# Patient Record
Sex: Female | Born: 1940
Health system: Southern US, Community
[De-identification: ages and names within clinical notes are randomized; demographics above are authoritative.]

## PROBLEM LIST (undated history)

## (undated) DIAGNOSIS — J302 Other seasonal allergic rhinitis: Secondary | ICD-10-CM

## (undated) DIAGNOSIS — E119 Type 2 diabetes mellitus without complications: Secondary | ICD-10-CM

## (undated) DIAGNOSIS — J309 Allergic rhinitis, unspecified: Secondary | ICD-10-CM

## (undated) DIAGNOSIS — R011 Cardiac murmur, unspecified: Secondary | ICD-10-CM

## (undated) DIAGNOSIS — R7301 Impaired fasting glucose: Secondary | ICD-10-CM

## (undated) DIAGNOSIS — M159 Polyosteoarthritis, unspecified: Secondary | ICD-10-CM

## (undated) DIAGNOSIS — C801 Malignant (primary) neoplasm, unspecified: Secondary | ICD-10-CM

## (undated) DIAGNOSIS — I10 Essential (primary) hypertension: Secondary | ICD-10-CM

## (undated) DIAGNOSIS — E039 Hypothyroidism, unspecified: Secondary | ICD-10-CM

## (undated) DIAGNOSIS — Z9289 Personal history of other medical treatment: Secondary | ICD-10-CM

## (undated) DIAGNOSIS — K219 Gastro-esophageal reflux disease without esophagitis: Secondary | ICD-10-CM

## (undated) DIAGNOSIS — E785 Hyperlipidemia, unspecified: Secondary | ICD-10-CM

## (undated) HISTORY — PX: ESOPHAGOGASTRODUODENOSCOPY: SHX1529

## (undated) HISTORY — DX: Polyosteoarthritis, unspecified: M15.9

## (undated) HISTORY — PX: COLONOSCOPY: SHX174

## (undated) HISTORY — DX: Gastro-esophageal reflux disease without esophagitis: K21.9

## (undated) HISTORY — DX: Allergic rhinitis, unspecified: J30.9

## (undated) HISTORY — PX: OTHER SURGICAL HISTORY: SHX169

## (undated) HISTORY — DX: Hyperlipidemia, unspecified: E78.5

## (undated) HISTORY — DX: Impaired fasting glucose: R73.01

## (undated) HISTORY — DX: Hypothyroidism, unspecified: E03.9

## (undated) HISTORY — PX: BREAST EXCISIONAL BIOPSY: SUR124

## (undated) HISTORY — DX: Essential (primary) hypertension: I10

---
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
## 1979-04-05 HISTORY — PX: ABDOMINAL HYSTERECTOMY: SHX81

## 1983-04-05 HISTORY — PX: BREAST SURGERY: SHX581

## 2004-04-07 ENCOUNTER — Ambulatory Visit: Payer: Self-pay | Admitting: Unknown Physician Specialty

## 2004-10-12 ENCOUNTER — Ambulatory Visit: Payer: Self-pay | Admitting: Internal Medicine

## 2005-12-01 ENCOUNTER — Ambulatory Visit: Payer: Self-pay | Admitting: Internal Medicine

## 2005-12-12 ENCOUNTER — Ambulatory Visit: Payer: Self-pay | Admitting: Internal Medicine

## 2006-05-25 ENCOUNTER — Ambulatory Visit: Payer: Self-pay | Admitting: Internal Medicine

## 2007-05-07 ENCOUNTER — Ambulatory Visit: Payer: Self-pay | Admitting: Internal Medicine

## 2008-06-10 ENCOUNTER — Ambulatory Visit: Payer: Self-pay | Admitting: Internal Medicine

## 2009-07-09 ENCOUNTER — Ambulatory Visit: Payer: Self-pay | Admitting: Internal Medicine

## 2010-06-28 ENCOUNTER — Ambulatory Visit: Payer: Self-pay | Admitting: Unknown Physician Specialty

## 2010-06-30 LAB — PATHOLOGY REPORT

## 2010-07-13 ENCOUNTER — Ambulatory Visit: Payer: Self-pay | Admitting: Internal Medicine

## 2011-01-03 LAB — HM MAMMOGRAPHY: HM Mammogram: NORMAL

## 2011-01-20 LAB — HM COLONOSCOPY: HM Colonoscopy: NORMAL

## 2011-04-11 DIAGNOSIS — J209 Acute bronchitis, unspecified: Secondary | ICD-10-CM | POA: Diagnosis not present

## 2011-05-23 ENCOUNTER — Encounter: Payer: Self-pay | Admitting: Internal Medicine

## 2011-05-23 ENCOUNTER — Ambulatory Visit (INDEPENDENT_AMBULATORY_CARE_PROVIDER_SITE_OTHER): Payer: Medicare Other | Admitting: Internal Medicine

## 2011-05-23 VITALS — BP 120/70 | HR 65 | Temp 97.8°F | Ht 64.5 in | Wt 184.0 lb

## 2011-05-23 DIAGNOSIS — E785 Hyperlipidemia, unspecified: Secondary | ICD-10-CM

## 2011-05-23 DIAGNOSIS — I10 Essential (primary) hypertension: Secondary | ICD-10-CM | POA: Diagnosis not present

## 2011-05-23 DIAGNOSIS — K219 Gastro-esophageal reflux disease without esophagitis: Secondary | ICD-10-CM

## 2011-05-23 DIAGNOSIS — E039 Hypothyroidism, unspecified: Secondary | ICD-10-CM | POA: Insufficient documentation

## 2011-05-23 DIAGNOSIS — R7301 Impaired fasting glucose: Secondary | ICD-10-CM | POA: Diagnosis not present

## 2011-05-23 DIAGNOSIS — J309 Allergic rhinitis, unspecified: Secondary | ICD-10-CM | POA: Insufficient documentation

## 2011-05-23 DIAGNOSIS — Z23 Encounter for immunization: Secondary | ICD-10-CM

## 2011-05-23 DIAGNOSIS — R7303 Prediabetes: Secondary | ICD-10-CM | POA: Insufficient documentation

## 2011-05-23 DIAGNOSIS — M159 Polyosteoarthritis, unspecified: Secondary | ICD-10-CM | POA: Insufficient documentation

## 2011-05-23 MED ORDER — LEVOCETIRIZINE DIHYDROCHLORIDE 5 MG PO TABS: 5.0000 mg | ORAL_TABLET | Freq: Every evening | ORAL | Status: DC

## 2011-05-23 MED ORDER — LEVOTHYROXINE SODIUM 75 MCG PO TABS: 75.0000 ug | ORAL_TABLET | Freq: Every day | ORAL | Status: DC

## 2011-05-23 MED ORDER — HYDROCHLOROTHIAZIDE 25 MG PO TABS: 25.0000 mg | ORAL_TABLET | Freq: Every day | ORAL | Status: DC

## 2011-05-23 MED ORDER — BENZONATATE 100 MG PO CAPS: 100.0000 mg | ORAL_CAPSULE | Freq: Three times a day (TID) | ORAL | Status: DC | PRN

## 2011-05-23 MED ORDER — METFORMIN HCL 500 MG PO TABS: 500.0000 mg | ORAL_TABLET | Freq: Two times a day (BID) | ORAL | Status: DC

## 2011-05-23 MED ORDER — ATORVASTATIN CALCIUM 80 MG PO TABS: 80.0000 mg | ORAL_TABLET | Freq: Every day | ORAL | Status: DC

## 2011-05-23 MED ORDER — OMEPRAZOLE 40 MG PO CPDR: 40.0000 mg | DELAYED_RELEASE_CAPSULE | Freq: Every day | ORAL | Status: DC

## 2011-05-23 MED ORDER — VALSARTAN 320 MG PO TABS: 320.0000 mg | ORAL_TABLET | Freq: Every day | ORAL | Status: DC

## 2011-05-23 NOTE — Assessment & Plan Note (Signed)
Feels the aleve does more Will stop the meloxicam

## 2011-05-23 NOTE — Assessment & Plan Note (Signed)
Does okay on the med Worse last year so should continue

## 2011-05-23 NOTE — Assessment & Plan Note (Signed)
Tolerates the med Will check labs

## 2011-05-23 NOTE — Progress Notes (Signed)
Subjective:    Patient ID: Sandra Montgomery, female    DOB: 03-Sep-1940, 71 y.o.   MRN: [id number]  HPI Transferring care from Dr Dario Guardian  Has had long standing hypertension Does fine on the meds  High cholesterol as well Tolerates the med okay---does note some arm weakness and pain May have some degree of arthritis as well----takes the meloxicam every day though didn't know what it was for Goes to chiropractor at times Knees and shoulders mainly for pain  Allergy issues Controlled with daily med  Prone to bronchitis Never smoker No persistence to suggest chronic disease Uses benzonatate for cough  Takes thyroid med Believes it is underactive but not sure  Ongoing reflux Worsened recently Had EGD and colon by Dr Mechele Collin which was okay  No current outpatient prescriptions on file prior to visit.    No Known Allergies  Past Medical History  Diagnosis Date  . Hypertension   . Hyperlipidemia   . Allergic rhinitis, cause unspecified   . Unspecified hypothyroidism   . Impaired fasting glucose   . Osteoarthrosis involving, or with mention of more than one site, but not specified as generalized, multiple sites   . GERD (gastroesophageal reflux disease)   . Pneumothorax 1965    prolonged bed rest     Past Surgical History  Procedure Date  . Tonsillectomy and adenoidectomy 1961  . Abdominal hysterectomy 1981  . Breast surgery 1985    non cancerous mass removed    Family History  Problem Relation Age of Onset  . Alzheimer's disease Mother   . Hyperlipidemia Father   . Heart disease Father     History   Social History  . Marital Status: Married    Spouse Name: N/A    Number of Children: 2  . Years of Education: N/A   Occupational History  . BOOKKEEPER    Social History Main Topics  . Smoking status: Never Smoker   . Smokeless tobacco: Never Used  . Alcohol Use: No  . Drug Use: No  . Sexually Active: Not on file   Other Topics Concern  . Not on  file   Social History Narrative   Still works dailyNo living will Requests daughter Elnita Maxwell to be health care POA (along with husband)Would accept attempts at CPR but no prolonged ventilation. Would accept feeding tube temporarily   Review of Systems  Constitutional: Negative for fatigue and unexpected weight change.       Active at work but no exercise  HENT: Negative for hearing loss and tinnitus.   Eyes: Negative for visual disturbance.       Had left corneal repair last year Cataract surgery on hold Needs magnifying glass for reading  Respiratory: Negative for cough, chest tightness and shortness of breath.   Cardiovascular: Negative for chest pain and leg swelling.  Gastrointestinal: Negative for nausea, vomiting, abdominal pain and constipation.  Genitourinary: Positive for urgency. Negative for dysuria and difficulty urinating.       Uses pad due to mild urge incontinence  Musculoskeletal: Positive for arthralgias. Negative for back pain and joint swelling.  Neurological: Negative for dizziness, light-headedness and headaches.       Migraines in past---not a current problem  Hematological: Negative for adenopathy. Does not bruise/bleed easily.  Psychiatric/Behavioral: Negative for sleep disturbance and dysphoric mood. The patient is not nervous/anxious.        Objective:   Physical Exam  Constitutional: She appears well-developed and well-nourished. No distress.  HENT:  Mouth/Throat:  Oropharynx is clear and moist. No oropharyngeal exudate.  Eyes: Conjunctivae and EOM are normal. Pupils are equal, round, and reactive to light.  Neck: Normal range of motion. Neck supple. No thyromegaly present.  Cardiovascular: Normal rate, regular rhythm, normal heart sounds and intact distal pulses.  Exam reveals no gallop.   No murmur heard. Pulmonary/Chest: Effort normal and breath sounds normal. No respiratory distress. She has no wheezes. She has no rales.  Abdominal: Soft. There is no  tenderness.  Musculoskeletal: She exhibits no edema and no tenderness.  Lymphadenopathy:    She has no cervical adenopathy.  Skin: No rash noted. No erythema.  Psychiatric: She has a normal mood and affect. Her behavior is normal. Judgment and thought content normal.          Assessment & Plan:

## 2011-05-23 NOTE — Assessment & Plan Note (Signed)
BP Readings from Last 3 Encounters:  05/23/11 120/70   Good control Will continue current meds Will check labs

## 2011-05-23 NOTE — Patient Instructions (Signed)
Please get last 3 years of records/labs/consults and studies--from Dr Dario Guardian

## 2011-05-23 NOTE — Assessment & Plan Note (Signed)
She debates the diagnosis of diabetes States her sugar was up to 112 once and he put her on metformin Will check labs If normal, can consider stopping the metformin

## 2011-05-23 NOTE — Assessment & Plan Note (Signed)
Will check labs

## 2011-05-24 LAB — CBC WITH DIFFERENTIAL/PLATELET
Eosinophils Relative: 3 % (ref 0.0–5.0)
HCT: 40.9 % (ref 36.0–46.0)
Hemoglobin: 13.5 g/dL (ref 12.0–15.0)
Lymphs Abs: 2.5 10*3/uL (ref 0.7–4.0)
MCV: 89.8 fl (ref 78.0–100.0)
Monocytes Absolute: 0.5 10*3/uL (ref 0.1–1.0)
Monocytes Relative: 5.9 % (ref 3.0–12.0)
Neutro Abs: 4.9 10*3/uL (ref 1.4–7.7)
Platelets: 214 10*3/uL (ref 150.0–400.0)
WBC: 8.1 10*3/uL (ref 4.5–10.5)

## 2011-05-24 LAB — HEPATIC FUNCTION PANEL
ALT: 28 U/L (ref 0–35)
AST: 19 U/L (ref 0–37)
Albumin: 4.4 g/dL (ref 3.5–5.2)
Alkaline Phosphatase: 66 U/L (ref 39–117)
Total Protein: 6.9 g/dL (ref 6.0–8.3)

## 2011-05-24 LAB — LIPID PANEL
Cholesterol: 193 mg/dL (ref 0–200)
LDL Cholesterol: 112 mg/dL — ABNORMAL HIGH (ref 0–99)
Total CHOL/HDL Ratio: 3
Triglycerides: 121 mg/dL (ref 0.0–149.0)

## 2011-05-24 LAB — BASIC METABOLIC PANEL
BUN: 22 mg/dL (ref 6–23)
Chloride: 102 mEq/L (ref 96–112)
GFR: 56.45 mL/min — ABNORMAL LOW (ref >60.00)
Glucose, Bld: 80 mg/dL (ref 70–99)
Potassium: 4.2 mEq/L (ref 3.5–5.1)
Sodium: 139 mEq/L (ref 135–145)

## 2011-05-26 ENCOUNTER — Encounter: Payer: Self-pay | Admitting: *Deleted

## 2011-06-15 DIAGNOSIS — H179 Unspecified corneal scar and opacity: Secondary | ICD-10-CM | POA: Diagnosis not present

## 2011-06-20 ENCOUNTER — Other Ambulatory Visit: Payer: Self-pay | Admitting: Internal Medicine

## 2011-06-21 NOTE — Telephone Encounter (Signed)
rx sent to pharmacy by e-script Spoke with patient and advised results   

## 2011-06-21 NOTE — Telephone Encounter (Signed)
Okay to refill #90 x 0 Will need to discuss this at next visit---could be related to her BP med

## 2011-06-21 NOTE — Telephone Encounter (Signed)
Called pt to ask why she needs the refill on Benzonatate and per pt she takes this every morning and if she doesn't take it she can tell, her cough gets worse, per pt she's been taking benzonatate and the syrup(sometimes) for 2-3 years. Please advise if refill ok.

## 2011-09-07 DIAGNOSIS — J018 Other acute sinusitis: Secondary | ICD-10-CM | POA: Diagnosis not present

## 2011-10-01 ENCOUNTER — Other Ambulatory Visit: Payer: Self-pay | Admitting: Internal Medicine

## 2011-11-21 ENCOUNTER — Encounter: Payer: Self-pay | Admitting: Internal Medicine

## 2011-11-21 ENCOUNTER — Ambulatory Visit (INDEPENDENT_AMBULATORY_CARE_PROVIDER_SITE_OTHER): Payer: Medicare Other | Admitting: Internal Medicine

## 2011-11-21 VITALS — BP 120/88 | HR 82 | Temp 97.5°F | Ht 64.0 in | Wt 184.0 lb

## 2011-11-21 DIAGNOSIS — M159 Polyosteoarthritis, unspecified: Secondary | ICD-10-CM

## 2011-11-21 DIAGNOSIS — I10 Essential (primary) hypertension: Secondary | ICD-10-CM

## 2011-11-21 DIAGNOSIS — M810 Age-related osteoporosis without current pathological fracture: Secondary | ICD-10-CM | POA: Insufficient documentation

## 2011-11-21 DIAGNOSIS — E785 Hyperlipidemia, unspecified: Secondary | ICD-10-CM

## 2011-11-21 MED ORDER — BENZONATATE 100 MG PO CAPS: 100.0000 mg | ORAL_CAPSULE | Freq: Three times a day (TID) | ORAL | Status: DC | PRN

## 2011-11-21 NOTE — Assessment & Plan Note (Signed)
Uses the meloxicam daily PPI for stomach and gastroprotection

## 2011-11-21 NOTE — Progress Notes (Signed)
Subjective:    Patient ID: Sandra Montgomery, female    DOB: June 18, 1940, 71 y.o.   MRN: [id number]  HPI Doing okay  Still has allergies Chronic cough---does get relief from benzonatate Relates this to post nasal drip Affects her voice Uses the med every morning  No chest pain No SOB No dizziness or syncope No edema  Doing fine on the cholesterol med No myalgias  Discussed weight  Has done weight watchers and [facility] but not kept up with it Belongs to gym but hasn't been going Sedentary at work  Current Outpatient Prescriptions on File Prior to Visit  Medication Sig Dispense Refill  . aspirin 81 MG tablet Take 81 mg by mouth daily.      Marland Kitchen atorvastatin (LIPITOR) 80 MG tablet Take 1 tablet (80 mg total) by mouth daily.  30 tablet  11  . benzonatate (TESSALON) 100 MG capsule TAKE ONE CAPSULE BY MOUTH 3 TIMES A DAY AS NEEDED FOR COUGH  90 capsule  0  . hydrochlorothiazide (HYDRODIURIL) 25 MG tablet Take 1 tablet (25 mg total) by mouth daily.  30 tablet  11  . ibandronate (BONIVA) 150 MG tablet TAKE 1 TABLET BY MOUTH ONCE EVERY MONTH  3 tablet  3  . levocetirizine (XYZAL) 5 MG tablet Take 1 tablet (5 mg total) by mouth every evening.  30 tablet  11  . levothyroxine (SYNTHROID, LEVOTHROID) 75 MCG tablet Take 1 tablet (75 mcg total) by mouth daily.  30 tablet  11  . metFORMIN (GLUCOPHAGE) 500 MG tablet Take 1 tablet (500 mg total) by mouth 2 (two) times daily with a meal.  60 tablet  11  . naproxen sodium (ANAPROX) 220 MG tablet Take 220 mg by mouth 2 (two) times daily with a meal.      . omeprazole (PRILOSEC) 40 MG capsule Take 1 capsule (40 mg total) by mouth daily.  30 capsule  11  . valsartan (DIOVAN) 320 MG tablet Take 1 tablet (320 mg total) by mouth daily.  30 tablet  11    No Known Allergies  Past Medical History  Diagnosis Date  . Hypertension   . Hyperlipidemia   . Allergic rhinitis, cause unspecified   . Unspecified hypothyroidism   . Impaired fasting glucose    . Osteoarthrosis involving, or with mention of more than one site, but not specified as generalized, multiple sites   . GERD (gastroesophageal reflux disease)   . Pneumothorax 1965    prolonged bed rest     Past Surgical History  Procedure Date  . Tonsillectomy and adenoidectomy 1961  . Abdominal hysterectomy 1981  . Breast surgery 1985    non cancerous mass removed    Family History  Problem Relation Age of Onset  . Alzheimer's disease Mother   . Hyperlipidemia Father   . Heart disease Father     History   Social History  . Marital Status: Married    Spouse Name: N/A    Number of Children: 2  . Years of Education: N/A   Occupational History  . BOOKKEEPER    Social History Main Topics  . Smoking status: Never Smoker   . Smokeless tobacco: Never Used  . Alcohol Use: No  . Drug Use: No  . Sexually Active: Not on file   Other Topics Concern  . Not on file   Social History Narrative   Still works dailyNo living will Requests daughter Elnita Maxwell to be health care POA (along with husband)Would accept attempts  at CPR but no prolonged ventilation. Would accept feeding tube temporarily   Review of Systems Weight is stable Sleeps variably----initiates fine. Some trouble maintaining    Objective:   Physical Exam  Constitutional: She appears well-developed and well-nourished. No distress.  Neck: Normal range of motion. Neck supple. No thyromegaly present.  Cardiovascular: Normal rate, regular rhythm, normal heart sounds and intact distal pulses.  Exam reveals no gallop.   No murmur heard. Pulmonary/Chest: Effort normal and breath sounds normal. No respiratory distress. She has no wheezes. She has no rales.  Musculoskeletal: She exhibits no edema and no tenderness.  Lymphadenopathy:    She has no cervical adenopathy.  Psychiatric: She has a normal mood and affect. Her behavior is normal. Thought content normal.          Assessment & Plan:

## 2011-11-21 NOTE — Assessment & Plan Note (Signed)
Close to goal No changes for now Discussed fitness  Lab Results  Component Value Date   LDLCALC 112* 05/23/2011

## 2011-11-21 NOTE — Assessment & Plan Note (Signed)
BP Readings from Last 3 Encounters:  11/21/11 120/88  05/23/11 120/70   Good control No changes needed Lab Results  Component Value Date   CREATININE 1.0 05/23/2011

## 2012-01-16 DIAGNOSIS — L821 Other seborrheic keratosis: Secondary | ICD-10-CM | POA: Diagnosis not present

## 2012-01-16 DIAGNOSIS — L909 Atrophic disorder of skin, unspecified: Secondary | ICD-10-CM | POA: Diagnosis not present

## 2012-01-16 DIAGNOSIS — L919 Hypertrophic disorder of the skin, unspecified: Secondary | ICD-10-CM | POA: Diagnosis not present

## 2012-01-16 DIAGNOSIS — L820 Inflamed seborrheic keratosis: Secondary | ICD-10-CM | POA: Diagnosis not present

## 2012-01-18 ENCOUNTER — Ambulatory Visit (INDEPENDENT_AMBULATORY_CARE_PROVIDER_SITE_OTHER): Payer: Medicare Other

## 2012-01-18 DIAGNOSIS — Z23 Encounter for immunization: Secondary | ICD-10-CM

## 2012-01-23 ENCOUNTER — Other Ambulatory Visit: Payer: Self-pay | Admitting: Internal Medicine

## 2012-01-26 ENCOUNTER — Other Ambulatory Visit: Payer: Self-pay | Admitting: *Deleted

## 2012-01-26 MED ORDER — ATORVASTATIN CALCIUM 80 MG PO TABS: 80.0000 mg | ORAL_TABLET | Freq: Every day | ORAL | Status: DC

## 2012-02-08 DIAGNOSIS — H179 Unspecified corneal scar and opacity: Secondary | ICD-10-CM | POA: Diagnosis not present

## 2012-02-18 DIAGNOSIS — R059 Cough, unspecified: Secondary | ICD-10-CM | POA: Diagnosis not present

## 2012-02-18 DIAGNOSIS — J309 Allergic rhinitis, unspecified: Secondary | ICD-10-CM | POA: Diagnosis not present

## 2012-02-18 DIAGNOSIS — R05 Cough: Secondary | ICD-10-CM | POA: Diagnosis not present

## 2012-02-18 DIAGNOSIS — J209 Acute bronchitis, unspecified: Secondary | ICD-10-CM | POA: Diagnosis not present

## 2012-02-20 DIAGNOSIS — L820 Inflamed seborrheic keratosis: Secondary | ICD-10-CM | POA: Diagnosis not present

## 2012-02-20 DIAGNOSIS — L821 Other seborrheic keratosis: Secondary | ICD-10-CM | POA: Diagnosis not present

## 2012-04-02 DIAGNOSIS — L821 Other seborrheic keratosis: Secondary | ICD-10-CM | POA: Diagnosis not present

## 2012-04-02 DIAGNOSIS — Z85828 Personal history of other malignant neoplasm of skin: Secondary | ICD-10-CM | POA: Diagnosis not present

## 2012-04-02 DIAGNOSIS — L820 Inflamed seborrheic keratosis: Secondary | ICD-10-CM | POA: Diagnosis not present

## 2012-05-22 ENCOUNTER — Other Ambulatory Visit: Payer: Self-pay | Admitting: *Deleted

## 2012-05-22 MED ORDER — LEVOTHYROXINE SODIUM 75 MCG PO TABS: 75.0000 ug | ORAL_TABLET | Freq: Every day | ORAL | Status: DC

## 2012-05-22 MED ORDER — LEVOCETIRIZINE DIHYDROCHLORIDE 5 MG PO TABS: 5.0000 mg | ORAL_TABLET | Freq: Every evening | ORAL | Status: DC

## 2012-05-22 NOTE — Addendum Note (Signed)
Addended by: Baldomero Lamy on: 05/22/2012 11:36 AM   Modules accepted: Orders

## 2012-06-01 ENCOUNTER — Encounter: Payer: Medicare Other | Admitting: Internal Medicine

## 2012-06-08 ENCOUNTER — Encounter: Payer: Medicare Other | Admitting: Internal Medicine

## 2012-06-14 ENCOUNTER — Other Ambulatory Visit: Payer: Self-pay | Admitting: Internal Medicine

## 2012-07-17 ENCOUNTER — Other Ambulatory Visit: Payer: Self-pay | Admitting: Internal Medicine

## 2012-07-17 DIAGNOSIS — M999 Biomechanical lesion, unspecified: Secondary | ICD-10-CM | POA: Diagnosis not present

## 2012-07-17 DIAGNOSIS — IMO0002 Reserved for concepts with insufficient information to code with codable children: Secondary | ICD-10-CM | POA: Diagnosis not present

## 2012-07-17 DIAGNOSIS — M5137 Other intervertebral disc degeneration, lumbosacral region: Secondary | ICD-10-CM | POA: Diagnosis not present

## 2012-07-27 ENCOUNTER — Ambulatory Visit (INDEPENDENT_AMBULATORY_CARE_PROVIDER_SITE_OTHER): Payer: Medicare Other | Admitting: Internal Medicine

## 2012-07-27 ENCOUNTER — Encounter: Payer: Self-pay | Admitting: Internal Medicine

## 2012-07-27 VITALS — BP 110/80 | HR 74 | Temp 98.1°F | Wt 188.0 lb

## 2012-07-27 DIAGNOSIS — R7301 Impaired fasting glucose: Secondary | ICD-10-CM | POA: Diagnosis not present

## 2012-07-27 DIAGNOSIS — R413 Other amnesia: Secondary | ICD-10-CM

## 2012-07-27 DIAGNOSIS — E785 Hyperlipidemia, unspecified: Secondary | ICD-10-CM | POA: Diagnosis not present

## 2012-07-27 DIAGNOSIS — Z0000 Encounter for general adult medical examination without abnormal findings: Secondary | ICD-10-CM | POA: Diagnosis not present

## 2012-07-27 DIAGNOSIS — E039 Hypothyroidism, unspecified: Secondary | ICD-10-CM | POA: Diagnosis not present

## 2012-07-27 DIAGNOSIS — I10 Essential (primary) hypertension: Secondary | ICD-10-CM | POA: Diagnosis not present

## 2012-07-27 DIAGNOSIS — M159 Polyosteoarthritis, unspecified: Secondary | ICD-10-CM

## 2012-07-27 DIAGNOSIS — M810 Age-related osteoporosis without current pathological fracture: Secondary | ICD-10-CM

## 2012-07-27 LAB — CBC WITH DIFFERENTIAL/PLATELET
Basophils Relative: 1 % (ref 0–1)
Eosinophils Absolute: 0.3 10*3/uL (ref 0.0–0.7)
Eosinophils Relative: 3 % (ref 0–5)
Lymphs Abs: 2.6 10*3/uL (ref 0.7–4.0)
MCH: 28.7 pg (ref 26.0–34.0)
MCHC: 34 g/dL (ref 30.0–36.0)
MCV: 84.5 fL (ref 78.0–100.0)
Monocytes Relative: 8 % (ref 3–12)
Neutrophils Relative %: 58 % (ref 43–77)
Platelets: 266 10*3/uL (ref 150–400)
RBC: 4.7 MIL/uL (ref 3.87–5.11)

## 2012-07-27 NOTE — Assessment & Plan Note (Signed)
Lab Results  Component Value Date   HGBA1C 6.4 05/23/2011   Will recheck  Consider stopping the metformin

## 2012-07-27 NOTE — Assessment & Plan Note (Signed)
On 2 different NSAIDs at low dose Discussed changing to just one--she isn't excited about it

## 2012-07-27 NOTE — Assessment & Plan Note (Addendum)
On boniva for about 3 years or so Will plan to stop in 2016 Needs to start vitamin D

## 2012-07-27 NOTE — Assessment & Plan Note (Signed)
BP Readings from Last 3 Encounters:  07/27/12 110/80  11/21/11 120/88  05/23/11 120/70   Good control No changes needed

## 2012-07-27 NOTE — Patient Instructions (Addendum)
Please set up your mammogram at [facility] Please start vitamin D 1000 int units daily Please try meloxicam 15mg  daily OR 2 aleve twice a day alone---without the other. See how that works.  DASH Diet The DASH diet stands for "Dietary Approaches to Stop Hypertension." It is a healthy eating plan that has been shown to reduce high blood pressure (hypertension) in as little as 14 days, while also possibly providing other significant health benefits. These other health benefits include reducing the risk of breast cancer after menopause and reducing the risk of type 2 diabetes, heart disease, colon cancer, and stroke. Health benefits also include weight loss and slowing kidney failure in patients with chronic kidney disease.  DIET GUIDELINES  Limit salt (sodium). Your diet should contain less than 1500 mg of sodium daily.  Limit refined or processed carbohydrates. Your diet should include mostly whole grains. Desserts and added sugars should be used sparingly.  Include small amounts of heart-healthy fats. These types of fats include nuts, oils, and tub margarine. Limit saturated and trans fats. These fats have been shown to be harmful in the body. CHOOSING FOODS  The following food groups are based on a 2000 calorie diet. See your Registered Dietitian for individual calorie needs. Grains and Grain Products (6 to 8 servings daily)  Eat More Often: Whole-wheat bread, brown rice, whole-grain or wheat pasta, quinoa, popcorn without added fat or salt (air popped).  Eat Less Often: White bread, white pasta, white rice, cornbread. Vegetables (4 to 5 servings daily)  Eat More Often: Fresh, frozen, and canned vegetables. Vegetables may be raw, steamed, roasted, or grilled with a minimal amount of fat.  Eat Less Often/Avoid: Creamed or fried vegetables. Vegetables in a cheese sauce. Fruit (4 to 5 servings daily)  Eat More Often: All fresh, canned (in natural juice), or frozen fruits. Dried fruits without added  sugar. One hundred percent fruit juice ( cup [237 mL] daily).  Eat Less Often: Dried fruits with added sugar. Canned fruit in light or heavy syrup. [employer], Fish, and Poultry (2 servings or less daily. One serving is 3 to 4 oz [85-114 g]).  Eat More Often: Ninety percent or leaner ground beef, tenderloin, sirloin. Round cuts of beef, chicken breast, [place] breast. All fish. Grill, bake, or broil your meat. Nothing should be fried.  Eat Less Often/Avoid: Fatty cuts of meat, [place], or chicken leg, thigh, or wing. Fried cuts of meat or fish. Dairy (2 to 3 servings)  Eat More Often: Low-fat or fat-free milk, low-fat plain or light yogurt, reduced-fat or part-skim cheese.  Eat Less Often/Avoid: Milk (whole, 2%).Whole milk yogurt. Full-fat cheeses. Nuts, Seeds, and Legumes (4 to 5 servings per week)  Eat More Often: All without added salt.  Eat Less Often/Avoid: Salted nuts and seeds, canned beans with added salt. Fats and Sweets (limited)  Eat More Often: Vegetable oils, tub margarines without trans fats, sugar-free gelatin. Mayonnaise and salad dressings.  Eat Less Often/Avoid: Coconut oils, palm oils, butter, stick margarine, cream, half and half, cookies, candy, pie. FOR MORE INFORMATION The Dash Diet Eating Plan: www.dashdiet.org Document Released: 03/10/2011 Document Revised: 06/13/2011 Document Reviewed: 03/10/2011 [facility] Patient Information 2013 [place], [place].

## 2012-07-27 NOTE — Assessment & Plan Note (Signed)
Due for labs Seems euthyroid 

## 2012-07-27 NOTE — Assessment & Plan Note (Signed)
I have personally reviewed the Medicare Annual Wellness questionnaire and have noted 1. The patient's medical and social history 2. Their use of alcohol, tobacco or illicit drugs 3. Their current medications and supplements 4. The patient's functional ability including ADL's, fall risks, home safety risks and hearing or visual             impairment. 5. Diet and physical activities 6. Evidence for depression or mood disorders  The patients weight, height, BMI and visual acuity have been recorded in the chart I have made referrals, counseling and provided education to the patient based review of the above and I have provided the pt with a written personalized care plan for preventive services.  I have provided you with a copy of your personalized plan for preventive services. Please take the time to review along with your updated medication list.  Mild memory issues---we will follow this over time Check labs including B12 Rx for zostavax She will set up the mammogram

## 2012-07-27 NOTE — Progress Notes (Signed)
Subjective:    Patient ID: Sandra Montgomery, female    DOB: 14-Dec-1940, 72 y.o.   MRN: [id number]  HPI Here for Medicare wellness and follow up visit Reviewed advanced directives No falls No depression or anhedonia Some worsening of vision---corneal damage and growing cataract on left. Does okay with both together Hearing okay No real exercise No tobacco or alcohol No other doctors except episodic chiropractor Mild memory changes but no functional problems  Doesn't check sugars Has kept up on the metformin Weight is stable  Ongoing arthritis pain Knee is worst Sees chiropractor  Takes lower dose of meloxicam and naproxen together  No chest pain  Decreased respiratory reserve due to past pneumothorax Limited exercise tolerance without change No dizziness or syncope  Still on omeprazole No heartburn No swallowing problems  Same thyroid med  Current Outpatient Prescriptions on File Prior to Visit  Medication Sig Dispense Refill  . aspirin 81 MG tablet Take 81 mg by mouth daily.      Marland Kitchen atorvastatin (LIPITOR) 80 MG tablet Take 1 tablet (80 mg total) by mouth daily.  90 tablet  1  . benzonatate (TESSALON) 100 MG capsule Take 1 capsule (100 mg total) by mouth 3 (three) times daily as needed for cough.  90 capsule  1  . DIOVAN 320 MG tablet TAKE 1 TABLET BY MOUTH EVERY DAY  30 tablet  0  . hydrochlorothiazide (HYDRODIURIL) 25 MG tablet TAKE 1 TABLET BY MOUTH EVERY DAY  30 tablet  0  . ibandronate (BONIVA) 150 MG tablet TAKE 1 TABLET BY MOUTH ONCE EVERY MONTH  3 tablet  3  . levocetirizine (XYZAL) 5 MG tablet Take 1 tablet (5 mg total) by mouth every evening.  30 tablet  5  . levothyroxine (SYNTHROID, LEVOTHROID) 75 MCG tablet TAKE 1 TABLET BY MOUTH ONCE A DAY  30 tablet  0  . meloxicam (MOBIC) 7.5 MG tablet Take 7.5 mg by mouth daily.      . metFORMIN (GLUCOPHAGE) 500 MG tablet TAKE 1 TABLET BY MOUTH TWICE A DAY WITH MEALS  60 tablet  0  . naproxen sodium (ANAPROX) 220 MG  tablet Take 220 mg by mouth 2 (two) times daily with a meal.      . omeprazole (PRILOSEC) 40 MG capsule TAKE ONE CAPSULE BY MOUTH EVERY DAY  30 capsule  0   No current facility-administered medications on file prior to visit.    No Known Allergies  Past Medical History  Diagnosis Date  . Hypertension   . Hyperlipidemia   . Allergic rhinitis, cause unspecified   . Unspecified hypothyroidism   . Impaired fasting glucose   . Osteoarthrosis involving, or with mention of more than one site, but not specified as generalized, multiple sites   . GERD (gastroesophageal reflux disease)   . Pneumothorax 1965    prolonged bed rest   . Osteoporosis     Past Surgical History  Procedure Laterality Date  . Tonsillectomy and adenoidectomy  1961  . Abdominal hysterectomy  1981  . Breast surgery  1985    non cancerous mass removed    Family History  Problem Relation Age of Onset  . Alzheimer's disease Mother   . Hyperlipidemia Father   . Heart disease Father     History   Social History  . Marital Status: Married    Spouse Name: N/A    Number of Children: 2  . Years of Education: N/A   Occupational History  . [employer]   Social History Main Topics  . Smoking status: Never Smoker   . Smokeless tobacco: Never Used  . Alcohol Use: No  . Drug Use: No  . Sexually Active: Not on file   Other Topics Concern  . Not on file   Social History Narrative   Still works daily   No living will    Requests daughter Elnita Maxwell to be health care POA (along with husband)   Would accept attempts at CPR but no prolonged ventilation.    Would accept feeding tube temporarily   Review of Systems Tries to be careful with eating Weight up 4# since last visit Sleeps well Nocturia x 1 usually Bowels are fine No dysuria but goes frequently. No incontinence if keeps bladder empty. Wears pad just in case.    Objective:   Physical Exam  Constitutional: She is  oriented to person, place, and time. She appears well-developed and well-nourished. No distress.  HENT:  Mouth/Throat: Oropharynx is clear and moist. No oropharyngeal exudate.  Neck: Normal range of motion. Neck supple. No thyromegaly present.  Cardiovascular: Normal rate, regular rhythm, normal heart sounds and intact distal pulses.  Exam reveals no gallop.   No murmur heard. Pulmonary/Chest: Effort normal and breath sounds normal. No respiratory distress. She has no wheezes. She has no rales.  Abdominal: Soft. There is no tenderness.  Musculoskeletal: She exhibits no edema and no tenderness.  Lymphadenopathy:    She has no cervical adenopathy.  Neurological: She is alert and oriented to person, place, and time.  President'-- "Obama, Lura Em" [phone] [id number] Recall 0/3  Skin: No rash noted. No erythema.  Psychiatric: She has a normal mood and affect. Her behavior is normal.          Assessment & Plan:

## 2012-07-27 NOTE — Assessment & Plan Note (Signed)
No problems with the med Will check labs 

## 2012-07-28 LAB — LIPID PANEL
Cholesterol: 190 mg/dL (ref 0–200)
HDL: 46 mg/dL (ref >39)
LDL Cholesterol: 112 mg/dL — ABNORMAL HIGH (ref 0–99)
Triglycerides: 158 mg/dL — ABNORMAL HIGH (ref <150)

## 2012-07-28 LAB — HEPATIC FUNCTION PANEL
ALT: 27 U/L (ref 0–35)
Albumin: 4.3 g/dL (ref 3.5–5.2)
Alkaline Phosphatase: 78 U/L (ref 39–117)
Indirect Bilirubin: 0.5 mg/dL (ref 0.0–0.9)
Total Protein: 6.8 g/dL (ref 6.0–8.3)

## 2012-07-28 LAB — BASIC METABOLIC PANEL
BUN: 21 mg/dL (ref 6–23)
CO2: 29 mEq/L (ref 19–32)
Chloride: 102 mEq/L (ref 96–112)
Creat: 1.09 mg/dL (ref 0.50–1.10)
Potassium: 4.8 mEq/L (ref 3.5–5.3)

## 2012-08-01 ENCOUNTER — Other Ambulatory Visit: Payer: Self-pay

## 2012-08-01 MED ORDER — HYDROCHLOROTHIAZIDE 25 MG PO TABS: ORAL_TABLET | ORAL | Status: DC

## 2012-08-01 MED ORDER — LEVOTHYROXINE SODIUM 75 MCG PO TABS: ORAL_TABLET | ORAL | Status: DC

## 2012-08-01 MED ORDER — LEVOCETIRIZINE DIHYDROCHLORIDE 5 MG PO TABS: 5.0000 mg | ORAL_TABLET | Freq: Every evening | ORAL | Status: DC

## 2012-08-01 MED ORDER — OMEPRAZOLE 40 MG PO CPDR: DELAYED_RELEASE_CAPSULE | ORAL | Status: DC

## 2012-08-01 MED ORDER — MELOXICAM 15 MG PO TABS: 15.0000 mg | ORAL_TABLET | Freq: Every day | ORAL | Status: DC

## 2012-08-01 MED ORDER — BENZONATATE 100 MG PO CAPS: 100.0000 mg | ORAL_CAPSULE | Freq: Three times a day (TID) | ORAL | Status: DC | PRN

## 2012-08-01 MED ORDER — ATORVASTATIN CALCIUM 80 MG PO TABS: 80.0000 mg | ORAL_TABLET | Freq: Every day | ORAL | Status: DC

## 2012-08-01 MED ORDER — METFORMIN HCL 500 MG PO TABS: ORAL_TABLET | ORAL | Status: DC

## 2012-08-01 MED ORDER — IBANDRONATE SODIUM 150 MG PO TABS: 150.0000 mg | ORAL_TABLET | ORAL | Status: DC

## 2012-08-01 MED ORDER — VALSARTAN 320 MG PO TABS: ORAL_TABLET | ORAL | Status: DC

## 2012-08-01 NOTE — Telephone Encounter (Signed)
I did tell her to try off the metformin. If her sugars go up off (like consistently over 120 fasting), she should resume. Okay benzonatate #90 x 0---should be used prn I have recommended she take the meloxicam or naproxen but not both Dose should be 15mg  daily if meloxicam or naproxen 220mg  2 tabs bid (okay x 1 year for whichever she prefers)

## 2012-08-01 NOTE — Telephone Encounter (Signed)
Pt request 90 day refills on [facility]; pt has not stopped metformin and wants to clarify with Dr Alphonsus Sias if should stop metformin. Please advise.

## 2012-08-01 NOTE — Telephone Encounter (Signed)
Spoke with patient and advised results rx sent to pharmacy by e-script Patient will take the mobic and not the naproxen.

## 2012-08-20 ENCOUNTER — Other Ambulatory Visit: Payer: Self-pay | Admitting: Internal Medicine

## 2012-09-08 ENCOUNTER — Other Ambulatory Visit: Payer: Self-pay | Admitting: Internal Medicine

## 2012-10-15 ENCOUNTER — Other Ambulatory Visit: Payer: Self-pay | Admitting: Internal Medicine

## 2012-11-22 DIAGNOSIS — H40009 Preglaucoma, unspecified, unspecified eye: Secondary | ICD-10-CM | POA: Diagnosis not present

## 2012-12-12 ENCOUNTER — Other Ambulatory Visit: Payer: Self-pay | Admitting: Internal Medicine

## 2013-01-04 DIAGNOSIS — M6200 Separation of muscle (nontraumatic), unspecified site: Secondary | ICD-10-CM | POA: Diagnosis not present

## 2013-01-04 DIAGNOSIS — K449 Diaphragmatic hernia without obstruction or gangrene: Secondary | ICD-10-CM | POA: Diagnosis not present

## 2013-01-04 DIAGNOSIS — M549 Dorsalgia, unspecified: Secondary | ICD-10-CM | POA: Diagnosis not present

## 2013-01-28 ENCOUNTER — Other Ambulatory Visit: Payer: Self-pay | Admitting: Internal Medicine

## 2013-01-28 NOTE — Telephone Encounter (Signed)
Ok to fill 

## 2013-01-29 NOTE — Telephone Encounter (Signed)
rx sent to pharmacy by e-script  

## 2013-01-29 NOTE — Telephone Encounter (Signed)
Okay #90 x 0 

## 2013-02-07 ENCOUNTER — Other Ambulatory Visit: Payer: Self-pay

## 2013-03-11 DIAGNOSIS — M999 Biomechanical lesion, unspecified: Secondary | ICD-10-CM | POA: Diagnosis not present

## 2013-03-11 DIAGNOSIS — IMO0002 Reserved for concepts with insufficient information to code with codable children: Secondary | ICD-10-CM | POA: Diagnosis not present

## 2013-03-11 DIAGNOSIS — M9981 Other biomechanical lesions of cervical region: Secondary | ICD-10-CM | POA: Diagnosis not present

## 2013-03-11 DIAGNOSIS — M5030 Other cervical disc degeneration, unspecified cervical region: Secondary | ICD-10-CM | POA: Diagnosis not present

## 2013-03-15 DIAGNOSIS — IMO0002 Reserved for concepts with insufficient information to code with codable children: Secondary | ICD-10-CM | POA: Diagnosis not present

## 2013-03-15 DIAGNOSIS — M999 Biomechanical lesion, unspecified: Secondary | ICD-10-CM | POA: Diagnosis not present

## 2013-03-15 DIAGNOSIS — M5030 Other cervical disc degeneration, unspecified cervical region: Secondary | ICD-10-CM | POA: Diagnosis not present

## 2013-03-15 DIAGNOSIS — M9981 Other biomechanical lesions of cervical region: Secondary | ICD-10-CM | POA: Diagnosis not present

## 2013-03-19 DIAGNOSIS — M9981 Other biomechanical lesions of cervical region: Secondary | ICD-10-CM | POA: Diagnosis not present

## 2013-03-19 DIAGNOSIS — M999 Biomechanical lesion, unspecified: Secondary | ICD-10-CM | POA: Diagnosis not present

## 2013-03-19 DIAGNOSIS — M5030 Other cervical disc degeneration, unspecified cervical region: Secondary | ICD-10-CM | POA: Diagnosis not present

## 2013-03-19 DIAGNOSIS — IMO0002 Reserved for concepts with insufficient information to code with codable children: Secondary | ICD-10-CM | POA: Diagnosis not present

## 2013-03-22 DIAGNOSIS — IMO0002 Reserved for concepts with insufficient information to code with codable children: Secondary | ICD-10-CM | POA: Diagnosis not present

## 2013-03-22 DIAGNOSIS — M999 Biomechanical lesion, unspecified: Secondary | ICD-10-CM | POA: Diagnosis not present

## 2013-03-22 DIAGNOSIS — M9981 Other biomechanical lesions of cervical region: Secondary | ICD-10-CM | POA: Diagnosis not present

## 2013-03-22 DIAGNOSIS — M5030 Other cervical disc degeneration, unspecified cervical region: Secondary | ICD-10-CM | POA: Diagnosis not present

## 2013-04-09 ENCOUNTER — Other Ambulatory Visit: Payer: Self-pay | Admitting: Internal Medicine

## 2013-06-10 ENCOUNTER — Other Ambulatory Visit: Payer: Self-pay | Admitting: Internal Medicine

## 2013-06-20 ENCOUNTER — Other Ambulatory Visit: Payer: Self-pay | Admitting: Internal Medicine

## 2013-08-05 ENCOUNTER — Encounter: Payer: Medicare Other | Admitting: Internal Medicine

## 2013-08-05 DIAGNOSIS — Z0289 Encounter for other administrative examinations: Secondary | ICD-10-CM

## 2013-08-31 ENCOUNTER — Other Ambulatory Visit: Payer: Self-pay | Admitting: Internal Medicine

## 2013-10-30 ENCOUNTER — Encounter: Payer: Self-pay | Admitting: Family Medicine

## 2013-10-30 ENCOUNTER — Telehealth: Payer: Self-pay | Admitting: Internal Medicine

## 2013-10-30 ENCOUNTER — Ambulatory Visit (INDEPENDENT_AMBULATORY_CARE_PROVIDER_SITE_OTHER): Payer: Medicare Other | Admitting: Family Medicine

## 2013-10-30 VITALS — BP 120/80 | HR 72 | Temp 97.9°F | Wt 176.8 lb

## 2013-10-30 DIAGNOSIS — R21 Rash and other nonspecific skin eruption: Secondary | ICD-10-CM | POA: Diagnosis not present

## 2013-10-30 MED ORDER — TRIAMCINOLONE ACETONIDE 0.1 % EX CREA: 1.0000 "application " | TOPICAL_CREAM | Freq: Two times a day (BID) | CUTANEOUS | Status: DC | PRN

## 2013-10-30 NOTE — Patient Instructions (Signed)
Use the cream as needed and this should improve.  Take care.

## 2013-10-30 NOTE — Telephone Encounter (Signed)
Patient Information:  Caller Name: [name]  Phone: [phone]  Patient: Sandra Montgomery, Sandra Montgomery  Gender: Female  DOB: 1941/01/31  Age: 73 Years  PCP: Viviana Simpler [facility])  Office Follow Up:  Does the office need to follow up with this patient?: No  Instructions For The Office: N/A  RN Note:  Call transferred from office for triage. Patient has an appt. Scheduled for 1615 10/30/13.  Patient states she developed a rash and moderate swelling of her right eyelid, onset 0635 10/30/13. Patient states rash has progressed to both sides of her face, neck and her right arm. Patient describes rash as small, raised, red bumps. Denies itching. Denies swelling of lips or tongue. Denies difficulty swallowing. Denies wheezing or difficulty breathing. Afebrile. Patient states she has an appt. already scheduled for 1615 10/30/13 with D.r Damita Dunnings. Care advice given per guidelines. Call back parameters reviewed. Patient advised to keep appt. as scheduled. Patient advised to return call sooner if sx increase. Patient verbalizes understanding and agreeable.  Symptoms  Reason For Call & Symptoms: Rash, eye swelling  Reviewed Health History In EMR: Yes  Reviewed Medications In EMR: Yes  Reviewed Allergies In EMR: Yes  Reviewed Surgeries / Procedures: Yes  Date of Onset of Symptoms: 10/30/2013  Guideline(s) Used:  Rash or Redness - Widespread  Disposition Per Guideline:   See Today in Office  Reason For Disposition Reached:   Patient wants to be seen  Advice Given:  For Non-Itchy Rashes:  No treatment is necessary, except for heat rashes, which respond to cool baths.  Call Back If:   You become worse  Reassurance:  There are many causes of widespread rashes and most of the time they are not serious. Common causes include viral illness (e.g., cold viruses) and allergic reactions (to a food, medicine, or environmental exposure).  RN Overrode Recommendation:  Patient Already Has Appt,  Document Patient  Appt. already scheduled for 1615 10/30/13 with Dr. Damita Dunnings.

## 2013-10-30 NOTE — Assessment & Plan Note (Signed)
No clear trigger.  Nontoxic. Not at all c/w shingles or cellulitis.  Likely an external irritant.  Would use topical TAC 0.1% prn, routine cautions given.  Can use oral OTC benadryl prn.  F/u prn.  Routed to PCP as FYI.

## 2013-10-30 NOTE — Progress Notes (Signed)
Pre visit review using our clinic review tool, if applicable. No additional management support is needed unless otherwise documented below in the visit note.  R facial rash.  Now on L side of neck and B arms. None on the trunk.  Rash started today.  Not painful.  Slightly itchy on the face, not itchy on the arms or neck.  No fevers, chills, NAVD.  She feels well except for the incidental rash.  No palmar lesions.  No known poison ivy exposure.  "I'm not a yard person."  No cough, rhinorrhea, ST.  No new soaps, meds, detergents, etc.  No oral lesion.  Not SOB.    Meds, vitals, and allergies reviewed.   ROS: See HPI.  Otherwise, noncontributory.  nad ncat nondermatomal blanching rash on the B nasal creases, R>L, no conjunctival injection or eye discharge EOMI PERRL OP wnl Neck supple, no LA Scattered linear blanching red patches on the L side of the neck. Similar lesions on R>L forearm.  rrr ctab

## 2013-10-31 ENCOUNTER — Other Ambulatory Visit: Payer: Self-pay | Admitting: Internal Medicine

## 2013-10-31 MED ORDER — IBANDRONATE SODIUM 150 MG PO TABS: 150.0000 mg | ORAL_TABLET | ORAL | Status: DC

## 2013-10-31 MED ORDER — BENZONATATE 100 MG PO CAPS: ORAL_CAPSULE | ORAL | Status: DC

## 2013-10-31 NOTE — Telephone Encounter (Signed)
Message copied by Modena Nunnery on Thu Oct 31, 2013  3:19 PM ------      Message from: Viviana Simpler I      Created: Thu Oct 31, 2013  9:38 AM       Okay to refill ibandronate for a year and tessalon for 1 fill x 0      ----- Message -----         From: Tonia Ghent, MD         Sent: 10/30/2013   4:57 PM           To: Venia Carbon, MD            Seen at [place].  Needed refill on tessalon and ibandronate.  I deferred to you.  Thanks.             Brigitte Pulse             ------

## 2013-10-31 NOTE — Telephone Encounter (Signed)
Pt given refills to last until her cpx appt in 2/16.

## 2013-11-04 ENCOUNTER — Other Ambulatory Visit: Payer: Self-pay | Admitting: Family Medicine

## 2013-11-04 ENCOUNTER — Telehealth: Payer: Self-pay

## 2013-11-04 MED ORDER — TRIAMCINOLONE ACETONIDE 0.1 % EX CREA: 1.0000 "application " | TOPICAL_CREAM | Freq: Two times a day (BID) | CUTANEOUS | Status: DC | PRN

## 2013-11-04 NOTE — Telephone Encounter (Signed)
Patient advised.

## 2013-11-04 NOTE — Telephone Encounter (Signed)
Pt left v/m requesting larger tube of triamcinolone cream; pt said rash on face is better but other rash is spreading; pt has rash on both hands, chest and thighs.[facility]. If cannot refill cream does pt need to be rechecked due to rash spreading.pt request cb.

## 2013-11-04 NOTE — Telephone Encounter (Signed)
Resent, if not better soon then needs recheck. Thanks.

## 2013-11-13 ENCOUNTER — Other Ambulatory Visit: Payer: Self-pay | Admitting: Internal Medicine

## 2014-01-29 ENCOUNTER — Other Ambulatory Visit: Payer: Self-pay | Admitting: Internal Medicine

## 2014-02-06 ENCOUNTER — Ambulatory Visit (INDEPENDENT_AMBULATORY_CARE_PROVIDER_SITE_OTHER): Payer: Medicare Other | Admitting: Internal Medicine

## 2014-02-06 ENCOUNTER — Encounter: Payer: Self-pay | Admitting: Internal Medicine

## 2014-02-06 VITALS — BP 120/80 | HR 82 | Temp 97.5°F | Ht 64.5 in | Wt 175.0 lb

## 2014-02-06 DIAGNOSIS — M159 Polyosteoarthritis, unspecified: Secondary | ICD-10-CM

## 2014-02-06 DIAGNOSIS — M810 Age-related osteoporosis without current pathological fracture: Secondary | ICD-10-CM

## 2014-02-06 DIAGNOSIS — I10 Essential (primary) hypertension: Secondary | ICD-10-CM | POA: Diagnosis not present

## 2014-02-06 DIAGNOSIS — Z0000 Encounter for general adult medical examination without abnormal findings: Secondary | ICD-10-CM

## 2014-02-06 DIAGNOSIS — E038 Other specified hypothyroidism: Secondary | ICD-10-CM | POA: Diagnosis not present

## 2014-02-06 DIAGNOSIS — R7301 Impaired fasting glucose: Secondary | ICD-10-CM | POA: Diagnosis not present

## 2014-02-06 DIAGNOSIS — E785 Hyperlipidemia, unspecified: Secondary | ICD-10-CM

## 2014-02-06 DIAGNOSIS — Z7189 Other specified counseling: Secondary | ICD-10-CM | POA: Insufficient documentation

## 2014-02-06 DIAGNOSIS — M150 Primary generalized (osteo)arthritis: Secondary | ICD-10-CM | POA: Diagnosis not present

## 2014-02-06 MED ORDER — IBANDRONATE SODIUM 150 MG PO TABS: 150.0000 mg | ORAL_TABLET | ORAL | Status: DC

## 2014-02-06 MED ORDER — VALSARTAN 320 MG PO TABS: 320.0000 mg | ORAL_TABLET | Freq: Every day | ORAL | Status: DC

## 2014-02-06 MED ORDER — OMEPRAZOLE 40 MG PO CPDR: 40.0000 mg | DELAYED_RELEASE_CAPSULE | Freq: Every day | ORAL | Status: DC

## 2014-02-06 MED ORDER — BENZONATATE 100 MG PO CAPS: ORAL_CAPSULE | ORAL | Status: DC

## 2014-02-06 MED ORDER — CETIRIZINE HCL 10 MG PO TABS: 10.0000 mg | ORAL_TABLET | Freq: Every day | ORAL | Status: DC

## 2014-02-06 MED ORDER — ATORVASTATIN CALCIUM 80 MG PO TABS: 80.0000 mg | ORAL_TABLET | Freq: Every day | ORAL | Status: DC

## 2014-02-06 NOTE — Assessment & Plan Note (Signed)
I have personally reviewed the Medicare Annual Wellness questionnaire and have noted 1. The patient's medical and social history 2. Their use of alcohol, tobacco or illicit drugs 3. Their current medications and supplements 4. The patient's functional ability including ADL's, fall risks, home safety risks and hearing or visual             impairment. 5. Diet and physical activities 6. Evidence for depression or mood disorders  The patients weight, height, BMI and visual acuity have been recorded in the chart I have made referrals, counseling and provided education to the patient based review of the above and I have provided the pt with a written personalized care plan for preventive services.  I have provided you with a copy of your personalized plan for preventive services. Please take the time to review along with your updated medication list.  Will need prevnar--can't give today due to recent flu vaccine Colonoscopy due 2022 if appropriate Due for mammogram--preferred no physical exam UTD otherwise

## 2014-02-06 NOTE — Assessment & Plan Note (Signed)
BP Readings from Last 3 Encounters:  02/06/14 120/80  10/30/13 120/80  07/27/12 110/80   Good control

## 2014-02-06 NOTE — Addendum Note (Signed)
Addended by: Despina Hidden on: 02/06/2014 04:53 PM   Modules accepted: Orders, Medications

## 2014-02-06 NOTE — Assessment & Plan Note (Signed)
Euthyroid  Due for labs 

## 2014-02-06 NOTE — Assessment & Plan Note (Signed)
Will continue the ibandronate till next year

## 2014-02-06 NOTE — Assessment & Plan Note (Signed)
Primary vs secondary prevention Okay with statin

## 2014-02-06 NOTE — Progress Notes (Signed)
Subjective:    Patient ID: Sandra Montgomery, female    DOB: 23-Jun-1940, 73 y.o.   MRN: [id number]  HPI Here for Medicare wellness and follow up Reviewed form and advanced directives No tobacco or alcohol Not much exercise--does do some line dancing and has to do all instrumental ADLs for her and husband Reviewed other doctors---just sees eye doctor and dentist. Will occasionally use walk in clinic in [place] and vision are okay. Early cataracts. No falls No depression or anhedonia  Ongoing stress with husband Had UTI, got sick in hospital--now in rehab Got permission to bring him to her work --- will be able to rest in back room--then go to adult day care  Continues on the metformin More comfortable staying on it Tries to ear reasonably Weight is stable  No chest pain No SOB No palpitations  No dizziness or syncope No edema  Still with hand and knee arthritis Uses the aleve with some success  Has allergies that cause cough Wants the benzonatate to help the annoying cough at work  No problems with statin No myalgias or GI upset  Current Outpatient Prescriptions on File Prior to Visit  Medication Sig Dispense Refill  . atorvastatin (LIPITOR) 80 MG tablet TAKE 1 TABLET DAILY 90 tablet 0  . cholecalciferol (VITAMIN D) 1000 UNITS tablet Take 1,000 Units by mouth daily.    Marland Kitchen DIOVAN 320 MG tablet TAKE 1 TABLET DAILY 90 tablet 3  . hydrochlorothiazide (HYDRODIURIL) 25 MG tablet TAKE 1 TABLET DAILY 90 tablet 2  . levocetirizine (XYZAL) 5 MG tablet TAKE 1 TABLET EVERY EVENING 90 tablet 0  . levothyroxine (SYNTHROID, LEVOTHROID) 75 MCG tablet TAKE 1 TABLET DAILY 90 tablet 0  . metFORMIN (GLUCOPHAGE) 500 MG tablet TAKE 1 TABLET TWICE A DAY WITH MEALS 180 tablet 0  . naproxen sodium (ANAPROX) 220 MG tablet Take 220 mg by mouth 2 (two) times daily with a meal.    . omeprazole (PRILOSEC) 40 MG capsule TAKE 1 CAPSULE DAILY 90 capsule 3  . triamcinolone cream (KENALOG)  0.1 % Apply 1 application topically 2 (two) times daily as needed. 30 g 1   No current facility-administered medications on file prior to visit.    Allergies  Allergen Reactions  . Tape Rash    And peels the skin    Past Medical History  Diagnosis Date  . Hypertension   . Hyperlipidemia   . Allergic rhinitis, cause unspecified   . Unspecified hypothyroidism   . Impaired fasting glucose   . Osteoarthrosis involving, or with mention of more than one site, but not specified as generalized, multiple sites   . GERD (gastroesophageal reflux disease)   . Pneumothorax 1965    prolonged bed rest   . Osteoporosis     Past Surgical History  Procedure Laterality Date  . Tonsillectomy and adenoidectomy  1961  . Abdominal hysterectomy  1981  . Breast surgery  1985    non cancerous mass removed    Family History  Problem Relation Age of Onset  . Alzheimer's disease Mother   . Hyperlipidemia Father   . Heart disease Father     History   Social History  . Marital Status: Married    Spouse Name: N/A    Number of Children: 2  . Years of Education: N/A   Occupational History  . [employer]   Social History Main Topics  . Smoking status: Never Smoker   .  Smokeless tobacco: Never Used  . Alcohol Use: No  . Drug Use: No  . Sexual Activity: Not on file   Other Topics Concern  . Not on file   Social History Narrative   Still works daily   No living will    Requests daughter Malachy Mood to be health care POA (along with husband)   Would accept attempts at CPR but no prolonged ventilation.    Would accept feeding tube temporarily   Review of Systems Sleeps okay but awakened hourly by husband to go to bathroom. Does well while he is at rehab Bowels are fine No skin issues--rash did go away Voids okay---does have urgency and needs pad for incontinence (though rare)     Objective:   Physical Exam  Constitutional: She is oriented to person,  place, and time. She appears well-developed and well-nourished. No distress.  HENT:  Mouth/Throat: Oropharynx is clear and moist. No oropharyngeal exudate.  Neck: Normal range of motion. No thyromegaly present.  Cardiovascular: Normal rate, regular rhythm, normal heart sounds and intact distal pulses.  Exam reveals no gallop.   No murmur heard. Pulmonary/Chest: Effort normal and breath sounds normal. No respiratory distress. She has no wheezes. She has no rales.  Abdominal: Soft. There is no tenderness.  Musculoskeletal: She exhibits no edema or tenderness.  Lymphadenopathy:    She has no cervical adenopathy.  Neurological: She is alert and oriented to person, place, and time.  President-- "Obama, [employer] ?" [id number] D-l-r-o-w Recall 3/3  Skin: No rash noted. No erythema.  Psychiatric: She has a normal mood and affect. Her behavior is normal.          Assessment & Plan:

## 2014-02-06 NOTE — Assessment & Plan Note (Signed)
Doing well on metformin Will continue this

## 2014-02-06 NOTE — Assessment & Plan Note (Signed)
See social history 

## 2014-02-06 NOTE — Patient Instructions (Signed)
Please set up your screening mammogram.  DASH Eating Plan DASH stands for "Dietary Approaches to Stop Hypertension." The DASH eating plan is a healthy eating plan that has been shown to reduce high blood pressure (hypertension). Additional health benefits may include reducing the risk of type 2 diabetes mellitus, heart disease, and stroke. The DASH eating plan may also help with weight loss. WHAT DO I NEED TO KNOW ABOUT THE DASH EATING PLAN? For the DASH eating plan, you will follow these general guidelines:  Choose foods with a percent daily value for sodium of less than 5% (as listed on the food label).  Use salt-free seasonings or herbs instead of table salt or sea salt.  Check with your health care provider or pharmacist before using salt substitutes.  Eat lower-sodium products, often labeled as "lower sodium" or "no salt added."  Eat fresh foods.  Eat more vegetables, fruits, and low-fat dairy products.  Choose whole grains. Look for the word "whole" as the first word in the ingredient list.  Choose fish and skinless chicken or turkey more often than red meat. Limit fish, poultry, and meat to 6 oz (170 g) each day.  Limit sweets, desserts, sugars, and sugary drinks.  Choose heart-healthy fats.  Limit cheese to 1 oz (28 g) per day.  Eat more home-cooked food and less restaurant, buffet, and fast food.  Limit fried foods.  Cook foods using methods other than frying.  Limit canned vegetables. If you do use them, rinse them well to decrease the sodium.  When eating at a restaurant, ask that your food be prepared with less salt, or no salt if possible. WHAT FOODS CAN I EAT? Seek help from a dietitian for individual calorie needs. Grains Whole grain or whole wheat bread. Brown rice. Whole grain or whole wheat pasta. Quinoa, bulgur, and whole grain cereals. Low-sodium cereals. Corn or whole wheat flour tortillas. Whole grain cornbread. Whole grain crackers. Low-sodium  crackers. Vegetables Fresh or frozen vegetables (raw, steamed, roasted, or grilled). Low-sodium or reduced-sodium tomato and vegetable juices. Low-sodium or reduced-sodium tomato sauce and paste. Low-sodium or reduced-sodium canned vegetables.  Fruits All fresh, canned (in natural juice), or frozen fruits. Meat and Other Protein Products Ground beef (85% or leaner), grass-fed beef, or beef trimmed of fat. Skinless chicken or turkey. Ground chicken or turkey. Pork trimmed of fat. All fish and seafood. Eggs. Dried beans, peas, or lentils. Unsalted nuts and seeds. Unsalted canned beans. Dairy Low-fat dairy products, such as skim or 1% milk, 2% or reduced-fat cheeses, low-fat ricotta or cottage cheese, or plain low-fat yogurt. Low-sodium or reduced-sodium cheeses. Fats and Oils Tub margarines without trans fats. Light or reduced-fat mayonnaise and salad dressings (reduced sodium). Avocado. Safflower, olive, or canola oils. Natural peanut or almond butter. Other Unsalted popcorn and pretzels. The items listed above may not be a complete list of recommended foods or beverages. Contact your dietitian for more options. WHAT FOODS ARE NOT RECOMMENDED? Grains White bread. White pasta. White rice. Refined cornbread. Bagels and croissants. Crackers that contain trans fat. Vegetables Creamed or fried vegetables. Vegetables in a cheese sauce. Regular canned vegetables. Regular canned tomato sauce and paste. Regular tomato and vegetable juices. Fruits Dried fruits. Canned fruit in light or heavy syrup. Fruit juice. Meat and Other Protein Products Fatty cuts of meat. Ribs, chicken wings, bacon, sausage, bologna, salami, chitterlings, fatback, hot dogs, bratwurst, and packaged luncheon meats. Salted nuts and seeds. Canned beans with salt. Dairy Whole or 2% milk, cream, half-and-half, and   cream cheese. Whole-fat or sweetened yogurt. Full-fat cheeses or blue cheese. Nondairy creamers and whipped toppings.  Processed cheese, cheese spreads, or cheese curds. Condiments Onion and garlic salt, seasoned salt, table salt, and sea salt. Canned and packaged gravies. Worcestershire sauce. Tartar sauce. Barbecue sauce. Teriyaki sauce. Soy sauce, including reduced sodium. Steak sauce. Fish sauce. Oyster sauce. Cocktail sauce. Horseradish. Ketchup and mustard. Meat flavorings and tenderizers. Bouillon cubes. Hot sauce. Tabasco sauce. Marinades. Taco seasonings. Relishes. Fats and Oils Butter, stick margarine, lard, shortening, ghee, and bacon fat. Coconut, palm kernel, or palm oils. Regular salad dressings. Other Pickles and olives. Salted popcorn and pretzels. The items listed above may not be a complete list of foods and beverages to avoid. Contact your dietitian for more information. WHERE CAN I FIND MORE INFORMATION? National Heart, Lung, and Blood Institute: www.nhlbi.nih.gov/health/health-topics/topics/dash/ Document Released: 03/10/2011 Document Revised: 08/05/2013 Document Reviewed: 01/23/2013 ExitCare Patient Information 2015 [employer]. This information is not intended to replace advice given to you by your health care provider. Make sure you discuss any questions you have with your health care provider.  

## 2014-02-06 NOTE — Assessment & Plan Note (Signed)
Doing okay on the aleve

## 2014-02-07 ENCOUNTER — Telehealth: Payer: Self-pay | Admitting: Internal Medicine

## 2014-02-07 LAB — COMPREHENSIVE METABOLIC PANEL
ALBUMIN: 3.8 g/dL (ref 3.5–5.2)
ALT: 20 U/L (ref 0–35)
AST: 16 U/L (ref 0–37)
Alkaline Phosphatase: 78 U/L (ref 39–117)
BUN: 22 mg/dL (ref 6–23)
CO2: 25 meq/L (ref 19–32)
Calcium: 10 mg/dL (ref 8.4–10.5)
Chloride: 101 mEq/L (ref 96–112)
Creatinine, Ser: 1.1 mg/dL (ref 0.4–1.2)
GFR: 54.18 mL/min — AB (ref >60.00)
Glucose, Bld: 89 mg/dL (ref 70–99)
POTASSIUM: 4.8 meq/L (ref 3.5–5.1)
Sodium: 138 mEq/L (ref 135–145)
TOTAL PROTEIN: 7.4 g/dL (ref 6.0–8.3)
Total Bilirubin: 0.6 mg/dL (ref 0.2–1.2)

## 2014-02-07 LAB — HEMOGLOBIN A1C: Hgb A1c MFr Bld: 6.4 % (ref 4.6–6.5)

## 2014-02-07 LAB — LIPID PANEL
CHOLESTEROL: 288 mg/dL — AB (ref 0–200)
HDL: 45.5 mg/dL (ref >39.00)
NonHDL: 242.5
Total CHOL/HDL Ratio: 6
Triglycerides: 206 mg/dL — ABNORMAL HIGH (ref 0.0–149.0)
VLDL: 41.2 mg/dL — AB (ref 0.0–40.0)

## 2014-02-07 LAB — CBC WITH DIFFERENTIAL/PLATELET
BASOS ABS: 0.1 10*3/uL (ref 0.0–0.1)
Basophils Relative: 0.7 % (ref 0.0–3.0)
EOS PCT: 2.6 % (ref 0.0–5.0)
Eosinophils Absolute: 0.2 10*3/uL (ref 0.0–0.7)
HCT: 42.4 % (ref 36.0–46.0)
Hemoglobin: 13.9 g/dL (ref 12.0–15.0)
Lymphocytes Relative: 28.1 % (ref 12.0–46.0)
Lymphs Abs: 2.3 10*3/uL (ref 0.7–4.0)
MCHC: 32.9 g/dL (ref 30.0–36.0)
MCV: 87.6 fl (ref 78.0–100.0)
MONOS PCT: 6.9 % (ref 3.0–12.0)
Monocytes Absolute: 0.6 10*3/uL (ref 0.1–1.0)
Neutro Abs: 5.1 10*3/uL (ref 1.4–7.7)
Neutrophils Relative %: 61.7 % (ref 43.0–77.0)
PLATELETS: 244 10*3/uL (ref 150.0–400.0)
RBC: 4.84 Mil/uL (ref 3.87–5.11)
RDW: 13.5 % (ref 11.5–15.5)
WBC: 8.2 10*3/uL (ref 4.0–10.5)

## 2014-02-07 LAB — TSH: TSH: 0.62 u[IU]/mL (ref 0.35–4.50)

## 2014-02-07 LAB — LDL CHOLESTEROL, DIRECT: Direct LDL: 190 mg/dL

## 2014-02-07 LAB — T4, FREE: FREE T4: 1.16 ng/dL (ref 0.60–1.60)

## 2014-02-07 NOTE — Telephone Encounter (Signed)
emmi emailed °

## 2014-02-12 ENCOUNTER — Telehealth: Payer: Self-pay | Admitting: *Deleted

## 2014-02-12 NOTE — Telephone Encounter (Signed)
Called pt to advised that her [employer] will not cover the Boniva to be taken once monthly, they will cover fosamax to be take once weekly. Just need to know if she is ok with the switch, if not pt will have to pay out of pocket for Boniva.  Left message on machine to have pt return my call.

## 2014-02-17 NOTE — Telephone Encounter (Signed)
What dose of fosamax should pt get?

## 2014-02-17 NOTE — Telephone Encounter (Signed)
Pt called, read phone note from DS, pt states she is fine with the switch to Fosamax.  [facility]  Best number to call is [phone] (work number [phone]) / lt

## 2014-02-17 NOTE — Telephone Encounter (Signed)
70mg  weekly

## 2014-02-18 MED ORDER — ALENDRONATE SODIUM 70 MG PO TABS: 70.0000 mg | ORAL_TABLET | ORAL | Status: DC

## 2014-02-18 NOTE — Telephone Encounter (Signed)
rx sent to pharmacy by e-script  

## 2014-04-10 DIAGNOSIS — H2513 Age-related nuclear cataract, bilateral: Secondary | ICD-10-CM | POA: Diagnosis not present

## 2014-04-11 ENCOUNTER — Other Ambulatory Visit: Payer: Self-pay | Admitting: Internal Medicine

## 2014-05-09 ENCOUNTER — Encounter: Payer: Medicare Other | Admitting: Internal Medicine

## 2014-06-20 ENCOUNTER — Other Ambulatory Visit: Payer: Self-pay | Admitting: Internal Medicine

## 2014-07-16 DIAGNOSIS — M65341 Trigger finger, right ring finger: Secondary | ICD-10-CM | POA: Diagnosis not present

## 2014-10-02 ENCOUNTER — Telehealth: Payer: Self-pay | Admitting: *Deleted

## 2014-10-02 NOTE — Telephone Encounter (Signed)
Lm on pts vm regarding scheduling mammogram

## 2014-10-28 ENCOUNTER — Other Ambulatory Visit: Payer: Self-pay | Admitting: Internal Medicine

## 2014-10-28 DIAGNOSIS — Z1231 Encounter for screening mammogram for malignant neoplasm of breast: Secondary | ICD-10-CM

## 2014-11-11 ENCOUNTER — Other Ambulatory Visit: Payer: Self-pay | Admitting: Internal Medicine

## 2014-11-11 ENCOUNTER — Ambulatory Visit
Admission: RE | Admit: 2014-11-11 | Discharge: 2014-11-11 | Disposition: A | Discharge status: Home / Self Care | Payer: Medicare Other | Source: Ambulatory Visit | Attending: Internal Medicine | Admitting: Internal Medicine

## 2014-11-11 DIAGNOSIS — Z1231 Encounter for screening mammogram for malignant neoplasm of breast: Secondary | ICD-10-CM

## 2014-12-23 DIAGNOSIS — H43811 Vitreous degeneration, right eye: Secondary | ICD-10-CM | POA: Diagnosis not present

## 2014-12-25 ENCOUNTER — Other Ambulatory Visit: Payer: Self-pay | Admitting: Internal Medicine

## 2015-02-12 ENCOUNTER — Encounter: Payer: Medicare Other | Admitting: Internal Medicine

## 2015-02-27 ENCOUNTER — Other Ambulatory Visit: Payer: Self-pay | Admitting: Internal Medicine

## 2015-03-05 ENCOUNTER — Ambulatory Visit (INDEPENDENT_AMBULATORY_CARE_PROVIDER_SITE_OTHER): Payer: Medicare Other | Admitting: Internal Medicine

## 2015-03-05 ENCOUNTER — Encounter: Payer: Self-pay | Admitting: Internal Medicine

## 2015-03-05 VITALS — BP 130/80 | HR 71 | Temp 97.9°F | Ht 65.0 in | Wt 177.0 lb

## 2015-03-05 DIAGNOSIS — I10 Essential (primary) hypertension: Secondary | ICD-10-CM | POA: Diagnosis not present

## 2015-03-05 DIAGNOSIS — Z0000 Encounter for general adult medical examination without abnormal findings: Secondary | ICD-10-CM | POA: Diagnosis not present

## 2015-03-05 DIAGNOSIS — Z23 Encounter for immunization: Secondary | ICD-10-CM

## 2015-03-05 DIAGNOSIS — M810 Age-related osteoporosis without current pathological fracture: Secondary | ICD-10-CM

## 2015-03-05 DIAGNOSIS — E039 Hypothyroidism, unspecified: Secondary | ICD-10-CM | POA: Diagnosis not present

## 2015-03-05 DIAGNOSIS — R7301 Impaired fasting glucose: Secondary | ICD-10-CM | POA: Diagnosis not present

## 2015-03-05 DIAGNOSIS — Z7189 Other specified counseling: Secondary | ICD-10-CM

## 2015-03-05 DIAGNOSIS — E785 Hyperlipidemia, unspecified: Secondary | ICD-10-CM

## 2015-03-05 NOTE — Progress Notes (Signed)
Pre visit review using our clinic review tool, if applicable. No additional management support is needed unless otherwise documented below in the visit note. 

## 2015-03-05 NOTE — Assessment & Plan Note (Signed)
On fosamax over 5 years---will stop Needs vitamin D

## 2015-03-05 NOTE — Assessment & Plan Note (Signed)
BP Readings from Last 3 Encounters:  03/05/15 130/80  02/06/14 120/80  10/30/13 120/80   Good control

## 2015-03-05 NOTE — Assessment & Plan Note (Signed)
Will continue meds

## 2015-03-05 NOTE — Assessment & Plan Note (Signed)
I have personally reviewed the Medicare Annual Wellness questionnaire and have noted 1. The patient's medical and social history 2. Their use of alcohol, tobacco or illicit drugs 3. Their current medications and supplements 4. The patient's functional ability including ADL's, fall risks, home safety risks and hearing or visual             impairment. 5. Diet and physical activities 6. Evidence for depression or mood disorders  The patients weight, height, BMI and visual acuity have been recorded in the chart I have made referrals, counseling and provided education to the patient based review of the above and I have provided the pt with a written personalized care plan for preventive services.  I have provided you with a copy of your personalized plan for preventive services. Please take the time to review along with your updated medication list.  Flu and prevnar vaccine mammo due 2018 Colon probably done

## 2015-03-05 NOTE — Assessment & Plan Note (Signed)
Fine on metformin Discussed---will continue this

## 2015-03-05 NOTE — Addendum Note (Signed)
Addended by: Despina Hidden on: 03/05/2015 04:19 PM   Modules accepted: Orders

## 2015-03-05 NOTE — Assessment & Plan Note (Signed)
Urged her to do formal advanced directives since doesn't want husband doing this-- forms given

## 2015-03-05 NOTE — Assessment & Plan Note (Signed)
Seems euthyroid ?Will check labs ?

## 2015-03-05 NOTE — Progress Notes (Signed)
Subjective:    Patient ID: Sandra Montgomery, female    DOB: November 30, 1940, 74 y.o.   MRN: [id number]  HPI Here for Medicare wellness and follow up of chronic medical conditions Reviewed form and advanced directives Reviewed other doctors No tobacco Rare alcohol Did fall once with minor injury this year Mild vision problems--keeps up with eye doctor. 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 alendronate (FOSAMAX) 70 MG tablet Take 1 tablet (70 mg total) by mouth once a week. Take with a full glass of water on an empty stomach. 12 tablet 3  . atorvastatin (LIPITOR) 80 MG tablet TAKE 1 TABLET DAILY AT 6 P.M. 90 tablet 2  . benzonatate (TESSALON) 100 MG capsule TAKE 1 CAPSULE THREE TIMES A DAY AS NEEDED FOR COUGH 270 capsule 0  . cetirizine (ZYRTEC) 10 MG tablet Take 1 tablet (10 mg total) by mouth daily. 90 tablet 3  . cholecalciferol (VITAMIN D) 1000 UNITS tablet Take 1,000 Units by mouth daily.    . hydrochlorothiazide (HYDRODIURIL) 25 MG tablet TAKE 1 TABLET DAILY 90 tablet 3  . levothyroxine (SYNTHROID, LEVOTHROID) 75 MCG tablet TAKE 1 TABLET DAILY 90 tablet 3    . metFORMIN (GLUCOPHAGE) 500 MG tablet TAKE 1 TABLET TWICE A DAY WITH MEALS 180 tablet 3  . naproxen sodium (ANAPROX) 220 MG tablet Take 220 mg by mouth 2 (two) times daily with a meal.    . omeprazole (PRILOSEC) 40 MG capsule TAKE 1 CAPSULE DAILY 90 capsule 2  . triamcinolone cream (KENALOG) 0.1 % Apply 1 application topically 2 (two) times daily as needed. 30 g 1  . valsartan (DIOVAN) 320 MG tablet TAKE 1 TABLET DAILY 90 tablet 2   No current facility-administered medications on file prior to visit.    Allergies  Allergen Reactions  . Tape Rash    And peels the skin    Past Medical History  Diagnosis Date  . Hypertension   . Hyperlipidemia   . Allergic rhinitis, cause unspecified   . Unspecified hypothyroidism   . Impaired fasting glucose   . Osteoarthrosis involving, or with mention of more than one site, but not specified as generalized, multiple sites   . GERD (gastroesophageal reflux disease)   . Pneumothorax 1965    prolonged bed rest   . Osteoporosis     Past Surgical History  Procedure Laterality Date  . Tonsillectomy and adenoidectomy  1961  . Abdominal hysterectomy  1981  . Breast surgery  1985    non cancerous mass removed  . Breast excisional biopsy Left years ago    x 2. Negative.     Family History  Problem Relation Age of Onset  .  Alzheimer's disease Mother   . Hyperlipidemia Father   . Heart disease Father     Social History   Social History  . Marital Status: Married    Spouse Name: N/A  . Number of Children: 2  . Years of Education: N/A   Occupational History  . [employer]   Social History Main Topics  . Smoking status: Never Smoker   . Smokeless tobacco: Never Used  . Alcohol Use: No  . Drug Use: No  . Sexual Activity: Not on file   Other Topics Concern  . Not on file   Social History Narrative   Still works daily   No living will    Requests daughter Malachy Mood to be health care POA    Would  accept attempts at CPR but no prolonged ventilation.    Would accept feeding tube temporarily   Review of Systems Doesn't sleep well because husband gets her up so often Appetite is okay Weight fairly stable Teeth are fine--regular with dentist Wears seat belt Bowels are fine No skin problems--- multiple stable seborrheic keratoses No problems with heartburn on the med--- no dysphagia    Objective:   Physical Exam  Constitutional: She is oriented to person, place, and time. She appears well-developed and well-nourished. No distress.  HENT:  Mouth/Throat: Oropharynx is clear and moist. No oropharyngeal exudate.  Neck: Normal range of motion. Neck supple. No thyromegaly present.  Cardiovascular: Normal rate, regular rhythm and intact distal pulses.  Exam reveals no gallop.   No murmur heard. Pulmonary/Chest: Effort normal and breath sounds normal. No respiratory distress. She has no wheezes. She has no rales.  Abdominal: Soft. There is no tenderness.  Musculoskeletal: She exhibits no edema or tenderness.  Lymphadenopathy:    She has no cervical adenopathy.  Neurological: She is alert and oriented to person, place, and time.  President-- "Obama, [employer]-- (then) Bush" [id number] D-l-r-o-w Recall 3/3  Skin: No rash noted. No erythema.  Psychiatric: She has a normal mood and affect. Her behavior is normal.          Assessment & Plan:

## 2015-03-06 LAB — CBC WITH DIFFERENTIAL/PLATELET
Basophils Absolute: 0 10*3/uL (ref 0.0–0.1)
Basophils Relative: 0.3 % (ref 0.0–3.0)
EOS PCT: 2.8 % (ref 0.0–5.0)
Eosinophils Absolute: 0.2 10*3/uL (ref 0.0–0.7)
HCT: 40.8 % (ref 36.0–46.0)
Hemoglobin: 13.5 g/dL (ref 12.0–15.0)
LYMPHS ABS: 1.9 10*3/uL (ref 0.7–4.0)
Lymphocytes Relative: 21.6 % (ref 12.0–46.0)
MCHC: 33 g/dL (ref 30.0–36.0)
MCV: 87.9 fl (ref 78.0–100.0)
MONOS PCT: 7.9 % (ref 3.0–12.0)
Monocytes Absolute: 0.7 10*3/uL (ref 0.1–1.0)
NEUTROS ABS: 5.9 10*3/uL (ref 1.4–7.7)
NEUTROS PCT: 67.4 % (ref 43.0–77.0)
PLATELETS: 226 10*3/uL (ref 150.0–400.0)
RBC: 4.65 Mil/uL (ref 3.87–5.11)
RDW: 13.8 % (ref 11.5–15.5)
WBC: 8.8 10*3/uL (ref 4.0–10.5)

## 2015-03-06 LAB — COMPREHENSIVE METABOLIC PANEL
ALT: 16 U/L (ref 0–35)
AST: 16 U/L (ref 0–37)
Albumin: 4.3 g/dL (ref 3.5–5.2)
Alkaline Phosphatase: 74 U/L (ref 39–117)
BILIRUBIN TOTAL: 0.6 mg/dL (ref 0.2–1.2)
BUN: 18 mg/dL (ref 6–23)
CO2: 29 meq/L (ref 19–32)
Calcium: 9.9 mg/dL (ref 8.4–10.5)
Chloride: 101 mEq/L (ref 96–112)
Creatinine, Ser: 0.99 mg/dL (ref 0.40–1.20)
GFR: 58.45 mL/min — AB (ref >60.00)
GLUCOSE: 78 mg/dL (ref 70–99)
POTASSIUM: 4.3 meq/L (ref 3.5–5.1)
Sodium: 139 mEq/L (ref 135–145)
Total Protein: 7.1 g/dL (ref 6.0–8.3)

## 2015-03-06 LAB — LIPID PANEL
CHOL/HDL RATIO: 3
Cholesterol: 159 mg/dL (ref 0–200)
HDL: 49.4 mg/dL (ref >39.00)
LDL Cholesterol: 89 mg/dL (ref 0–99)
NONHDL: 109.65
Triglycerides: 101 mg/dL (ref 0.0–149.0)
VLDL: 20.2 mg/dL (ref 0.0–40.0)

## 2015-03-06 LAB — TSH: TSH: 1.43 u[IU]/mL (ref 0.35–4.50)

## 2015-03-06 LAB — HEMOGLOBIN A1C: Hgb A1c MFr Bld: 6.3 % (ref 4.6–6.5)

## 2015-03-06 LAB — T4, FREE: Free T4: 1.12 ng/dL (ref 0.60–1.60)

## 2015-03-17 ENCOUNTER — Other Ambulatory Visit: Payer: Self-pay | Admitting: Internal Medicine

## 2015-05-25 ENCOUNTER — Ambulatory Visit (INDEPENDENT_AMBULATORY_CARE_PROVIDER_SITE_OTHER): Payer: Medicare Other | Admitting: Internal Medicine

## 2015-05-25 ENCOUNTER — Encounter: Payer: Self-pay | Admitting: Internal Medicine

## 2015-05-25 VITALS — BP 140/72 | HR 75 | Temp 98.1°F | Resp 14 | Wt 172.8 lb

## 2015-05-25 DIAGNOSIS — J0110 Acute frontal sinusitis, unspecified: Secondary | ICD-10-CM

## 2015-05-25 MED ORDER — HYDROCODONE-HOMATROPINE 5-1.5 MG/5ML PO SYRP: 5.0000 mL | ORAL_SOLUTION | Freq: Every evening | ORAL | Status: DC | PRN

## 2015-05-25 MED ORDER — DOXYCYCLINE HYCLATE 100 MG PO TABS: 100.0000 mg | ORAL_TABLET | Freq: Two times a day (BID) | ORAL | Status: DC

## 2015-05-25 NOTE — Progress Notes (Signed)
Patient received education resource, including the self-management goal and tool. Patient verbalized understanding. 

## 2015-05-25 NOTE — Assessment & Plan Note (Signed)
Cough is clearly croupy---- but now has secondary sinus symptoms Still may be viral Will give cough med Start doxy if not improving in the next couple of days

## 2015-05-25 NOTE — Progress Notes (Signed)
Subjective:    Patient ID: Sandra Montgomery, female    DOB: 04-16-1940, 75 y.o.   MRN: [id number]  HPI Here due to cough   She has been sick about a week Coarse, deep cough Dry mostly No fever No chills or sweats Throat sore from the coarse cough Not SOB  No ear pain--but her head hurts with the cough (like on the surface) Feels like she has sinus (frontal) pain  Has tried mucinex-- may have helped but not sure  Current Outpatient Prescriptions on File Prior to Visit  Medication Sig Dispense Refill  . atorvastatin (LIPITOR) 80 MG tablet TAKE 1 TABLET DAILY AT 6 P.M. 90 tablet 2  . cetirizine (ZYRTEC) 10 MG tablet Take 1 tablet (10 mg total) by mouth daily. 90 tablet 3  . hydrochlorothiazide (HYDRODIURIL) 25 MG tablet TAKE 1 TABLET DAILY 90 tablet 3  . levothyroxine (SYNTHROID, LEVOTHROID) 75 MCG tablet TAKE 1 TABLET DAILY 90 tablet 3  . metFORMIN (GLUCOPHAGE) 500 MG tablet TAKE 1 TABLET TWICE A DAY WITH MEALS 180 tablet 2  . naproxen sodium (ANAPROX) 220 MG tablet Take 220 mg by mouth 2 (two) times daily with a meal.    . omeprazole (PRILOSEC) 40 MG capsule TAKE 1 CAPSULE DAILY 90 capsule 2  . triamcinolone cream (KENALOG) 0.1 % Apply 1 application topically 2 (two) times daily as needed. 30 g 1  . valsartan (DIOVAN) 320 MG tablet TAKE 1 TABLET DAILY 90 tablet 2   No current facility-administered medications on file prior to visit.    Allergies  Allergen Reactions  . Tape Rash    And peels the skin    Past Medical History  Diagnosis Date  . Hypertension   . Hyperlipidemia   . Allergic rhinitis, cause unspecified   . Unspecified hypothyroidism   . Impaired fasting glucose   . Osteoarthrosis involving, or with mention of more than one site, but not specified as generalized, multiple sites   . GERD (gastroesophageal reflux disease)   . Pneumothorax 1965    prolonged bed rest   . Osteoporosis     Past Surgical History  Procedure Laterality Date  .  Tonsillectomy and adenoidectomy  1961  . Abdominal hysterectomy  1981  . Breast surgery  1985    non cancerous mass removed  . Breast excisional biopsy Left years ago    x 2. Negative.     Family History  Problem Relation Age of Onset  . Alzheimer's disease Mother   . Hyperlipidemia Father   . Heart disease Father     Social History   Social History  . Marital Status: Married    Spouse Name: N/A  . Number of Children: 2  . Years of Education: N/A   Occupational History  . [employer]   Social History Main Topics  . Smoking status: Never Smoker   . Smokeless tobacco: Never Used  . Alcohol Use: No  . Drug Use: No  . Sexual Activity: Not on file   Other Topics Concern  . Not on file   Social History Narrative   Still works daily   No living will    Requests daughter Malachy Mood to be health care POA    Would accept attempts at CPR but no prolonged ventilation.    Would accept feeding tube temporarily   Review of Systems No rash Missed work today No vomiting or diarrhea Appetite is okay    Objective:  Physical Exam  Constitutional: She appears well-developed and well-nourished. No distress.  Coarse croupy cough  HENT:  Mouth/Throat: Oropharynx is clear and moist. No oropharyngeal exudate.  Mild bilateral frontal tenderness Moderate nasal inflammation TMs normal  Neck: Normal range of motion. Neck supple.  Pulmonary/Chest: Breath sounds normal. No respiratory distress. She has no wheezes. She has no rales.  Lymphadenopathy:    She has no cervical adenopathy.          Assessment & Plan:

## 2015-05-25 NOTE — Patient Instructions (Signed)
Start the doxycycline if you worsen--or are not getting better by the end of the week.

## 2015-06-09 ENCOUNTER — Other Ambulatory Visit: Payer: Self-pay | Admitting: Internal Medicine

## 2015-06-10 NOTE — Telephone Encounter (Signed)
Left message on voice mail at home to call back. The chart shows she was not taking this medication as of 05-25-15. Does she really need a refill?

## 2015-06-11 NOTE — Telephone Encounter (Signed)
Left message at home number, again. Unable to leave a message on cell voice mail as DPR states to do.

## 2015-06-12 NOTE — Telephone Encounter (Signed)
I sent a refusal to the pharmacy. Her med list stated she was no longer on the medication.

## 2015-06-15 ENCOUNTER — Other Ambulatory Visit: Payer: Self-pay | Admitting: Internal Medicine

## 2015-09-21 ENCOUNTER — Other Ambulatory Visit: Payer: Self-pay | Admitting: Internal Medicine

## 2015-11-04 ENCOUNTER — Encounter: Payer: Self-pay | Admitting: Internal Medicine

## 2015-12-12 ENCOUNTER — Other Ambulatory Visit: Payer: Self-pay | Admitting: Internal Medicine

## 2016-03-07 ENCOUNTER — Encounter: Payer: Self-pay | Admitting: Internal Medicine

## 2016-03-07 ENCOUNTER — Ambulatory Visit (INDEPENDENT_AMBULATORY_CARE_PROVIDER_SITE_OTHER): Payer: Medicare Other | Admitting: Internal Medicine

## 2016-03-07 VITALS — BP 130/80 | HR 69 | Temp 98.2°F | Ht 64.5 in | Wt 175.0 lb

## 2016-03-07 DIAGNOSIS — E039 Hypothyroidism, unspecified: Secondary | ICD-10-CM | POA: Diagnosis not present

## 2016-03-07 DIAGNOSIS — Z7189 Other specified counseling: Secondary | ICD-10-CM

## 2016-03-07 DIAGNOSIS — R7301 Impaired fasting glucose: Secondary | ICD-10-CM | POA: Diagnosis not present

## 2016-03-07 DIAGNOSIS — Z0000 Encounter for general adult medical examination without abnormal findings: Secondary | ICD-10-CM | POA: Diagnosis not present

## 2016-03-07 DIAGNOSIS — I10 Essential (primary) hypertension: Secondary | ICD-10-CM | POA: Diagnosis not present

## 2016-03-07 DIAGNOSIS — Z23 Encounter for immunization: Secondary | ICD-10-CM

## 2016-03-07 DIAGNOSIS — K219 Gastro-esophageal reflux disease without esophagitis: Secondary | ICD-10-CM

## 2016-03-07 NOTE — Assessment & Plan Note (Signed)
Will continue the metformin

## 2016-03-07 NOTE — Assessment & Plan Note (Signed)
I have personally reviewed the Medicare Annual Wellness questionnaire and have noted 1. The patient's medical and social history 2. Their use of alcohol, tobacco or illicit drugs 3. Their current medications and supplements 4. The patient's functional ability including ADL's, fall risks, home safety risks and hearing or visual             impairment. 5. Diet and physical activities 6. Evidence for depression or mood disorders  The patients weight, height, BMI and visual acuity have been recorded in the chart I have made referrals, counseling and provided education to the patient based review of the above and I have provided the pt with a written personalized care plan for preventive services.  I have provided you with a copy of your personalized plan for preventive services. Please take the time to review along with your updated medication list.  Flu vaccine today Will check past colonoscopy--and decide if further colon screening is needed Mammogram due next year

## 2016-03-07 NOTE — Progress Notes (Signed)
Pre visit review using our clinic review tool, if applicable. No additional management support is needed unless otherwise documented below in the visit note. 

## 2016-03-07 NOTE — Assessment & Plan Note (Signed)
BP Readings from Last 3 Encounters:  03/07/16 130/80  05/25/15 140/72  03/05/15 130/80   Good control No changes needed

## 2016-03-07 NOTE — Assessment & Plan Note (Signed)
Again urged her to do formal POA
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
## 2016-03-07 NOTE — Assessment & Plan Note (Signed)
Okay on the med

## 2016-03-07 NOTE — Assessment & Plan Note (Signed)
No problems on replacement

## 2016-03-08 LAB — CBC WITH DIFFERENTIAL/PLATELET
Basophils Absolute: 0.1 10*3/uL (ref 0.0–0.1)
Basophils Relative: 0.5 % (ref 0.0–3.0)
EOS ABS: 0.2 10*3/uL (ref 0.0–0.7)
Eosinophils Relative: 2.3 % (ref 0.0–5.0)
HCT: 40.3 % (ref 36.0–46.0)
HEMOGLOBIN: 13.5 g/dL (ref 12.0–15.0)
Lymphocytes Relative: 23.8 % (ref 12.0–46.0)
Lymphs Abs: 2.4 10*3/uL (ref 0.7–4.0)
MCHC: 33.5 g/dL (ref 30.0–36.0)
MCV: 88.3 fl (ref 78.0–100.0)
MONO ABS: 0.6 10*3/uL (ref 0.1–1.0)
Monocytes Relative: 5.5 % (ref 3.0–12.0)
Neutro Abs: 6.8 10*3/uL (ref 1.4–7.7)
Neutrophils Relative %: 67.9 % (ref 43.0–77.0)
Platelets: 239 10*3/uL (ref 150.0–400.0)
RBC: 4.56 Mil/uL (ref 3.87–5.11)
RDW: 13.3 % (ref 11.5–15.5)
WBC: 10 10*3/uL (ref 4.0–10.5)

## 2016-03-08 LAB — COMPREHENSIVE METABOLIC PANEL
ALBUMIN: 4.4 g/dL (ref 3.5–5.2)
ALK PHOS: 64 U/L (ref 39–117)
ALT: 18 U/L (ref 0–35)
AST: 19 U/L (ref 0–37)
BILIRUBIN TOTAL: 0.5 mg/dL (ref 0.2–1.2)
BUN: 18 mg/dL (ref 6–23)
CO2: 30 mEq/L (ref 19–32)
CREATININE: 1.06 mg/dL (ref 0.40–1.20)
Calcium: 9.7 mg/dL (ref 8.4–10.5)
Chloride: 101 mEq/L (ref 96–112)
GFR: 53.87 mL/min — ABNORMAL LOW (ref >60.00)
Glucose, Bld: 82 mg/dL (ref 70–99)
Potassium: 4.6 mEq/L (ref 3.5–5.1)
SODIUM: 138 meq/L (ref 135–145)
TOTAL PROTEIN: 7.1 g/dL (ref 6.0–8.3)

## 2016-03-08 LAB — LIPID PANEL
CHOLESTEROL: 172 mg/dL (ref 0–200)
HDL: 57.1 mg/dL (ref >39.00)
LDL Cholesterol: 94 mg/dL (ref 0–99)
NonHDL: 114.77
Total CHOL/HDL Ratio: 3
Triglycerides: 103 mg/dL (ref 0.0–149.0)
VLDL: 20.6 mg/dL (ref 0.0–40.0)

## 2016-03-08 LAB — TSH: TSH: 4.49 u[IU]/mL (ref 0.35–4.50)

## 2016-03-08 LAB — HEMOGLOBIN A1C: HEMOGLOBIN A1C: 6.3 % (ref 4.6–6.5)

## 2016-03-08 LAB — T4, FREE: FREE T4: 0.91 ng/dL (ref 0.60–1.60)

## 2016-03-13 ENCOUNTER — Other Ambulatory Visit: Payer: Self-pay | Admitting: Internal Medicine

## 2016-03-17 ENCOUNTER — Encounter: Payer: Medicare Other | Admitting: Internal Medicine

## 2016-05-04 DIAGNOSIS — K219 Gastro-esophageal reflux disease without esophagitis: Secondary | ICD-10-CM | POA: Diagnosis not present

## 2016-05-04 DIAGNOSIS — Z86010 Personal history of colonic polyps: Secondary | ICD-10-CM | POA: Diagnosis not present

## 2016-05-04 DIAGNOSIS — Z9289 Personal history of other medical treatment: Secondary | ICD-10-CM | POA: Diagnosis not present

## 2016-05-26 ENCOUNTER — Other Ambulatory Visit: Payer: Self-pay

## 2016-05-26 MED ORDER — ATORVASTATIN CALCIUM 80 MG PO TABS: ORAL_TABLET | ORAL | 3 refills | Status: DC

## 2016-05-26 MED ORDER — VALSARTAN 320 MG PO TABS: 320.0000 mg | ORAL_TABLET | Freq: Every day | ORAL | 3 refills | Status: DC

## 2016-05-26 MED ORDER — LEVOTHYROXINE SODIUM 75 MCG PO TABS: 75.0000 ug | ORAL_TABLET | Freq: Every day | ORAL | 3 refills | Status: DC

## 2016-05-26 MED ORDER — OMEPRAZOLE 40 MG PO CPDR: 40.0000 mg | DELAYED_RELEASE_CAPSULE | Freq: Every day | ORAL | 3 refills | Status: DC

## 2016-05-26 NOTE — Telephone Encounter (Signed)
Pt had annual 03/07/16 and request refills atorvastatin, levothyroxine, omeprazole [facility]artan [facility]; advised pt done.

## 2016-07-15 ENCOUNTER — Encounter: Payer: Self-pay | Admitting: *Deleted

## 2016-07-18 ENCOUNTER — Ambulatory Visit: Payer: Medicare Other | Admitting: Certified Registered Nurse Anesthetist

## 2016-07-18 ENCOUNTER — Ambulatory Visit
Admission: RE | Admit: 2016-07-18 | Discharge: 2016-07-18 | Disposition: A | Discharge status: Home / Self Care | Payer: Medicare Other | Source: Ambulatory Visit | Attending: Unknown Physician Specialty | Admitting: Unknown Physician Specialty

## 2016-07-18 ENCOUNTER — Encounter
Admission: RE | Disposition: A | Discharge status: Home / Self Care | Payer: Self-pay | Source: Ambulatory Visit | Attending: Unknown Physician Specialty

## 2016-07-18 DIAGNOSIS — Z8719 Personal history of other diseases of the digestive system: Secondary | ICD-10-CM | POA: Diagnosis not present

## 2016-07-18 DIAGNOSIS — Z79899 Other long term (current) drug therapy: Secondary | ICD-10-CM | POA: Insufficient documentation

## 2016-07-18 DIAGNOSIS — E785 Hyperlipidemia, unspecified: Secondary | ICD-10-CM | POA: Insufficient documentation

## 2016-07-18 DIAGNOSIS — K5730 Diverticulosis of large intestine without perforation or abscess without bleeding: Secondary | ICD-10-CM | POA: Diagnosis not present

## 2016-07-18 DIAGNOSIS — Z1211 Encounter for screening for malignant neoplasm of colon: Secondary | ICD-10-CM | POA: Diagnosis not present

## 2016-07-18 DIAGNOSIS — K219 Gastro-esophageal reflux disease without esophagitis: Secondary | ICD-10-CM | POA: Insufficient documentation

## 2016-07-18 DIAGNOSIS — Z791 Long term (current) use of non-steroidal anti-inflammatories (NSAID): Secondary | ICD-10-CM | POA: Insufficient documentation

## 2016-07-18 DIAGNOSIS — E039 Hypothyroidism, unspecified: Secondary | ICD-10-CM | POA: Insufficient documentation

## 2016-07-18 DIAGNOSIS — I10 Essential (primary) hypertension: Secondary | ICD-10-CM | POA: Diagnosis not present

## 2016-07-18 DIAGNOSIS — K648 Other hemorrhoids: Secondary | ICD-10-CM | POA: Diagnosis not present

## 2016-07-18 DIAGNOSIS — Z7984 Long term (current) use of oral hypoglycemic drugs: Secondary | ICD-10-CM | POA: Insufficient documentation

## 2016-07-18 DIAGNOSIS — E119 Type 2 diabetes mellitus without complications: Secondary | ICD-10-CM | POA: Diagnosis not present

## 2016-07-18 DIAGNOSIS — K5790 Diverticulosis of intestine, part unspecified, without perforation or abscess without bleeding: Secondary | ICD-10-CM | POA: Diagnosis not present

## 2016-07-18 DIAGNOSIS — Z86010 Personal history of colonic polyps: Secondary | ICD-10-CM | POA: Diagnosis not present

## 2016-07-18 DIAGNOSIS — K640 First degree hemorrhoids: Secondary | ICD-10-CM | POA: Insufficient documentation

## 2016-07-18 HISTORY — DX: Malignant (primary) neoplasm, unspecified: C80.1

## 2016-07-18 HISTORY — DX: Personal history of other medical treatment: Z92.89

## 2016-07-18 HISTORY — DX: Type 2 diabetes mellitus without complications: E11.9

## 2016-07-18 HISTORY — PX: COLONOSCOPY WITH PROPOFOL: SHX5780

## 2016-07-18 LAB — GLUCOSE, CAPILLARY: GLUCOSE-CAPILLARY: 113 mg/dL — AB (ref 65–99)

## 2016-07-18 SURGERY — COLONOSCOPY WITH PROPOFOL
Anesthesia: General

## 2016-07-18 MED ORDER — PHENYLEPHRINE HCL 10 MG/ML IJ SOLN
INTRAMUSCULAR | Status: DC | PRN
  Administered 2016-07-18: 100 ug via INTRAVENOUS

## 2016-07-18 MED ORDER — MIDAZOLAM HCL 2 MG/2ML IJ SOLN
INTRAMUSCULAR | Status: DC | PRN
  Administered 2016-07-18: 2 mg via INTRAVENOUS

## 2016-07-18 MED ORDER — SODIUM CHLORIDE 0.9 % IV SOLN
INTRAVENOUS | Status: DC
  Administered 2016-07-18: 09:00:00 via INTRAVENOUS

## 2016-07-18 MED ORDER — LIDOCAINE HCL (CARDIAC) 20 MG/ML IV SOLN
INTRAVENOUS | Status: DC | PRN
  Administered 2016-07-18: 50 mg via INTRAVENOUS

## 2016-07-18 MED ORDER — LIDOCAINE HCL (PF) 2 % IJ SOLN
INTRAMUSCULAR | Status: AC
  Filled 2016-07-18: qty 2

## 2016-07-18 MED ORDER — PROPOFOL 500 MG/50ML IV EMUL
INTRAVENOUS | Status: DC | PRN
  Administered 2016-07-18: 140 ug/kg/min via INTRAVENOUS

## 2016-07-18 MED ORDER — MIDAZOLAM HCL 2 MG/2ML IJ SOLN
INTRAMUSCULAR | Status: AC
  Filled 2016-07-18: qty 2

## 2016-07-18 MED ORDER — PROPOFOL 10 MG/ML IV BOLUS
INTRAVENOUS | Status: DC | PRN
Start: 2016-07-18 — End: 2016-07-18
  Administered 2016-07-18 (×2): 20 mg via INTRAVENOUS
  Administered 2016-07-18: 40 mg via INTRAVENOUS

## 2016-07-18 MED ORDER — SODIUM CHLORIDE 0.9 % IV SOLN: INTRAVENOUS | Status: DC

## 2016-07-18 MED ORDER — PROPOFOL 500 MG/50ML IV EMUL
INTRAVENOUS | Status: AC
  Filled 2016-07-18: qty 50

## 2016-07-18 NOTE — Transfer of Care (Signed)
Immediate Anesthesia Transfer of Care Note  Patient: Kalisi Bevill Allbritton  Procedure(s) Performed: Procedure(s): COLONOSCOPY WITH PROPOFOL (N/A)  Patient Location: PACU  Anesthesia Type:General  Level of Consciousness: sedated  Airway & Oxygen Therapy: Patient Spontanous Breathing and Patient connected to nasal cannula oxygen  Post-op Assessment: Report given to RN and Post -op Vital signs reviewed and stable  Post vital signs: Reviewed and stable  Last Vitals:  Vitals:   07/18/16 1020 07/18/16 1030  BP: 92/62 (!) 83/51  Pulse: 60 (!) 59  Resp: 13 11  Temp: 36.2 C 36.2 C    Last Pain:  Vitals:   07/18/16 1030  TempSrc: Tympanic         Complications: No apparent anesthesia complications

## 2016-07-18 NOTE — Anesthesia Procedure Notes (Signed)
Date/Time: 07/18/2016 9:59 AM Performed by: Johnna Acosta Pre-anesthesia Checklist: Patient identified, Emergency Drugs available, Suction available, Patient being monitored and Timeout performed Patient Re-evaluated:Patient Re-evaluated prior to inductionOxygen Delivery Method: Nasal cannula

## 2016-07-18 NOTE — Op Note (Signed)
[facility] Patient Name: Sandra Montgomery Procedure Date: 07/18/2016 9:57 AM MRN: [id number] Account #: [id number] Date of Birth: [birth year] Admit Type: Outpatient Age: 76 Room: [facility] ENDO ROOM 1 Gender: Female Note Status: Finalized Procedure:            Colonoscopy Indications:          High risk colon cancer surveillance: Personal history                        of colonic polyps Providers:            Manya Silvas, MD Referring MD:         Venia Carbon (Referring MD) Medicines:            Propofol per Anesthesia Complications:        No immediate complications. Procedure:            Pre-Anesthesia Assessment:                       - After reviewing the risks and benefits, the patient                        was deemed in satisfactory condition to undergo the                        procedure.                       After obtaining informed consent, the colonoscope was                        passed under direct vision. Throughout the procedure,                        the patient's blood pressure, pulse, and oxygen                        saturations were monitored continuously. The                        Colonoscope was introduced through the anus and                        advanced to the the cecum, identified by appendiceal                        orifice and ileocecal valve. The colonoscopy was                        performed without difficulty. The patient tolerated the                        procedure well. The quality of the bowel preparation                        was adequate to identify polyps. Findings:      A few small-mouthed diverticula were found in the sigmoid colon.      Internal hemorrhoids were found during endoscopy. The hemorrhoids were       small and Grade I (internal hemorrhoids that do not prolapse).      The  exam was otherwise without abnormality. Impression:           - Diverticulosis in the sigmoid colon.                  - Internal hemorrhoids.                       - The examination was otherwise normal.                       - No specimens collected. Recommendation:       - The findings and recommendations were discussed with                        the patient's family. Manya Silvas, MD 07/18/2016 10:26:33 AM This report has been signed electronically. Number of Addenda: 0 Note Initiated On: 07/18/2016 9:57 AM Scope Withdrawal Time: 0 hours 11 minutes 24 seconds  Total Procedure Duration: 0 hours 22 minutes 0 seconds       [facility]

## 2016-07-18 NOTE — Anesthesia Post-op Follow-up Note (Cosign Needed)
Anesthesia QCDR form completed.        
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
## 2016-07-18 NOTE — Anesthesia Preprocedure Evaluation (Signed)
Anesthesia Evaluation  Patient identified by MRN, date of birth, ID band Patient awake    Reviewed: Allergy & Precautions, H&P , NPO status , Patient's Chart, lab work & pertinent test results, reviewed documented beta blocker date and time   History of Anesthesia Complications Negative for: history of anesthetic complications  Airway Mallampati: II  TM Distance: >3 FB Neck ROM: full    Dental  (+) Caps   Pulmonary neg pulmonary ROS,           Cardiovascular Exercise Tolerance: Good hypertension, (-) angina(-) CAD, (-) Past MI, (-) Cardiac Stents and (-) CABG (-) dysrhythmias + Valvular Problems/Murmurs      Neuro/Psych negative neurological ROS  negative psych ROS   GI/Hepatic Neg liver ROS, GERD  ,  Endo/Other  diabetesHypothyroidism   Renal/GU negative Renal ROS  negative genitourinary   Musculoskeletal   Abdominal   Peds  Hematology negative hematology ROS (+)   Anesthesia Other Findings Past Medical History: No date: Allergic rhinitis, cause unspecified No date: Cancer [facility])     Comment: shoulder carcinoma No date: Diabetes mellitus without complication (HCC) No date: GERD (gastroesophageal reflux disease) No date: History of blood transfusion No date: Hyperlipidemia No date: Hypertension No date: Impaired fasting glucose No date: Osteoarthrosis involving, or with mention of m* No date: Osteoporosis 1965: Pneumothorax     Comment: prolonged bed rest  No date: Unspecified hypothyroidism   Reproductive/Obstetrics negative OB ROS                             Anesthesia Physical Anesthesia Plan  ASA: III  Anesthesia Plan: General   Post-op Pain Management:    Induction:   Airway Management Planned:   Additional Equipment:   Intra-op Plan:   Post-operative Plan:   Informed Consent: I have reviewed the patients History and Physical, chart, labs and discussed the  procedure including the risks, benefits and alternatives for the proposed anesthesia with the patient or authorized representative who has indicated his/her understanding and acceptance.   Dental Advisory Given  Plan Discussed with: Anesthesiologist, CRNA and Surgeon  Anesthesia Plan Comments:         Anesthesia Quick Evaluation

## 2016-07-19 ENCOUNTER — Encounter: Payer: Self-pay | Admitting: Unknown Physician Specialty

## 2016-07-25 NOTE — Anesthesia Postprocedure Evaluation (Signed)
Anesthesia Post Note  Patient: Sandra Montgomery  Procedure(s) Performed: Procedure(s) (LRB): COLONOSCOPY WITH PROPOFOL (N/A)  Patient location during evaluation: Endoscopy Anesthesia Type: General Level of consciousness: awake and alert Pain management: pain level controlled Vital Signs Assessment: post-procedure vital signs reviewed and stable Respiratory status: spontaneous breathing, nonlabored ventilation, respiratory function stable and patient connected to nasal cannula oxygen Cardiovascular status: blood pressure returned to baseline and stable Postop Assessment: no signs of nausea or vomiting Anesthetic complications: no     Last Vitals:  Vitals:   07/18/16 1040 07/18/16 1100  BP: (!) 104/57 115/75  Pulse: (!) 57 (!) 58  Resp: 13 17  Temp:      Last Pain:  Vitals:   07/18/16 1030  TempSrc: Tympanic                 Martha Clan

## 2016-08-08 ENCOUNTER — Ambulatory Visit (INDEPENDENT_AMBULATORY_CARE_PROVIDER_SITE_OTHER): Payer: Medicare Other | Admitting: Internal Medicine

## 2016-08-08 ENCOUNTER — Encounter: Payer: Self-pay | Admitting: Internal Medicine

## 2016-08-08 VITALS — BP 118/64 | HR 66 | Temp 97.7°F | Resp 10 | Wt 170.5 lb

## 2016-08-08 DIAGNOSIS — R059 Cough, unspecified: Secondary | ICD-10-CM

## 2016-08-08 DIAGNOSIS — R05 Cough: Secondary | ICD-10-CM

## 2016-08-08 NOTE — Progress Notes (Signed)
Pre visit review using our clinic review tool, if applicable. No additional management support is needed unless otherwise documented below in the visit note. 
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
## 2016-08-08 NOTE — Assessment & Plan Note (Signed)
Seems to be viral Discussed supportive care (has tessalon) Discussed signs of secondary bacterial infection

## 2016-08-22 ENCOUNTER — Other Ambulatory Visit: Payer: Self-pay | Admitting: Internal Medicine

## 2017-02-21 DIAGNOSIS — H2513 Age-related nuclear cataract, bilateral: Secondary | ICD-10-CM | POA: Diagnosis not present

## 2017-02-27 ENCOUNTER — Other Ambulatory Visit: Payer: Self-pay | Admitting: Internal Medicine

## 2017-03-08 DIAGNOSIS — Z23 Encounter for immunization: Secondary | ICD-10-CM | POA: Diagnosis not present

## 2017-03-20 ENCOUNTER — Ambulatory Visit (INDEPENDENT_AMBULATORY_CARE_PROVIDER_SITE_OTHER): Payer: Medicare Other | Admitting: Internal Medicine

## 2017-03-20 ENCOUNTER — Encounter: Payer: Self-pay | Admitting: Internal Medicine

## 2017-03-20 VITALS — BP 102/70 | HR 71 | Temp 97.8°F | Ht 64.0 in | Wt 167.0 lb

## 2017-03-20 DIAGNOSIS — E039 Hypothyroidism, unspecified: Secondary | ICD-10-CM | POA: Diagnosis not present

## 2017-03-20 DIAGNOSIS — E785 Hyperlipidemia, unspecified: Secondary | ICD-10-CM

## 2017-03-20 DIAGNOSIS — R7303 Prediabetes: Secondary | ICD-10-CM | POA: Diagnosis not present

## 2017-03-20 DIAGNOSIS — I10 Essential (primary) hypertension: Secondary | ICD-10-CM | POA: Diagnosis not present

## 2017-03-20 DIAGNOSIS — M150 Primary generalized (osteo)arthritis: Secondary | ICD-10-CM | POA: Diagnosis not present

## 2017-03-20 DIAGNOSIS — Z0000 Encounter for general adult medical examination without abnormal findings: Secondary | ICD-10-CM

## 2017-03-20 DIAGNOSIS — Z7189 Other specified counseling: Secondary | ICD-10-CM | POA: Diagnosis not present

## 2017-03-20 DIAGNOSIS — M159 Polyosteoarthritis, unspecified: Secondary | ICD-10-CM

## 2017-03-20 NOTE — Progress Notes (Signed)
Hearing Screening   Method: Audiometry   125Hz  250Hz  500Hz  1000Hz  2000Hz  3000Hz  4000Hz  6000Hz  8000Hz   Right ear:   25 40 20  20    Left ear:   25 40 20  20    Vision Screening Comments: December 2018

## 2017-03-20 NOTE — Assessment & Plan Note (Signed)
Seems euthyroid

## 2017-03-20 NOTE — Assessment & Plan Note (Signed)
BP Readings from Last 3 Encounters:  03/20/17 102/70  08/08/16 118/64  07/18/16 115/75   Good control

## 2017-03-20 NOTE — Assessment & Plan Note (Signed)
No problems with statin for primary prevention 

## 2017-03-20 NOTE — Patient Instructions (Signed)
Please set up your screening mammogram. 

## 2017-03-20 NOTE — Assessment & Plan Note (Signed)
I have personally reviewed the Medicare Annual Wellness questionnaire and have noted 1. The patient's medical and social history 2. Their use of alcohol, tobacco or illicit drugs 3. Their current medications and supplements 4. The patient's functional ability including ADL's, fall risks, home safety risks and hearing or visual             impairment. 5. Diet and physical activities 6. Evidence for depression or mood disorders  The patients weight, height, BMI and visual acuity have been recorded in the chart I have made referrals, counseling and provided education to the patient based review of the above and I have provided the pt with a written personalized care plan for preventive services.  I have provided you with a copy of your personalized plan for preventive services. Please take the time to review along with your updated medication list.  Will need pneumovax booster (just had flu shot though) Just had colon Due for mammogram Discussed fitness Will consider shingrix Yearly flu vaccine

## 2017-03-20 NOTE — Assessment & Plan Note (Signed)
See social history 
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
## 2017-03-20 NOTE — Assessment & Plan Note (Signed)
Uses aleve daily On PPI

## 2017-03-20 NOTE — Assessment & Plan Note (Signed)
On metformin Will check labs 

## 2017-03-21 LAB — COMPREHENSIVE METABOLIC PANEL
ALK PHOS: 70 U/L (ref 39–117)
ALT: 15 U/L (ref 0–35)
AST: 18 U/L (ref 0–37)
Albumin: 4.4 g/dL (ref 3.5–5.2)
BILIRUBIN TOTAL: 0.6 mg/dL (ref 0.2–1.2)
BUN: 30 mg/dL — AB (ref 6–23)
CO2: 27 meq/L (ref 19–32)
Calcium: 9.6 mg/dL (ref 8.4–10.5)
Chloride: 99 mEq/L (ref 96–112)
Creatinine, Ser: 1.25 mg/dL — ABNORMAL HIGH (ref 0.40–1.20)
GFR: 44.41 mL/min — ABNORMAL LOW (ref >60.00)
GLUCOSE: 75 mg/dL (ref 70–99)
Potassium: 4.6 mEq/L (ref 3.5–5.1)
SODIUM: 135 meq/L (ref 135–145)
TOTAL PROTEIN: 7.2 g/dL (ref 6.0–8.3)

## 2017-03-21 LAB — LIPID PANEL
CHOL/HDL RATIO: 3
CHOLESTEROL: 155 mg/dL (ref 0–200)
HDL: 55.1 mg/dL (ref >39.00)
LDL Cholesterol: 82 mg/dL (ref 0–99)
NonHDL: 100.39
TRIGLYCERIDES: 94 mg/dL (ref 0.0–149.0)
VLDL: 18.8 mg/dL (ref 0.0–40.0)

## 2017-03-21 LAB — CBC
HEMATOCRIT: 40.6 % (ref 36.0–46.0)
Hemoglobin: 13.1 g/dL (ref 12.0–15.0)
MCHC: 32.2 g/dL (ref 30.0–36.0)
MCV: 89.9 fl (ref 78.0–100.0)
PLATELETS: 278 10*3/uL (ref 150.0–400.0)
RBC: 4.52 Mil/uL (ref 3.87–5.11)
RDW: 14.2 % (ref 11.5–15.5)
WBC: 9.3 10*3/uL (ref 4.0–10.5)

## 2017-03-21 LAB — T4, FREE: FREE T4: 1.64 ng/dL — AB (ref 0.60–1.60)

## 2017-03-21 LAB — HEMOGLOBIN A1C: Hgb A1c MFr Bld: 6.5 % (ref 4.6–6.5)

## 2017-03-21 LAB — TSH: TSH: 1.71 u[IU]/mL (ref 0.35–4.50)

## 2017-04-13 DIAGNOSIS — H2512 Age-related nuclear cataract, left eye: Secondary | ICD-10-CM | POA: Diagnosis not present

## 2017-04-18 ENCOUNTER — Other Ambulatory Visit: Payer: Self-pay

## 2017-04-18 ENCOUNTER — Encounter: Payer: Self-pay | Admitting: *Deleted

## 2017-04-25 NOTE — Discharge Instructions (Signed)

## 2017-04-26 ENCOUNTER — Ambulatory Visit: Payer: Medicare Other | Admitting: Anesthesiology

## 2017-04-26 ENCOUNTER — Encounter
Admission: RE | Disposition: A | Discharge status: Home / Self Care | Payer: Self-pay | Source: Ambulatory Visit | Attending: Ophthalmology

## 2017-04-26 ENCOUNTER — Ambulatory Visit
Admission: RE | Admit: 2017-04-26 | Discharge: 2017-04-26 | Disposition: A | Discharge status: Home / Self Care | Payer: Medicare Other | Source: Ambulatory Visit | Attending: Ophthalmology | Admitting: Ophthalmology

## 2017-04-26 DIAGNOSIS — M810 Age-related osteoporosis without current pathological fracture: Secondary | ICD-10-CM | POA: Diagnosis not present

## 2017-04-26 DIAGNOSIS — E119 Type 2 diabetes mellitus without complications: Secondary | ICD-10-CM | POA: Diagnosis not present

## 2017-04-26 DIAGNOSIS — H2512 Age-related nuclear cataract, left eye: Secondary | ICD-10-CM | POA: Diagnosis not present

## 2017-04-26 DIAGNOSIS — M1990 Unspecified osteoarthritis, unspecified site: Secondary | ICD-10-CM | POA: Diagnosis not present

## 2017-04-26 DIAGNOSIS — I10 Essential (primary) hypertension: Secondary | ICD-10-CM | POA: Diagnosis not present

## 2017-04-26 HISTORY — PX: CATARACT EXTRACTION W/PHACO: SHX586

## 2017-04-26 LAB — GLUCOSE, CAPILLARY
GLUCOSE-CAPILLARY: 117 mg/dL — AB (ref 65–99)
Glucose-Capillary: 94 mg/dL (ref 65–99)

## 2017-04-26 SURGERY — PHACOEMULSIFICATION, CATARACT, WITH IOL INSERTION
Anesthesia: Monitor Anesthesia Care | Laterality: Left

## 2017-04-26 MED ORDER — MOXIFLOXACIN HCL 0.5 % OP SOLN
1.0000 [drp] | OPHTHALMIC | Status: DC | PRN
  Administered 2017-04-26 (×3): 1 [drp] via OPHTHALMIC

## 2017-04-26 MED ORDER — FENTANYL CITRATE (PF) 100 MCG/2ML IJ SOLN
INTRAMUSCULAR | Status: DC | PRN
  Administered 2017-04-26: 50 ug via INTRAVENOUS

## 2017-04-26 MED ORDER — LIDOCAINE HCL (PF) 2 % IJ SOLN
INTRAOCULAR | Status: DC | PRN
  Administered 2017-04-26: 1 mL

## 2017-04-26 MED ORDER — MIDAZOLAM HCL 2 MG/2ML IJ SOLN
INTRAMUSCULAR | Status: DC | PRN
  Administered 2017-04-26: 1 mg via INTRAVENOUS

## 2017-04-26 MED ORDER — CEFUROXIME OPHTHALMIC INJECTION 1 MG/0.1 ML
INJECTION | OPHTHALMIC | Status: DC | PRN
  Administered 2017-04-26: 0.1 mL via INTRACAMERAL

## 2017-04-26 MED ORDER — LACTATED RINGERS IV SOLN: INTRAVENOUS | Status: DC

## 2017-04-26 MED ORDER — LACTATED RINGERS IV SOLN: 500.0000 mL | INTRAVENOUS | Status: DC

## 2017-04-26 MED ORDER — NA HYALUR & NA CHOND-NA HYALUR 0.4-0.35 ML IO KIT
PACK | INTRAOCULAR | Status: DC | PRN
  Administered 2017-04-26: 1 mL via INTRAOCULAR

## 2017-04-26 MED ORDER — BRIMONIDINE TARTRATE-TIMOLOL 0.2-0.5 % OP SOLN
OPHTHALMIC | Status: DC | PRN
  Administered 2017-04-26: 1 [drp] via OPHTHALMIC

## 2017-04-26 MED ORDER — ARMC OPHTHALMIC DILATING DROPS
1.0000 "application " | OPHTHALMIC | Status: DC | PRN
  Administered 2017-04-26 (×3): 1 via OPHTHALMIC

## 2017-04-26 MED ORDER — EPINEPHRINE PF 1 MG/ML IJ SOLN
INTRAOCULAR | Status: DC | PRN
  Administered 2017-04-26: 55 mL via OPHTHALMIC

## 2017-04-26 SURGICAL SUPPLY — 25 items
CANNULA ANT/CHMB 27GA (MISCELLANEOUS) ×2 IMPLANT
CARTRIDGE ABBOTT (MISCELLANEOUS) IMPLANT
GLOVE SURG LX 7.5 STRW (GLOVE) ×1
GLOVE SURG LX STRL 7.5 STRW (GLOVE) ×1 IMPLANT
GLOVE SURG TRIUMPH 8.0 PF LTX (GLOVE) ×2 IMPLANT
GOWN STRL REUS W/ TWL LRG LVL3 (GOWN DISPOSABLE) ×2 IMPLANT
GOWN STRL REUS W/TWL LRG LVL3 (GOWN DISPOSABLE) ×2
LENS IOL TECNIS ITEC 24.0 (Intraocular Lens) ×2 IMPLANT
MARKER SKIN DUAL TIP RULER LAB (MISCELLANEOUS) ×2 IMPLANT
NDL RETROBULBAR .5 NSTRL (NEEDLE) IMPLANT
NEEDLE FILTER BLUNT 18X 1/2SAF (NEEDLE) ×1
NEEDLE FILTER BLUNT 18X1 1/2 (NEEDLE) ×1 IMPLANT
PACK CATARACT BRASINGTON (MISCELLANEOUS) ×2 IMPLANT
PACK EYE AFTER SURG (MISCELLANEOUS) ×2 IMPLANT
PACK OPTHALMIC (MISCELLANEOUS) ×2 IMPLANT
RING MALYGIN 7.0 (MISCELLANEOUS) IMPLANT
SUT ETHILON 10-0 CS-B-6CS-B-6 (SUTURE)
SUT VICRYL  9 0 (SUTURE)
SUT VICRYL 9 0 (SUTURE) IMPLANT
SUTURE EHLN 10-0 CS-B-6CS-B-6 (SUTURE) IMPLANT
SYR 3ML LL SCALE MARK (SYRINGE) ×2 IMPLANT
SYR 5ML LL (SYRINGE) ×2 IMPLANT
SYR TB 1ML LUER SLIP (SYRINGE) ×2 IMPLANT
WATER STERILE IRR 250ML POUR (IV SOLUTION) ×2 IMPLANT
WIPE NON LINTING 3.25X3.25 (MISCELLANEOUS) ×2 IMPLANT

## 2017-04-26 NOTE — Anesthesia Preprocedure Evaluation (Signed)
Anesthesia Evaluation  Patient identified by MRN, date of birth, ID band Patient awake    Reviewed: Allergy & Precautions, H&P , NPO status , Patient's Chart, lab work & pertinent test results, reviewed documented beta blocker date and time   History of Anesthesia Complications Negative for: history of anesthetic complications  Airway Mallampati: II  TM Distance: >3 FB Neck ROM: full    Dental  (+) Caps   Pulmonary neg pulmonary ROS,           Cardiovascular Exercise Tolerance: Good hypertension, (-) angina(-) CAD, (-) Past MI, (-) Cardiac Stents and (-) CABG (-) dysrhythmias + Valvular Problems/Murmurs      Neuro/Psych negative neurological ROS  negative psych ROS   GI/Hepatic Neg liver ROS, GERD  ,  Endo/Other  diabetesHypothyroidism   Renal/GU negative Renal ROS  negative genitourinary   Musculoskeletal   Abdominal   Peds  Hematology negative hematology ROS (+)   Anesthesia Other Findings Past Medical History: No date: Allergic rhinitis, cause unspecified No date: Cancer [facility])     Comment: shoulder carcinoma No date: Diabetes mellitus without complication (HCC) No date: GERD (gastroesophageal reflux disease) No date: History of blood transfusion No date: Hyperlipidemia No date: Hypertension No date: Impaired fasting glucose No date: Osteoarthrosis involving, or with mention of m* No date: Osteoporosis 1965: Pneumothorax     Comment: prolonged bed rest  No date: Unspecified hypothyroidism   Reproductive/Obstetrics negative OB ROS                             Anesthesia Physical  Anesthesia Plan  ASA: III  Anesthesia Plan: MAC   Post-op Pain Management:    Induction:   PONV Risk Score and Plan: 2 and Midazolam  Airway Management Planned:   Additional Equipment:   Intra-op Plan:   Post-operative Plan:   Informed Consent: I have reviewed the patients History and  Physical, chart, labs and discussed the procedure including the risks, benefits and alternatives for the proposed anesthesia with the patient or authorized representative who has indicated his/her understanding and acceptance.     Plan Discussed with: CRNA  Anesthesia Plan Comments:         Anesthesia Quick Evaluation

## 2017-04-26 NOTE — H&P (Signed)
The History and Physical notes are on paper, have been signed, and are to be scanned. The patient remains stable and unchanged from the H&P.   Previous H&P reviewed, patient examined, and there are no changes.  Sandra Montgomery 04/26/2017 7:40 AM

## 2017-04-26 NOTE — Anesthesia Postprocedure Evaluation (Signed)
Anesthesia Post Note  Patient: Sandra Montgomery  Procedure(s) Performed: CATARACT EXTRACTION PHACO AND INTRAOCULAR LENS PLACEMENT (IOC)  LEFT (Left )  Patient location during evaluation: PACU Anesthesia Type: MAC Level of consciousness: awake and alert Pain management: pain level controlled Vital Signs Assessment: post-procedure vital signs reviewed and stable Respiratory status: spontaneous breathing, nonlabored ventilation and respiratory function stable Cardiovascular status: stable and blood pressure returned to baseline Postop Assessment: no apparent nausea or vomiting Anesthetic complications: no    DANIEL D KOVACS

## 2017-04-26 NOTE — Op Note (Signed)
OPERATIVE NOTE  Sandra Montgomery [facility] [id number] 04/26/2017   PREOPERATIVE DIAGNOSIS:  Nuclear sclerotic cataract left eye. H25.12   POSTOPERATIVE DIAGNOSIS:    Nuclear sclerotic cataract left eye.     PROCEDURE:  Phacoemusification with posterior chamber intraocular lens placement of the left eye   LENS:   Implant Name Type Inv. Item Serial No. Manufacturer Lot No. LRB No. Used  LENS IOL DIOP 24.0 - [id number] Intraocular Lens LENS IOL DIOP 24.0 [id number]  Left 1        ULTRASOUND TIME: 18  % of 1 minutes 4 seconds, CDE 11.6  SURGEON:  Wyonia Hough, MD   ANESTHESIA:  Topical with tetracaine drops and 2% Xylocaine jelly, augmented with 1% preservative-free intracameral lidocaine.    COMPLICATIONS:  None.   DESCRIPTION OF PROCEDURE:  The patient was identified in the holding room and transported to the operating room and placed in the supine position under the operating microscope.  The left eye was identified as the operative eye and it was prepped and draped in the usual sterile ophthalmic fashion.   A 1 millimeter clear-corneal paracentesis was made at the 1:30 position.  0.5 ml of preservative-free 1% lidocaine was injected into the anterior chamber.  The anterior chamber was filled with Viscoat viscoelastic.  A 2.4 millimeter keratome was used to make a near-clear corneal incision at the 10:30 position.  .  A curvilinear capsulorrhexis was made with a cystotome and capsulorrhexis forceps.  Balanced salt solution was used to hydrodissect and hydrodelineate the nucleus.   Phacoemulsification was then used in stop and chop fashion to remove the lens nucleus and epinucleus.  The remaining cortex was then removed using the irrigation and aspiration handpiece. Provisc was then placed into the capsular bag to distend it for lens placement.  A lens was then injected into the capsular bag.  The remaining viscoelastic was aspirated.   Wounds were hydrated with balanced salt  solution.  The anterior chamber was inflated to a physiologic pressure with balanced salt solution.  No wound leaks were noted. Cefuroxime 0.1 ml of a 10mg /ml solution was injected into the anterior chamber for a dose of 1 mg of intracameral antibiotic at the completion of the case.   Timolol and Brimonidine drops were applied to the eye.  The patient was taken to the recovery room in stable condition without complications of anesthesia or surgery.  Sandra Montgomery 04/26/2017, 8:35 AM

## 2017-04-26 NOTE — Anesthesia Procedure Notes (Signed)
Procedure Name: MAC Performed by: Kenyada Dosch, CRNA Pre-anesthesia Checklist: Patient identified, Emergency Drugs available, Suction available, Patient being monitored and Timeout performed Patient Re-evaluated:Patient Re-evaluated prior to induction Oxygen Delivery Method: Nasal cannula Preoxygenation: Pre-oxygenation with 100% oxygen       

## 2017-04-26 NOTE — Transfer of Care (Signed)
Immediate Anesthesia Transfer of Care Note  Patient: Sandra Montgomery  Procedure(s) Performed: CATARACT EXTRACTION PHACO AND INTRAOCULAR LENS PLACEMENT (IOC)  LEFT (Left )  Patient Location: PACU  Anesthesia Type: MAC  Level of Consciousness: awake, alert  and patient cooperative  Airway and Oxygen Therapy: Patient Spontanous Breathing and Patient connected to supplemental oxygen  Post-op Assessment: Post-op Vital signs reviewed, Patient's Cardiovascular Status Stable, Respiratory Function Stable, Patent Airway and No signs of Nausea or vomiting  Post-op Vital Signs: Reviewed and stable  Complications: No apparent anesthesia complications

## 2017-05-01 ENCOUNTER — Other Ambulatory Visit: Payer: Self-pay | Admitting: Internal Medicine

## 2017-05-11 DIAGNOSIS — H2511 Age-related nuclear cataract, right eye: Secondary | ICD-10-CM | POA: Diagnosis not present

## 2017-05-17 ENCOUNTER — Other Ambulatory Visit: Payer: Self-pay

## 2017-05-17 ENCOUNTER — Encounter: Payer: Self-pay | Admitting: *Deleted

## 2017-05-18 NOTE — Discharge Instructions (Signed)

## 2017-05-22 ENCOUNTER — Encounter: Payer: Self-pay | Admitting: Internal Medicine

## 2017-05-22 ENCOUNTER — Ambulatory Visit (INDEPENDENT_AMBULATORY_CARE_PROVIDER_SITE_OTHER): Payer: Medicare Other | Admitting: Internal Medicine

## 2017-05-22 VITALS — BP 108/70 | HR 69 | Temp 98.1°F | Resp 12 | Wt 167.0 lb

## 2017-05-22 DIAGNOSIS — J0100 Acute maxillary sinusitis, unspecified: Secondary | ICD-10-CM | POA: Diagnosis not present
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
## 2017-05-22 NOTE — Assessment & Plan Note (Signed)
Seems like typical self limited viral infection Discussed supportive Rx Should check with eye doctor about the surgery Would try empiric antibiotic if she worsens

## 2017-05-24 ENCOUNTER — Ambulatory Visit: Payer: Medicare Other | Admitting: Anesthesiology

## 2017-05-24 ENCOUNTER — Encounter
Admission: RE | Disposition: A | Discharge status: Home / Self Care | Payer: Self-pay | Source: Ambulatory Visit | Attending: Ophthalmology

## 2017-05-24 ENCOUNTER — Ambulatory Visit
Admission: RE | Admit: 2017-05-24 | Discharge: 2017-05-24 | Disposition: A | Discharge status: Home / Self Care | Payer: Medicare Other | Source: Ambulatory Visit | Attending: Ophthalmology | Admitting: Ophthalmology

## 2017-05-24 DIAGNOSIS — E7800 Pure hypercholesterolemia, unspecified: Secondary | ICD-10-CM | POA: Insufficient documentation

## 2017-05-24 DIAGNOSIS — M1990 Unspecified osteoarthritis, unspecified site: Secondary | ICD-10-CM | POA: Insufficient documentation

## 2017-05-24 DIAGNOSIS — Z7984 Long term (current) use of oral hypoglycemic drugs: Secondary | ICD-10-CM | POA: Diagnosis not present

## 2017-05-24 DIAGNOSIS — K219 Gastro-esophageal reflux disease without esophagitis: Secondary | ICD-10-CM | POA: Insufficient documentation

## 2017-05-24 DIAGNOSIS — E1136 Type 2 diabetes mellitus with diabetic cataract: Secondary | ICD-10-CM | POA: Insufficient documentation

## 2017-05-24 DIAGNOSIS — Z79890 Hormone replacement therapy (postmenopausal): Secondary | ICD-10-CM | POA: Diagnosis not present

## 2017-05-24 DIAGNOSIS — H2511 Age-related nuclear cataract, right eye: Secondary | ICD-10-CM | POA: Insufficient documentation

## 2017-05-24 DIAGNOSIS — I10 Essential (primary) hypertension: Secondary | ICD-10-CM | POA: Insufficient documentation

## 2017-05-24 DIAGNOSIS — E079 Disorder of thyroid, unspecified: Secondary | ICD-10-CM | POA: Insufficient documentation

## 2017-05-24 DIAGNOSIS — Z91040 Latex allergy status: Secondary | ICD-10-CM | POA: Diagnosis not present

## 2017-05-24 DIAGNOSIS — Z79899 Other long term (current) drug therapy: Secondary | ICD-10-CM | POA: Insufficient documentation

## 2017-05-24 DIAGNOSIS — Z9849 Cataract extraction status, unspecified eye: Secondary | ICD-10-CM | POA: Diagnosis not present

## 2017-05-24 HISTORY — PX: CATARACT EXTRACTION W/PHACO: SHX586

## 2017-05-24 LAB — GLUCOSE, CAPILLARY
GLUCOSE-CAPILLARY: 94 mg/dL (ref 65–99)
Glucose-Capillary: 115 mg/dL — ABNORMAL HIGH (ref 65–99)

## 2017-05-24 SURGERY — PHACOEMULSIFICATION, CATARACT, WITH IOL INSERTION
Anesthesia: Monitor Anesthesia Care | Laterality: Right | Wound class: Clean

## 2017-05-24 MED ORDER — FENTANYL CITRATE (PF) 100 MCG/2ML IJ SOLN
25.0000 ug | INTRAMUSCULAR | Status: DC | PRN
  Administered 2017-05-24: 50 ug via INTRAVENOUS

## 2017-05-24 MED ORDER — LACTATED RINGERS IV SOLN: INTRAVENOUS | Status: DC

## 2017-05-24 MED ORDER — OXYCODONE HCL 5 MG/5ML PO SOLN: 5.0000 mg | Freq: Once | ORAL | Status: DC | PRN

## 2017-05-24 MED ORDER — EPINEPHRINE PF 1 MG/ML IJ SOLN
INTRAOCULAR | Status: DC | PRN
  Administered 2017-05-24: 75 mL via OPHTHALMIC

## 2017-05-24 MED ORDER — ACETAMINOPHEN 325 MG PO TABS: 325.0000 mg | ORAL_TABLET | ORAL | Status: DC | PRN

## 2017-05-24 MED ORDER — ACETAMINOPHEN 325 MG PO TABS: 650.0000 mg | ORAL_TABLET | Freq: Once | ORAL | Status: DC | PRN

## 2017-05-24 MED ORDER — ACETAMINOPHEN 160 MG/5ML PO SOLN: 325.0000 mg | ORAL | Status: DC | PRN

## 2017-05-24 MED ORDER — ARMC OPHTHALMIC DILATING DROPS
1.0000 "application " | OPHTHALMIC | Status: DC | PRN
  Administered 2017-05-24 (×3): 1 via OPHTHALMIC

## 2017-05-24 MED ORDER — LIDOCAINE HCL (PF) 2 % IJ SOLN
INTRAOCULAR | Status: DC | PRN
  Administered 2017-05-24: 1 mL via INTRAMUSCULAR

## 2017-05-24 MED ORDER — OXYCODONE HCL 5 MG PO TABS: 5.0000 mg | ORAL_TABLET | Freq: Once | ORAL | Status: DC | PRN

## 2017-05-24 MED ORDER — ONDANSETRON HCL 4 MG/2ML IJ SOLN: 4.0000 mg | Freq: Once | INTRAMUSCULAR | Status: DC | PRN

## 2017-05-24 MED ORDER — BRIMONIDINE TARTRATE-TIMOLOL 0.2-0.5 % OP SOLN
OPHTHALMIC | Status: DC | PRN
  Administered 2017-05-24: 1 [drp] via OPHTHALMIC

## 2017-05-24 MED ORDER — NA HYALUR & NA CHOND-NA HYALUR 0.4-0.35 ML IO KIT
PACK | INTRAOCULAR | Status: DC | PRN
  Administered 2017-05-24: 1 mL via INTRAOCULAR

## 2017-05-24 MED ORDER — MIDAZOLAM HCL 2 MG/2ML IJ SOLN
INTRAMUSCULAR | Status: DC | PRN
  Administered 2017-05-24: 2 mg via INTRAVENOUS

## 2017-05-24 MED ORDER — MOXIFLOXACIN HCL 0.5 % OP SOLN
1.0000 [drp] | OPHTHALMIC | Status: DC | PRN
  Administered 2017-05-24 (×3): 1 [drp] via OPHTHALMIC

## 2017-05-24 MED ORDER — DEXAMETHASONE SODIUM PHOSPHATE 4 MG/ML IJ SOLN: 8.0000 mg | Freq: Once | INTRAMUSCULAR | Status: DC | PRN

## 2017-05-24 MED ORDER — CEFUROXIME OPHTHALMIC INJECTION 1 MG/0.1 ML
INJECTION | OPHTHALMIC | Status: DC | PRN
  Administered 2017-05-24: 0.1 mL via INTRACAMERAL

## 2017-05-24 SURGICAL SUPPLY — 25 items

## 2017-05-24 NOTE — Anesthesia Postprocedure Evaluation (Signed)
Anesthesia Post Note  Patient: Sandra Montgomery  Procedure(s) Performed: CATARACT EXTRACTION PHACO AND INTRAOCULAR LENS PLACEMENT (IOC) RIGHT DIABETIC (Right )  Patient location during evaluation: PACU Anesthesia Type: MAC Level of consciousness: awake and alert, oriented and patient cooperative Pain management: pain level controlled Vital Signs Assessment: post-procedure vital signs reviewed and stable Respiratory status: spontaneous breathing, nonlabored ventilation and respiratory function stable Cardiovascular status: blood pressure returned to baseline and stable Postop Assessment: adequate PO intake Anesthetic complications: no    Darrin Nipper

## 2017-05-24 NOTE — Anesthesia Procedure Notes (Signed)
Procedure Name: MAC Performed by: Kennah Hehr, CRNA Pre-anesthesia Checklist: Patient identified, Emergency Drugs available, Suction available, Timeout performed and Patient being monitored Patient Re-evaluated:Patient Re-evaluated prior to induction Oxygen Delivery Method: Nasal cannula Placement Confirmation: positive ETCO2       

## 2017-05-24 NOTE — Anesthesia Preprocedure Evaluation (Signed)
Anesthesia Evaluation  Patient identified by MRN, date of birth, ID band Patient awake    Reviewed: Allergy & Precautions, NPO status , Patient's Chart, lab work & pertinent test results  History of Anesthesia Complications Negative for: history of anesthetic complications  Airway Mallampati: III  TM Distance: >3 FB Neck ROM: Full    Dental no notable dental hx.    Pulmonary neg pulmonary ROS,    Pulmonary exam normal breath sounds clear to auscultation       Cardiovascular Exercise Tolerance: Good hypertension, Normal cardiovascular exam Rhythm:Regular Rate:Normal     Neuro/Psych negative neurological ROS     GI/Hepatic GERD  ,  Endo/Other  diabetesHypothyroidism   Renal/GU negative Renal ROS     Musculoskeletal  (+) Arthritis , Osteoarthritis,    Abdominal   Peds  Hematology negative hematology ROS (+)   Anesthesia Other Findings   Reproductive/Obstetrics                             Anesthesia Physical Anesthesia Plan  ASA: II  Anesthesia Plan: MAC   Post-op Pain Management:    Induction: Intravenous  PONV Risk Score and Plan: 2 and Midazolam and Treatment may vary due to age or medical condition  Airway Management Planned: Natural Airway  Additional Equipment:   Intra-op Plan:   Post-operative Plan:   Informed Consent: I have reviewed the patients History and Physical, chart, labs and discussed the procedure including the risks, benefits and alternatives for the proposed anesthesia with the patient or authorized representative who has indicated his/her understanding and acceptance.     Plan Discussed with: CRNA  Anesthesia Plan Comments:         Anesthesia Quick Evaluation

## 2017-05-24 NOTE — Transfer of Care (Signed)
Immediate Anesthesia Transfer of Care Note  Patient: Sandra Montgomery  Procedure(s) Performed: CATARACT EXTRACTION PHACO AND INTRAOCULAR LENS PLACEMENT (IOC) RIGHT DIABETIC (Right )  Patient Location: PACU  Anesthesia Type: MAC  Level of Consciousness: awake, alert  and patient cooperative  Airway and Oxygen Therapy: Patient Spontanous Breathing and Patient connected to supplemental oxygen  Post-op Assessment: Post-op Vital signs reviewed, Patient's Cardiovascular Status Stable, Respiratory Function Stable, Patent Airway and No signs of Nausea or vomiting  Post-op Vital Signs: Reviewed and stable  Complications: No apparent anesthesia complications

## 2017-05-24 NOTE — Op Note (Signed)
LOCATION:  [facility]   PREOPERATIVE DIAGNOSIS:    Nuclear sclerotic cataract right eye. H25.11   POSTOPERATIVE DIAGNOSIS:  Nuclear sclerotic cataract right eye.     PROCEDURE:  Phacoemusification with posterior chamber intraocular lens placement of the right eye   LENS:   Implant Name Type Inv. Item Serial No. Manufacturer Lot No. LRB No. Used  LENS IOL DIOP 21.5 - [id number] Intraocular Lens LENS IOL DIOP 21.5 [id number] AMO  Right 1        ULTRASOUND TIME: 18 % of 1 minutes, 22 seconds.  CDE 16.4   SURGEON:  Wyonia Hough, MD   ANESTHESIA:  Topical with tetracaine drops and 2% Xylocaine jelly, augmented with 1% preservative-free intracameral lidocaine.    COMPLICATIONS:  None.   DESCRIPTION OF PROCEDURE:  The patient was identified in the holding room and transported to the operating room and placed in the supine position under the operating microscope.  The right eye was identified as the operative eye and it was prepped and draped in the usual sterile ophthalmic fashion.   A 1 millimeter clear-corneal paracentesis was made at the 12:00 position.  0.5 ml of preservative-free 1% lidocaine was injected into the anterior chamber. The anterior chamber was filled with Viscoat viscoelastic.  A 2.4 millimeter keratome was used to make a near-clear corneal incision at the 9:00 position.  A curvilinear capsulorrhexis was made with a cystotome and capsulorrhexis forceps.  Balanced salt solution was used to hydrodissect and hydrodelineate the nucleus.   Phacoemulsification was then used in stop and chop fashion to remove the lens nucleus and epinucleus.  The remaining cortex was then removed using the irrigation and aspiration handpiece. Provisc was then placed into the capsular bag to distend it for lens placement.  A lens was then injected into the capsular bag.  The remaining viscoelastic was aspirated.   Wounds were hydrated with balanced salt solution.  The anterior  chamber was inflated to a physiologic pressure with balanced salt solution.  No wound leaks were noted. Cefuroxime 0.1 ml of a 10mg /ml solution was injected into the anterior chamber for a dose of 1 mg of intracameral antibiotic at the completion of the case.   Timolol and Brimonidine drops were applied to the eye.  The patient was taken to the recovery room in stable condition without complications of anesthesia or surgery.   Tyrese Capriotti 05/24/2017, 8:35 AM

## 2017-05-24 NOTE — H&P (Signed)
The History and Physical notes are on paper, have been signed, and are to be scanned. The patient remains stable and unchanged from the H&P.   Previous H&P reviewed, patient examined, and there are no changes.  Sandra Montgomery 05/24/2017 7:41 AM

## 2017-05-29 ENCOUNTER — Telehealth: Payer: Self-pay | Admitting: Internal Medicine

## 2017-05-29 MED ORDER — AMOXICILLIN 500 MG PO TABS: 1000.0000 mg | ORAL_TABLET | Freq: Two times a day (BID) | ORAL | 0 refills | Status: AC

## 2017-05-29 NOTE — Telephone Encounter (Signed)
Let her know that I sent the prescription. She should let us know if she isn't improving towards the end of the week

## 2017-05-29 NOTE — Telephone Encounter (Signed)
Copied from [id number] [id number]. Topic: Quick Communication - See Telephone Encounter >> May 29, 2017  9:41 AM Ahmed Prima L wrote: CRM for notification. See Telephone encounter for:   05/29/17.  Pt states that she was in the office on Monday of last week for a viral infection. Dr Silvio Pate told her to take Robitussin & she has already taken 2 bottles. She said she feels worse than she did. She stated that Dr Silvio Pate told her to call today if she did not feel anybody & he would send over a prescription for her. Symptoms are coughing, head stopped up, runny nose & congestion.   [facility] [place], [place]

## 2017-05-29 NOTE — Telephone Encounter (Signed)
Left detailed message per DPR  

## 2017-11-02 ENCOUNTER — Ambulatory Visit: Payer: Medicare Other | Admitting: Internal Medicine

## 2017-11-02 DIAGNOSIS — L255 Unspecified contact dermatitis due to plants, except food: Secondary | ICD-10-CM | POA: Diagnosis not present

## 2017-12-28 DIAGNOSIS — H40003 Preglaucoma, unspecified, bilateral: Secondary | ICD-10-CM | POA: Diagnosis not present

## 2017-12-28 LAB — HM DIABETES EYE EXAM

## 2018-01-04 ENCOUNTER — Encounter: Payer: Self-pay | Admitting: Internal Medicine

## 2018-03-17 DIAGNOSIS — J069 Acute upper respiratory infection, unspecified: Secondary | ICD-10-CM | POA: Diagnosis not present

## 2018-03-17 DIAGNOSIS — J209 Acute bronchitis, unspecified: Secondary | ICD-10-CM | POA: Diagnosis not present

## 2018-03-17 DIAGNOSIS — R0982 Postnasal drip: Secondary | ICD-10-CM | POA: Diagnosis not present

## 2018-03-25 DIAGNOSIS — J209 Acute bronchitis, unspecified: Secondary | ICD-10-CM | POA: Diagnosis not present

## 2018-03-25 DIAGNOSIS — R05 Cough: Secondary | ICD-10-CM | POA: Diagnosis not present

## 2018-03-26 ENCOUNTER — Encounter: Payer: Medicare Other | Admitting: Internal Medicine

## 2018-03-29 ENCOUNTER — Encounter: Payer: Self-pay | Admitting: Internal Medicine

## 2018-03-29 ENCOUNTER — Ambulatory Visit (INDEPENDENT_AMBULATORY_CARE_PROVIDER_SITE_OTHER): Payer: Medicare Other | Admitting: Internal Medicine

## 2018-03-29 VITALS — BP 118/70 | HR 81 | Temp 98.0°F | Ht 64.0 in | Wt 182.0 lb

## 2018-03-29 DIAGNOSIS — Z7189 Other specified counseling: Secondary | ICD-10-CM | POA: Diagnosis not present

## 2018-03-29 DIAGNOSIS — Z1231 Encounter for screening mammogram for malignant neoplasm of breast: Secondary | ICD-10-CM

## 2018-03-29 DIAGNOSIS — N183 Chronic kidney disease, stage 3 unspecified: Secondary | ICD-10-CM

## 2018-03-29 DIAGNOSIS — I10 Essential (primary) hypertension: Secondary | ICD-10-CM | POA: Diagnosis not present

## 2018-03-29 DIAGNOSIS — E785 Hyperlipidemia, unspecified: Secondary | ICD-10-CM | POA: Diagnosis not present

## 2018-03-29 DIAGNOSIS — N1831 Chronic kidney disease, stage 3a: Secondary | ICD-10-CM | POA: Insufficient documentation

## 2018-03-29 DIAGNOSIS — Z0000 Encounter for general adult medical examination without abnormal findings: Secondary | ICD-10-CM

## 2018-03-29 DIAGNOSIS — R7303 Prediabetes: Secondary | ICD-10-CM | POA: Diagnosis not present

## 2018-03-29 DIAGNOSIS — E039 Hypothyroidism, unspecified: Secondary | ICD-10-CM

## 2018-03-29 DIAGNOSIS — K219 Gastro-esophageal reflux disease without esophagitis: Secondary | ICD-10-CM | POA: Diagnosis not present

## 2018-03-29 DIAGNOSIS — N1832 Chronic kidney disease, stage 3b: Secondary | ICD-10-CM | POA: Insufficient documentation

## 2018-03-29 MED ORDER — OMEPRAZOLE 40 MG PO CPDR: 40.0000 mg | DELAYED_RELEASE_CAPSULE | Freq: Every day | ORAL | 3 refills | Status: DC

## 2018-03-29 MED ORDER — HYDROCHLOROTHIAZIDE 25 MG PO TABS: 25.0000 mg | ORAL_TABLET | Freq: Every day | ORAL | 3 refills | Status: DC

## 2018-03-29 MED ORDER — METFORMIN HCL 500 MG PO TABS: 500.0000 mg | ORAL_TABLET | Freq: Two times a day (BID) | ORAL | 3 refills | Status: DC

## 2018-03-29 MED ORDER — ATORVASTATIN CALCIUM 80 MG PO TABS: ORAL_TABLET | ORAL | 3 refills | Status: DC

## 2018-03-29 MED ORDER — VALSARTAN 320 MG PO TABS: 320.0000 mg | ORAL_TABLET | Freq: Every day | ORAL | 3 refills | Status: DC

## 2018-03-29 MED ORDER — LEVOTHYROXINE SODIUM 75 MCG PO TABS: 75.0000 ug | ORAL_TABLET | Freq: Every day | ORAL | 3 refills | Status: DC

## 2018-03-29 NOTE — Patient Instructions (Signed)
Please check with the pharmacist about the shingrix vaccine.   DASH Eating Plan DASH stands for "Dietary Approaches to Stop Hypertension." The DASH eating plan is a healthy eating plan that has been shown to reduce high blood pressure (hypertension). It may also reduce your risk for type 2 diabetes, heart disease, and stroke. The DASH eating plan may also help with weight loss. What are tips for following this plan?  General guidelines  Avoid eating more than 2,300 mg (milligrams) of salt (sodium) a day. If you have hypertension, you may need to reduce your sodium intake to 1,500 mg a day.  Limit alcohol intake to no more than 1 drink a day for nonpregnant women and 2 drinks a day for men. One drink equals 12 oz of beer, 5 oz of wine, or 1 oz of hard liquor.  Work with your health care provider to maintain a healthy body weight or to lose weight. Ask what an ideal weight is for you.  Get at least 30 minutes of exercise that causes your heart to beat faster (aerobic exercise) most days of the week. Activities may include walking, swimming, or biking.  Work with your health care provider or diet and nutrition specialist (dietitian) to adjust your eating plan to your individual calorie needs. Reading food labels   Check food labels for the amount of sodium per serving. Choose foods with less than 5 percent of the Daily Value of sodium. Generally, foods with less than 300 mg of sodium per serving fit into this eating plan.  To find whole grains, look for the word "whole" as the first word in the ingredient list. Shopping  Buy products labeled as "low-sodium" or "no salt added."  Buy fresh foods. Avoid canned foods and premade or frozen meals. Cooking  Avoid adding salt when cooking. Use salt-free seasonings or herbs instead of table salt or sea salt. Check with your health care provider or pharmacist before using salt substitutes.  Do not fry foods. Cook foods using healthy methods such  as baking, boiling, grilling, and broiling instead.  Cook with heart-healthy oils, such as olive, canola, soybean, or sunflower oil. Meal planning  Eat a balanced diet that includes: ? 5 or more servings of fruits and vegetables each day. At each meal, try to fill half of your plate with fruits and vegetables. ? Up to 6-8 servings of whole grains each day. ? Less than 6 oz of lean meat, poultry, or fish each day. A 3-oz serving of meat is about the same size as a deck of cards. One egg equals 1 oz. ? 2 servings of low-fat dairy each day. ? A serving of nuts, seeds, or beans 5 times each week. ? Heart-healthy fats. Healthy fats called Omega-3 fatty acids are found in foods such as flaxseeds and coldwater fish, like sardines, salmon, and mackerel.  Limit how much you eat of the following: ? Canned or prepackaged foods. ? Food that is high in trans fat, such as fried foods. ? Food that is high in saturated fat, such as fatty meat. ? Sweets, desserts, sugary drinks, and other foods with added sugar. ? Full-fat dairy products.  Do not salt foods before eating.  Try to eat at least 2 vegetarian meals each week.  Eat more home-cooked food and less restaurant, buffet, and fast food.  When eating at a restaurant, ask that your food be prepared with less salt or no salt, if possible. What foods are recommended? The items listed  may not be a complete list. Talk with your dietitian about what dietary choices are best for you. Grains Whole-grain or whole-wheat bread. Whole-grain or whole-wheat pasta. Brown rice. [place]ena Morrow. Bulgur. Whole-grain and low-sodium cereals. Pita bread. Low-fat, low-sodium crackers. Whole-wheat flour tortillas. Vegetables Fresh or frozen vegetables (raw, steamed, roasted, or grilled). Low-sodium or reduced-sodium tomato and vegetable juice. Low-sodium or reduced-sodium tomato sauce and tomato paste. Low-sodium or reduced-sodium canned vegetables. Fruits All fresh,  dried, or frozen fruit. Canned fruit in natural juice (without added sugar). Meat and other protein foods Skinless chicken or [place]. Ground chicken or [place]. Pork with fat trimmed off. Fish and seafood. Egg whites. Dried beans, peas, or lentils. Unsalted nuts, nut butters, and seeds. Unsalted canned beans. Lean cuts of beef with fat trimmed off. Low-sodium, lean deli meat. Dairy Low-fat (1%) or fat-free (skim) milk. Fat-free, low-fat, or reduced-fat cheeses. Nonfat, low-sodium ricotta or cottage cheese. Low-fat or nonfat yogurt. Low-fat, low-sodium cheese. Fats and oils Soft margarine without trans fats. Vegetable oil. Low-fat, reduced-fat, or light mayonnaise and salad dressings (reduced-sodium). Canola, safflower, olive, soybean, and sunflower oils. Avocado. Seasoning and other foods Herbs. Spices. Seasoning mixes without salt. Unsalted popcorn and pretzels. Fat-free sweets. What foods are not recommended? The items listed may not be a complete list. Talk with your dietitian about what dietary choices are best for you. Grains Baked goods made with fat, such as croissants, muffins, or some breads. Dry pasta or rice meal packs. Vegetables Creamed or fried vegetables. Vegetables in a cheese sauce. Regular canned vegetables (not low-sodium or reduced-sodium). Regular canned tomato sauce and paste (not low-sodium or reduced-sodium). Regular tomato and vegetable juice (not low-sodium or reduced-sodium). Angie Fava. Olives. Fruits Canned fruit in a light or heavy syrup. Fried fruit. Fruit in cream or butter sauce. Meat and other protein foods Fatty cuts of meat. Ribs. Fried meat. Berniece Salines. Sausage. Bologna and other processed lunch meats. Salami. Fatback. Hotdogs. Bratwurst. Salted nuts and seeds. Canned beans with added salt. Canned or smoked fish. Whole eggs or egg yolks. Chicken or [place] with skin. Dairy Whole or 2% milk, cream, and half-and-half. Whole or full-fat cream cheese. Whole-fat or sweetened  yogurt. Full-fat cheese. Nondairy creamers. Whipped toppings. Processed cheese and cheese spreads. Fats and oils Butter. Stick margarine. Lard. Shortening. Ghee. Bacon fat. Tropical oils, such as coconut, palm kernel, or palm oil. Seasoning and other foods Salted popcorn and pretzels. Onion salt, garlic salt, seasoned salt, table salt, and sea salt. Worcestershire sauce. Tartar sauce. Barbecue sauce. Teriyaki sauce. Soy sauce, including reduced-sodium. Steak sauce. Canned and packaged gravies. Fish sauce. Oyster sauce. Cocktail sauce. Horseradish that you find on the shelf. Ketchup. Mustard. Meat flavorings and tenderizers. Bouillon cubes. Hot sauce and Tabasco sauce. Premade or packaged marinades. Premade or packaged taco seasonings. Relishes. Regular salad dressings. Where to find more information:  National Heart, Lung, and [place]: https://wilson-eaton.com/  American Heart Association: www.heart.org Summary  The DASH eating plan is a healthy eating plan that has been shown to reduce high blood pressure (hypertension). It may also reduce your risk for type 2 diabetes, heart disease, and stroke.  With the DASH eating plan, you should limit salt (sodium) intake to 2,300 mg a day. If you have hypertension, you may need to reduce your sodium intake to 1,500 mg a day.  When on the DASH eating plan, aim to eat more fresh fruits and vegetables, whole grains, lean proteins, low-fat dairy, and heart-healthy fats.  Work with your health care provider or diet  and nutrition specialist (dietitian) to adjust your eating plan to your individual calorie needs. This information is not intended to replace advice given to you by your health care provider. Make sure you discuss any questions you have with your health care provider. Document Released: 03/10/2011 Document Revised: 03/14/2016 Document Reviewed: 03/14/2016 Elsevier Interactive Patient Education  2019 [employer].

## 2018-03-29 NOTE — Assessment & Plan Note (Signed)
I have personally reviewed the Medicare Annual Wellness questionnaire and have noted 1. The patient's medical and social history 2. Their use of alcohol, tobacco or illicit drugs 3. Their current medications and supplements 4. The patient's functional ability including ADL's, fall risks, home safety risks and hearing or visual             impairment. 5. Diet and physical activities 6. Evidence for depression or mood disorders  The patients weight, height, BMI and visual acuity have been recorded in the chart I have made referrals, counseling and provided education to the patient based review of the above and I have provided the pt with a written personalized care plan for preventive services.  I have provided you with a copy of your personalized plan for preventive services. Please take the time to review along with your updated medication list.  Done with colon---done last year Will set up mammogram Consider shingrix Yearly flu vaccine Discussed fitness

## 2018-03-29 NOTE — Assessment & Plan Note (Signed)
See social history 

## 2018-03-29 NOTE — Assessment & Plan Note (Signed)
No problems with the statin

## 2018-03-29 NOTE — Assessment & Plan Note (Signed)
Chronic mild CKD 3 On ARB

## 2018-03-29 NOTE — Assessment & Plan Note (Signed)
BP Readings from Last 3 Encounters:  03/29/18 118/70  05/24/17 115/64  05/22/17 108/70   Good control Due for labs

## 2018-03-29 NOTE — Addendum Note (Signed)
Addended by: Pilar Grammes on: 03/29/2018 04:24 PM   Modules accepted: Orders
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
## 2018-03-29 NOTE — Assessment & Plan Note (Signed)
Seems to be euthyroid 

## 2018-03-29 NOTE — Progress Notes (Signed)
Hearing Screening   Method: Audiometry   125Hz 250Hz 500Hz 1000Hz 2000Hz 3000Hz 4000Hz 6000Hz 8000Hz  Right ear:   20 20 20  20    Left ear:   20 20 20  20    Vision Screening Comments: September 2019   

## 2018-03-29 NOTE — Assessment & Plan Note (Signed)
Will check labs (non fasting today) Restart the metformin

## 2018-03-29 NOTE — Assessment & Plan Note (Signed)
Doing okay on the PPI Didn't tolerate skipping doses

## 2018-03-30 ENCOUNTER — Telehealth: Payer: Self-pay

## 2018-03-30 LAB — LIPID PANEL
CHOLESTEROL: 155 mg/dL (ref 0–200)
HDL: 53.4 mg/dL (ref >39.00)
LDL Cholesterol: 63 mg/dL (ref 0–99)
NonHDL: 101.53
Total CHOL/HDL Ratio: 3
Triglycerides: 191 mg/dL — ABNORMAL HIGH (ref 0.0–149.0)
VLDL: 38.2 mg/dL (ref 0.0–40.0)

## 2018-03-30 LAB — CBC
HCT: 34.8 % — ABNORMAL LOW (ref 36.0–46.0)
Hemoglobin: 11.3 g/dL — ABNORMAL LOW (ref 12.0–15.0)
MCHC: 32.6 g/dL (ref 30.0–36.0)
MCV: 84.7 fl (ref 78.0–100.0)
Platelets: 259 10*3/uL (ref 150.0–400.0)
RBC: 4.1 Mil/uL (ref 3.87–5.11)
RDW: 14.9 % (ref 11.5–15.5)
WBC: 9.6 10*3/uL (ref 4.0–10.5)

## 2018-03-30 LAB — COMPREHENSIVE METABOLIC PANEL
ALBUMIN: 4.2 g/dL (ref 3.5–5.2)
ALT: 15 U/L (ref 0–35)
AST: 15 U/L (ref 0–37)
Alkaline Phosphatase: 68 U/L (ref 39–117)
BILIRUBIN TOTAL: 0.4 mg/dL (ref 0.2–1.2)
BUN: 34 mg/dL — ABNORMAL HIGH (ref 6–23)
CO2: 27 mEq/L (ref 19–32)
Calcium: 9.4 mg/dL (ref 8.4–10.5)
Chloride: 105 mEq/L (ref 96–112)
Creatinine, Ser: 1.66 mg/dL — ABNORMAL HIGH (ref 0.40–1.20)
GFR: 31.92 mL/min — ABNORMAL LOW (ref >60.00)
Glucose, Bld: 125 mg/dL — ABNORMAL HIGH (ref 70–99)
Potassium: 4.8 mEq/L (ref 3.5–5.1)
Sodium: 139 mEq/L (ref 135–145)
Total Protein: 6.8 g/dL (ref 6.0–8.3)

## 2018-03-30 LAB — HEMOGLOBIN A1C: HEMOGLOBIN A1C: 7.1 % — AB (ref 4.6–6.5)

## 2018-03-30 LAB — TSH: TSH: 1.71 u[IU]/mL (ref 0.35–4.50)

## 2018-03-30 LAB — T4, FREE: FREE T4: 2.36 ng/dL — AB (ref 0.60–1.60)

## 2018-03-30 NOTE — Telephone Encounter (Signed)
Left message for pt to talk to her about her lab results.

## 2018-04-02 NOTE — Telephone Encounter (Signed)
Pt returned call to [facility] for results. Pt is at work until 61, so it is easier to reach her on her work phone [phone]

## 2018-04-03 NOTE — Telephone Encounter (Signed)
Spoke to pt. See labs 

## 2018-05-03 ENCOUNTER — Ambulatory Visit (INDEPENDENT_AMBULATORY_CARE_PROVIDER_SITE_OTHER): Payer: Medicare Other | Admitting: Internal Medicine

## 2018-05-03 ENCOUNTER — Encounter: Payer: Self-pay | Admitting: Internal Medicine

## 2018-05-03 VITALS — BP 120/74 | HR 76 | Temp 97.8°F | Ht 64.0 in | Wt 179.0 lb

## 2018-05-03 DIAGNOSIS — N183 Chronic kidney disease, stage 3 unspecified: Secondary | ICD-10-CM

## 2018-05-03 DIAGNOSIS — I10 Essential (primary) hypertension: Secondary | ICD-10-CM | POA: Diagnosis not present

## 2018-05-03 DIAGNOSIS — L570 Actinic keratosis: Secondary | ICD-10-CM | POA: Diagnosis not present
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
## 2018-05-03 NOTE — Assessment & Plan Note (Signed)
BP Readings from Last 3 Encounters:  05/03/18 120/74  03/29/18 118/70  05/24/17 115/64   Fine off the HCTZ

## 2018-05-03 NOTE — Assessment & Plan Note (Signed)
Discussed options Verbal consent Liquid nitrogen 40 seconds x 2 Tolerated well Discussed home care High risk this could be early cancer---if any recurrence, she needs to go to derm

## 2018-05-03 NOTE — Assessment & Plan Note (Signed)
Had worsened so HCTZ stopped Will recheck ----to [facility] for evaluation if no better

## 2018-05-04 LAB — RENAL FUNCTION PANEL
Albumin: 4.4 g/dL (ref 3.5–5.2)
BUN: 21 mg/dL (ref 6–23)
CO2: 28 mEq/L (ref 19–32)
Calcium: 9.8 mg/dL (ref 8.4–10.5)
Chloride: 100 mEq/L (ref 96–112)
Creatinine, Ser: 1.24 mg/dL — ABNORMAL HIGH (ref 0.40–1.20)
GFR: 42.05 mL/min — ABNORMAL LOW (ref >60.00)
Glucose, Bld: 88 mg/dL (ref 70–99)
Phosphorus: 3.2 mg/dL (ref 2.3–4.6)
Potassium: 4.6 mEq/L (ref 3.5–5.1)
Sodium: 136 mEq/L (ref 135–145)

## 2018-05-08 ENCOUNTER — Telehealth: Payer: Self-pay | Admitting: *Deleted

## 2018-05-08 NOTE — Telephone Encounter (Signed)
I would have to call [facility] and hope they would be able to tell me.

## 2018-05-08 NOTE — Telephone Encounter (Signed)
I am okay with candasartan 32, irbesartan 300 or olmesartan 40mg  Any way to know which of these is in good supply? Send for a year

## 2018-05-08 NOTE — Telephone Encounter (Signed)
Spoke to pt who states she was advised by [facility] that Diovan is on back order and they are unsure when it will be in stock. She has enough to last for an additional 2wks but is requesting a Rx for a new BP medication be sent to [facility]. pls advise

## 2018-05-09 MED ORDER — OLMESARTAN MEDOXOMIL 40 MG PO TABS: 40.0000 mg | ORAL_TABLET | Freq: Every day | ORAL | 3 refills | Status: DC

## 2018-05-09 NOTE — Telephone Encounter (Signed)
I asked around the office and people have said olmesartan has been available for them. I sent in an rx for that.

## 2018-05-09 NOTE — Addendum Note (Signed)
Addended by: Pilar Grammes on: 05/09/2018 10:01 AM   Modules accepted: Orders

## 2018-07-07 ENCOUNTER — Other Ambulatory Visit: Payer: Self-pay | Admitting: Internal Medicine

## 2018-11-15 DIAGNOSIS — H40003 Preglaucoma, unspecified, bilateral: Secondary | ICD-10-CM | POA: Diagnosis not present

## 2018-11-16 ENCOUNTER — Encounter: Payer: Self-pay | Admitting: Internal Medicine

## 2018-11-16 DIAGNOSIS — H40003 Preglaucoma, unspecified, bilateral: Secondary | ICD-10-CM | POA: Diagnosis not present

## 2018-11-16 LAB — HM DIABETES EYE EXAM

## 2018-11-25 ENCOUNTER — Emergency Department: Payer: Medicare Other

## 2018-11-25 ENCOUNTER — Other Ambulatory Visit: Payer: Self-pay

## 2018-11-25 ENCOUNTER — Observation Stay
Admission: EM | Admit: 2018-11-25 | Discharge: 2018-11-27 | Disposition: A | Discharge status: Home / Self Care | Payer: Medicare Other | Attending: Internal Medicine | Admitting: Internal Medicine

## 2018-11-25 ENCOUNTER — Encounter: Payer: Self-pay | Admitting: Emergency Medicine

## 2018-11-25 DIAGNOSIS — M25521 Pain in right elbow: Secondary | ICD-10-CM | POA: Diagnosis not present

## 2018-11-25 DIAGNOSIS — Z79890 Hormone replacement therapy (postmenopausal): Secondary | ICD-10-CM | POA: Diagnosis not present

## 2018-11-25 DIAGNOSIS — Z79899 Other long term (current) drug therapy: Secondary | ICD-10-CM | POA: Insufficient documentation

## 2018-11-25 DIAGNOSIS — I629 Nontraumatic intracranial hemorrhage, unspecified: Secondary | ICD-10-CM | POA: Diagnosis not present

## 2018-11-25 DIAGNOSIS — R55 Syncope and collapse: Secondary | ICD-10-CM

## 2018-11-25 DIAGNOSIS — W010XXA Fall on same level from slipping, tripping and stumbling without subsequent striking against object, initial encounter: Secondary | ICD-10-CM | POA: Insufficient documentation

## 2018-11-25 DIAGNOSIS — Z888 Allergy status to other drugs, medicaments and biological substances status: Secondary | ICD-10-CM | POA: Diagnosis not present

## 2018-11-25 DIAGNOSIS — E039 Hypothyroidism, unspecified: Secondary | ICD-10-CM | POA: Diagnosis not present

## 2018-11-25 DIAGNOSIS — Z85828 Personal history of other malignant neoplasm of skin: Secondary | ICD-10-CM | POA: Diagnosis not present

## 2018-11-25 DIAGNOSIS — R51 Headache: Secondary | ICD-10-CM | POA: Diagnosis not present

## 2018-11-25 DIAGNOSIS — R0781 Pleurodynia: Secondary | ICD-10-CM | POA: Diagnosis not present

## 2018-11-25 DIAGNOSIS — S01111A Laceration without foreign body of right eyelid and periocular area, initial encounter: Secondary | ICD-10-CM | POA: Insufficient documentation

## 2018-11-25 DIAGNOSIS — S06300A Unspecified focal traumatic brain injury without loss of consciousness, initial encounter: Secondary | ICD-10-CM | POA: Diagnosis not present

## 2018-11-25 DIAGNOSIS — Z8349 Family history of other endocrine, nutritional and metabolic diseases: Secondary | ICD-10-CM | POA: Diagnosis not present

## 2018-11-25 DIAGNOSIS — E1122 Type 2 diabetes mellitus with diabetic chronic kidney disease: Secondary | ICD-10-CM | POA: Insufficient documentation

## 2018-11-25 DIAGNOSIS — R52 Pain, unspecified: Secondary | ICD-10-CM

## 2018-11-25 DIAGNOSIS — I129 Hypertensive chronic kidney disease with stage 1 through stage 4 chronic kidney disease, or unspecified chronic kidney disease: Secondary | ICD-10-CM | POA: Diagnosis not present

## 2018-11-25 DIAGNOSIS — S299XXA Unspecified injury of thorax, initial encounter: Secondary | ICD-10-CM | POA: Diagnosis not present

## 2018-11-25 DIAGNOSIS — S0231XA Fracture of orbital floor, right side, initial encounter for closed fracture: Secondary | ICD-10-CM | POA: Insufficient documentation

## 2018-11-25 DIAGNOSIS — N183 Chronic kidney disease, stage 3 (moderate): Secondary | ICD-10-CM | POA: Diagnosis not present

## 2018-11-25 DIAGNOSIS — M810 Age-related osteoporosis without current pathological fracture: Secondary | ICD-10-CM | POA: Insufficient documentation

## 2018-11-25 DIAGNOSIS — S0990XA Unspecified injury of head, initial encounter: Secondary | ICD-10-CM

## 2018-11-25 DIAGNOSIS — Z7984 Long term (current) use of oral hypoglycemic drugs: Secondary | ICD-10-CM | POA: Insufficient documentation

## 2018-11-25 DIAGNOSIS — S199XXA Unspecified injury of neck, initial encounter: Secondary | ICD-10-CM | POA: Diagnosis not present

## 2018-11-25 DIAGNOSIS — K219 Gastro-esophageal reflux disease without esophagitis: Secondary | ICD-10-CM | POA: Diagnosis not present

## 2018-11-25 DIAGNOSIS — Z20828 Contact with and (suspected) exposure to other viral communicable diseases: Secondary | ICD-10-CM | POA: Insufficient documentation

## 2018-11-25 DIAGNOSIS — E785 Hyperlipidemia, unspecified: Secondary | ICD-10-CM | POA: Insufficient documentation

## 2018-11-25 DIAGNOSIS — S0511XA Contusion of eyeball and orbital tissues, right eye, initial encounter: Secondary | ICD-10-CM | POA: Diagnosis not present

## 2018-11-25 DIAGNOSIS — S06301A Unspecified focal traumatic brain injury with loss of consciousness of 30 minutes or less, initial encounter: Secondary | ICD-10-CM

## 2018-11-25 DIAGNOSIS — S062X1A Diffuse traumatic brain injury with loss of consciousness of 30 minutes or less, initial encounter: Secondary | ICD-10-CM | POA: Diagnosis not present

## 2018-11-25 DIAGNOSIS — Z8249 Family history of ischemic heart disease and other diseases of the circulatory system: Secondary | ICD-10-CM | POA: Insufficient documentation

## 2018-11-25 DIAGNOSIS — M1711 Unilateral primary osteoarthritis, right knee: Secondary | ICD-10-CM | POA: Diagnosis not present

## 2018-11-25 DIAGNOSIS — D649 Anemia, unspecified: Secondary | ICD-10-CM | POA: Diagnosis not present

## 2018-11-25 DIAGNOSIS — I619 Nontraumatic intracerebral hemorrhage, unspecified: Secondary | ICD-10-CM | POA: Diagnosis present

## 2018-11-25 DIAGNOSIS — S8991XA Unspecified injury of right lower leg, initial encounter: Secondary | ICD-10-CM | POA: Diagnosis not present

## 2018-11-25 LAB — CBC WITH DIFFERENTIAL/PLATELET
Abs Immature Granulocytes: 0.06 10*3/uL (ref 0.00–0.07)
Basophils Absolute: 0.1 10*3/uL (ref 0.0–0.1)
Basophils Relative: 1 %
Eosinophils Absolute: 0.3 10*3/uL (ref 0.0–0.5)
Eosinophils Relative: 2 %
HCT: 29.2 % — ABNORMAL LOW (ref 36.0–46.0)
Hemoglobin: 8.9 g/dL — ABNORMAL LOW (ref 12.0–15.0)
Immature Granulocytes: 1 %
Lymphocytes Relative: 14 %
Lymphs Abs: 1.5 10*3/uL (ref 0.7–4.0)
MCH: 25.5 pg — ABNORMAL LOW (ref 26.0–34.0)
MCHC: 30.5 g/dL (ref 30.0–36.0)
MCV: 83.7 fL (ref 80.0–100.0)
Monocytes Absolute: 0.8 10*3/uL (ref 0.1–1.0)
Monocytes Relative: 8 %
Neutro Abs: 7.7 10*3/uL (ref 1.7–7.7)
Neutrophils Relative %: 74 %
Platelets: 372 10*3/uL (ref 150–400)
RBC: 3.49 MIL/uL — ABNORMAL LOW (ref 3.87–5.11)
RDW: 14.4 % (ref 11.5–15.5)
WBC: 10.4 10*3/uL (ref 4.0–10.5)
nRBC: 0 % (ref 0.0–0.2)

## 2018-11-25 LAB — URINALYSIS, COMPLETE (UACMP) WITH MICROSCOPIC
Bacteria, UA: NONE SEEN
Bilirubin Urine: NEGATIVE
Glucose, UA: NEGATIVE mg/dL
Hgb urine dipstick: NEGATIVE
Ketones, ur: NEGATIVE mg/dL
Nitrite: NEGATIVE
Protein, ur: 30 mg/dL — AB
Specific Gravity, Urine: 1.021 (ref 1.005–1.030)
pH: 5 (ref 5.0–8.0)

## 2018-11-25 LAB — TROPONIN I (HIGH SENSITIVITY): Troponin I (High Sensitivity): 6 ng/L (ref <18)

## 2018-11-25 LAB — BASIC METABOLIC PANEL
Anion gap: 11 (ref 5–15)
BUN: 25 mg/dL — ABNORMAL HIGH (ref 8–23)
CO2: 25 mmol/L (ref 22–32)
Calcium: 9.7 mg/dL (ref 8.9–10.3)
Chloride: 101 mmol/L (ref 98–111)
Creatinine, Ser: 1.11 mg/dL — ABNORMAL HIGH (ref 0.44–1.00)
GFR calc Af Amer: 56 mL/min — ABNORMAL LOW (ref >60)
GFR calc non Af Amer: 48 mL/min — ABNORMAL LOW (ref >60)
Glucose, Bld: 111 mg/dL — ABNORMAL HIGH (ref 70–99)
Potassium: 3.9 mmol/L (ref 3.5–5.1)
Sodium: 137 mmol/L (ref 135–145)

## 2018-11-25 MED ORDER — ACETAMINOPHEN 500 MG PO TABS
1000.0000 mg | ORAL_TABLET | Freq: Once | ORAL | Status: AC
  Administered 2018-11-25: 1000 mg via ORAL

## 2018-11-25 MED ORDER — ONDANSETRON HCL 4 MG/2ML IJ SOLN
4.0000 mg | Freq: Once | INTRAMUSCULAR | Status: AC
  Administered 2018-11-25: 4 mg via INTRAVENOUS
  Filled 2018-11-25: qty 2

## 2018-11-25 MED ORDER — ACETAMINOPHEN 500 MG PO TABS
ORAL_TABLET | ORAL | Status: AC
  Filled 2018-11-25: qty 2

## 2018-11-25 NOTE — ED Notes (Signed)
Pt states headache improved. Lights dimmed for comfort. Plan of care discussed with family and pt who verbalize understanding.

## 2018-11-25 NOTE — ED Provider Notes (Signed)
[facility] Emergency Department Provider Note ____________________________________________   First MD Initiated Contact with Patient 11/25/18 2045     (approximate)  I have reviewed the triage vital signs and the nursing notes.   HISTORY  Chief Complaint Fall and Laceration    HPI Sandra Montgomery is a 78 y.o. female with PMH as noted below who presents with a fall and head injury, acute onset this evening when the patient states that she was at a restaurant and caught her shoe on the corner of a booth.  The patient denies losing consciousness.  She states that she has a headache.  She denies nausea or vomiting.  She reports associated right rib and elbow pain as well as pain to the right knee.  She also reports that she has been having dizzy spells intermittently in the last few weeks which she describes as a vertigo or spinning type sensation, although she is not having it now.  Past Medical History:  Diagnosis Date   Allergic rhinitis, cause unspecified    Cancer ([place])    shoulder carcinoma   Diabetes mellitus without complication ([place])    GERD (gastroesophageal reflux disease)    History of blood transfusion    Hyperlipidemia    Hypertension    Impaired fasting glucose    Osteoarthrosis involving, or with mention of more than one site, but not specified as generalized, multiple sites    Osteoporosis    Pneumothorax 1965   prolonged bed rest    Unspecified hypothyroidism     Patient Active Problem List   Diagnosis Date Noted   Actinic keratosis 05/03/2018   Chronic kidney disease, stage III (moderate) ([place]) 03/29/2018   Advanced directives, counseling/discussion 02/06/2014   Routine general medical examination at a health care facility 07/27/2012   Osteoporosis    Hypertension    Hyperlipidemia    Allergic rhinitis, cause unspecified    Hypothyroidism    Prediabetes    Osteoarthritis, multiple sites    GERD  (gastroesophageal reflux disease)     Past Surgical History:  Procedure Laterality Date   ABDOMINAL HYSTERECTOMY  1981   BREAST EXCISIONAL BIOPSY Left years ago   x 2. Negative.    BREAST SURGERY  1985   non cancerous mass removed   CATARACT EXTRACTION W/PHACO Left 04/26/2017   Procedure: CATARACT EXTRACTION PHACO AND INTRAOCULAR LENS PLACEMENT ([place])  LEFT;  Surgeon: Leandrew Koyanagi, MD;  Location: [facility];  Service: Ophthalmology;  Laterality: Left;  Diabetic - oral meds   CATARACT EXTRACTION W/PHACO Right 05/24/2017   Procedure: CATARACT EXTRACTION PHACO AND INTRAOCULAR LENS PLACEMENT ([place]) RIGHT DIABETIC;  Surgeon: Leandrew Koyanagi, MD;  Location: [facility];  Service: Ophthalmology;  Laterality: Right;  Diabetic - oral meds   COLONOSCOPY     COLONOSCOPY WITH PROPOFOL N/A 07/18/2016   Procedure: COLONOSCOPY WITH PROPOFOL;  Surgeon: Manya Silvas, MD;  Location: [facility] ENDOSCOPY;  Service: Endoscopy;  Laterality: N/A;   ESOPHAGOGASTRODUODENOSCOPY     TONSILLECTOMY AND ADENOIDECTOMY  1961    Prior to Admission medications   Medication Sig Start Date End Date Taking? Authorizing Provider  atorvastatin (LIPITOR) 80 MG tablet 1 by mouth daily 03/29/18  Yes Venia Carbon, MD  levothyroxine (SYNTHROID, LEVOTHROID) 75 MCG tablet Take 1 tablet (75 mcg total) by mouth daily. 03/29/18  Yes Venia Carbon, MD  metFORMIN (GLUCOPHAGE) 500 MG tablet Take 1 tablet (500 mg total) by mouth 2 (two) times daily with a meal. 03/29/18  Yes Venia Carbon, MD  naproxen sodium (ANAPROX) 220 MG tablet Take 220 mg by mouth 2 (two) times daily with a meal.   Yes [provider]  olmesartan (BENICAR) 40 MG tablet Take 1 tablet (40 mg total) by mouth daily. 05/09/18  Yes Venia Carbon, MD  omeprazole (PRILOSEC) 40 MG capsule Take 1 capsule (40 mg total) by mouth daily. 03/29/18  Yes Venia Carbon, MD    Allergies Tape  Family History  Problem  Relation Age of Onset   Alzheimer's disease Mother    Hyperlipidemia Father    Heart disease Father     Social History Social History   Tobacco Use   Smoking status: Never Smoker   Smokeless tobacco: Never Used  Substance Use Topics   Alcohol use: No   Drug use: No    Review of Systems  Constitutional: No fever. Eyes: No visual changes.   ENT: No neck pain. Cardiovascular: Denies chest pain. Respiratory: Denies shortness of breath. Gastrointestinal: No vomiting. Genitourinary: Negative for flank pain.  Musculoskeletal: Negative for back pain. Skin: Negative for rash. Neurological: Positive for headache.   ____________________________________________   PHYSICAL EXAM:  VITAL SIGNS: ED Triage Vitals  Enc Vitals Group     BP 11/25/18 1907 (!) 194/79     Pulse Rate 11/25/18 1907 73     Resp 11/25/18 1907 17     Temp 11/25/18 1907 98.7 F (37.1 C)     Temp Source 11/25/18 1907 Oral     SpO2 11/25/18 1907 99 %     Weight 11/25/18 1908 164 lb 14.5 oz (74.8 kg)     Height 11/25/18 1908 5' 4.5" (1.638 m)     Head Circumference --      Peak Flow --      Pain Score 11/25/18 1907 6     Pain Loc --      Pain Edu? --      Excl. in [place]? --     Constitutional: Alert and oriented.  Relatively well appearing and in no acute distress. Eyes: Conjunctivae are normal.  EOMI with no diplopia.  PERRLA. Head: Right peri-and infraorbital swelling.  1 cm very superficial laceration to right eyebrow area. Nose: No congestion/rhinnorhea. Mouth/Throat: Mucous membranes are moist.   Neck: Normal range of motion.  No midline spinal tenderness. Cardiovascular: Normal rate, regular rhythm. Good peripheral circulation. Respiratory: Normal respiratory effort.  No retractions.  Gastrointestinal: Abdomen soft and nontender.  No distention.  Genitourinary: No flank tenderness. Musculoskeletal: No lower extremity edema.  Extremities warm and well perfused.  Right anterior inferior rib  area mild tenderness.  No midline spinal tenderness.  Full range of motion at right elbow and right knee with no significant bony tenderness or deformity. Neurologic:  Normal speech and language.  5/5 motor strength and intact sensation to all 4 extremities.  Normal coordination with no ataxia on finger-to-nose.   Skin:  Skin is warm and dry. No rash noted. Psychiatric: Mood and affect are normal. Speech and behavior are normal.  ____________________________________________   LABS (all labs ordered are listed, but only abnormal results are displayed)  Labs Reviewed  BASIC METABOLIC PANEL - Abnormal; Notable for the following components:      Result Value   Glucose, Bld 111 (*)    BUN 25 (*)    Creatinine, Ser 1.11 (*)    GFR calc non Af Amer 48 (*)    GFR calc Af Amer 56 (*)  All other components within normal limits  CBC WITH DIFFERENTIAL/PLATELET - Abnormal; Notable for the following components:   RBC 3.49 (*)    Hemoglobin 8.9 (*)    HCT 29.2 (*)    MCH 25.5 (*)    All other components within normal limits  URINALYSIS, COMPLETE (UACMP) WITH MICROSCOPIC - Abnormal; Notable for the following components:   Color, Urine YELLOW (*)    APPearance HAZY (*)    Protein, ur 30 (*)    Leukocytes,Ua SMALL (*)    All other components within normal limits  TROPONIN I (HIGH SENSITIVITY)  TROPONIN I (HIGH SENSITIVITY)   ____________________________________________  EKG  ED ECG REPORT I, Arta Silence, the attending physician, personally viewed and interpreted this ECG.  Date: 11/25/2018 EKG Time: 2139 Rate: 70 Rhythm: normal sinus rhythm QRS Axis: normal Intervals: Prolonged QTc ST/T Wave abnormalities: Nonspecific ST abnormalities Narrative Interpretation: Nonspecific ST abnormalities with no evidence of acute ischemia; no prior EKG available for comparison  ____________________________________________  RADIOLOGY  CT head: Small hemorrhage in right medial temporal  lobe and right lateral ventricle.  Possible right orbital floor fracture CT maxillofacial: No orbital floor or other acute fracture CT cervical spine: No acute fracture XR ribs right: No acute fracture XR R knee: No acute fracture ____________________________________________   PROCEDURES  Procedure(s) performed: No  Procedures  Critical Care performed: Yes  CRITICAL CARE Performed by: Arta Silence   Total critical care time: 30 minutes  Critical care time was exclusive of separately billable procedures and treating other patients.  Critical care was necessary to treat or prevent imminent or life-threatening deterioration.  Critical care was time spent personally by me on the following activities: development of treatment plan with patient and/or surrogate as well as nursing, discussions with consultants, evaluation of patient's response to treatment, examination of patient, obtaining history from patient or surrogate, ordering and performing treatments and interventions, ordering and review of laboratory studies, ordering and review of radiographic studies, pulse oximetry and re-evaluation of patient's condition. ____________________________________________   INITIAL IMPRESSION / ASSESSMENT AND PLAN / ED COURSE  Pertinent labs & imaging results that were available during my care of the patient were reviewed by me and considered in my medical decision making (see chart for details).  78 year old female with PMH as noted above presents with a head injury after an apparent mechanical fall from standing height.  She also reports some pain in the right knee and right elbow as well as the right lower rib area.  She had no LOC.  On exam, the patient was initially hypertensive with otherwise normal vital signs.  Neurologic exam is nonfocal.  There is some right lower rib area tenderness but no crepitus and no abdominal tenderness.  There is no midline spinal tenderness.  The  remainder of the exam is unremarkable.  CT head obtained from triage revealed a small hemorrhage in the right lateral ventricle as well as a possible orbital floor fracture.  X-rays of the ribs and knee were negative.  I consulted Dr. Francesca Oman from neurosurgery at [facility] to determine whether given the small size of the hemorrhage, the patient could be observed for repeat CT or needed transfer for further evaluation.  He advised that the patient should be observed for 6 hours with a plan for repeat CT at that time and transfer if the bleed is worsening.  He also recommended blood pressure control.  On reassessment, the blood pressure improved without further intervention and has remained in the 150s  to 160s over 90s.  The patient remains neurologically intact.  I obtained a maxillofacial CT to further evaluate the possible orbital floor fracture and it appears negative.  The patient has no clinical evidence of orbital entrapment.  Lab work-up was obtained and is unremarkable.  ----------------------------------------- 12:18 AM on 11/26/2018 -----------------------------------------  I signed the patient out to the oncoming physician Dr. Alfred Levins.  The plan will be repeat CT around 3 AM.  ____________________________________________   FINAL CLINICAL IMPRESSION(S) / ED DIAGNOSES  Final diagnoses:  Syncope, unspecified syncope type  Anemia, unspecified type  Traumatic injury of head, initial encounter  Traumatic intracranial hemorrhage with loss of consciousness of 30 minutes or less, initial encounter ([place])      NEW MEDICATIONS STARTED DURING THIS VISIT:  New Prescriptions   No medications on file     Note:  This document was prepared using Dragon voice recognition software and may include unintentional dictation errors.   Arta Silence, MD 11/26/18 [phone]

## 2018-11-25 NOTE — ED Notes (Signed)
c collar in place

## 2018-11-25 NOTE — ED Notes (Signed)
Patient transported to CT 

## 2018-11-25 NOTE — ED Notes (Signed)
MD at bedside. 

## 2018-11-25 NOTE — ED Notes (Signed)
purewick placed on pt by susan, rn.

## 2018-11-25 NOTE — ED Notes (Signed)
Rigid c collar in place. Pt states fell from standing. Pt denies loc. Pt states has had nausea.

## 2018-11-25 NOTE — ED Triage Notes (Signed)
Pt says she was out to dinner tonight and her shoe caught on the corner of her booth; pt says she fell to the right side; small laceration to right eyebrow with swelling present; c/o right rib pain and right knee pain; denies loss of consciousness; pt c/o headache; awake and alert; oriented x 3; talking in complete coherent sentences;

## 2018-11-26 ENCOUNTER — Other Ambulatory Visit: Payer: Self-pay

## 2018-11-26 ENCOUNTER — Observation Stay: Payer: Medicare Other

## 2018-11-26 ENCOUNTER — Emergency Department: Payer: Medicare Other

## 2018-11-26 ENCOUNTER — Telehealth: Payer: Self-pay | Admitting: Internal Medicine

## 2018-11-26 DIAGNOSIS — S06300A Unspecified focal traumatic brain injury without loss of consciousness, initial encounter: Secondary | ICD-10-CM | POA: Diagnosis not present

## 2018-11-26 DIAGNOSIS — I10 Essential (primary) hypertension: Secondary | ICD-10-CM | POA: Diagnosis not present

## 2018-11-26 DIAGNOSIS — M25521 Pain in right elbow: Secondary | ICD-10-CM | POA: Diagnosis not present

## 2018-11-26 DIAGNOSIS — I619 Nontraumatic intracerebral hemorrhage, unspecified: Secondary | ICD-10-CM | POA: Diagnosis present

## 2018-11-26 DIAGNOSIS — W19XXXA Unspecified fall, initial encounter: Secondary | ICD-10-CM | POA: Diagnosis not present

## 2018-11-26 DIAGNOSIS — I629 Nontraumatic intracranial hemorrhage, unspecified: Secondary | ICD-10-CM | POA: Diagnosis not present

## 2018-11-26 DIAGNOSIS — S59901A Unspecified injury of right elbow, initial encounter: Secondary | ICD-10-CM | POA: Diagnosis not present

## 2018-11-26 DIAGNOSIS — E119 Type 2 diabetes mellitus without complications: Secondary | ICD-10-CM | POA: Diagnosis not present

## 2018-11-26 LAB — GLUCOSE, CAPILLARY
Glucose-Capillary: 125 mg/dL — ABNORMAL HIGH (ref 70–99)
Glucose-Capillary: 95 mg/dL (ref 70–99)
Glucose-Capillary: 103 mg/dL — ABNORMAL HIGH (ref 70–99)

## 2018-11-26 LAB — HEMOGLOBIN A1C
Hgb A1c MFr Bld: 5.5 % (ref 4.8–5.6)
Mean Plasma Glucose: 111.15 mg/dL

## 2018-11-26 LAB — SARS CORONAVIRUS 2 (TAT 6-24 HRS): SARS Coronavirus 2: NEGATIVE

## 2018-11-26 LAB — TROPONIN I (HIGH SENSITIVITY): Troponin I (High Sensitivity): 5 ng/L (ref <18)

## 2018-11-26 LAB — TSH: TSH: 2.62 u[IU]/mL (ref 0.350–4.500)

## 2018-11-26 MED ORDER — OXYCODONE-ACETAMINOPHEN 5-325 MG PO TABS
1.0000 | ORAL_TABLET | Freq: Four times a day (QID) | ORAL | Status: DC | PRN
  Administered 2018-11-26 (×2): 1 via ORAL
  Filled 2018-11-26 (×2): qty 1

## 2018-11-26 MED ORDER — LEVOTHYROXINE SODIUM 75 MCG PO TABS
75.0000 ug | ORAL_TABLET | Freq: Every day | ORAL | Status: DC
  Administered 2018-11-27: 75 ug via ORAL
  Filled 2018-11-26: qty 1
  Filled 2018-11-26: qty 3

## 2018-11-26 MED ORDER — IRBESARTAN 150 MG PO TABS
300.0000 mg | ORAL_TABLET | Freq: Every day | ORAL | Status: DC
  Administered 2018-11-26 – 2018-11-27 (×2): 300 mg via ORAL
  Filled 2018-11-26 (×2): qty 2

## 2018-11-26 MED ORDER — VITAMIN D 25 MCG (1000 UNIT) PO TABS
1000.0000 [IU] | ORAL_TABLET | Freq: Every day | ORAL | Status: DC
  Administered 2018-11-26 – 2018-11-27 (×2): 1000 [IU] via ORAL
  Filled 2018-11-26 (×2): qty 1

## 2018-11-26 MED ORDER — INSULIN ASPART 100 UNIT/ML ~~LOC~~ SOLN: 0.0000 [IU] | Freq: Every day | SUBCUTANEOUS | Status: DC

## 2018-11-26 MED ORDER — ONDANSETRON HCL 4 MG/2ML IJ SOLN: 4.0000 mg | Freq: Four times a day (QID) | INTRAMUSCULAR | Status: DC | PRN

## 2018-11-26 MED ORDER — ONDANSETRON HCL 4 MG PO TABS: 4.0000 mg | ORAL_TABLET | Freq: Four times a day (QID) | ORAL | Status: DC | PRN

## 2018-11-26 MED ORDER — DOCUSATE SODIUM 100 MG PO CAPS
100.0000 mg | ORAL_CAPSULE | Freq: Two times a day (BID) | ORAL | Status: DC
  Administered 2018-11-26 – 2018-11-27 (×3): 100 mg via ORAL
  Filled 2018-11-26 (×3): qty 1

## 2018-11-26 MED ORDER — ATORVASTATIN CALCIUM 80 MG PO TABS
80.0000 mg | ORAL_TABLET | Freq: Every day | ORAL | Status: DC
  Administered 2018-11-26: 18:00:00 80 mg via ORAL
  Filled 2018-11-26: qty 4
  Filled 2018-11-26 (×2): qty 1

## 2018-11-26 MED ORDER — INSULIN ASPART 100 UNIT/ML ~~LOC~~ SOLN
0.0000 [IU] | Freq: Three times a day (TID) | SUBCUTANEOUS | Status: DC
  Administered 2018-11-26: 13:00:00 1 [IU] via SUBCUTANEOUS
  Filled 2018-11-26: qty 1

## 2018-11-26 MED ORDER — PANTOPRAZOLE SODIUM 40 MG PO TBEC
80.0000 mg | DELAYED_RELEASE_TABLET | Freq: Every day | ORAL | Status: DC
  Administered 2018-11-26 – 2018-11-27 (×2): 80 mg via ORAL
  Filled 2018-11-26 (×2): qty 2

## 2018-11-26 MED ORDER — ACETAMINOPHEN 650 MG RE SUPP: 650.0000 mg | Freq: Four times a day (QID) | RECTAL | Status: DC | PRN

## 2018-11-26 MED ORDER — ACETAMINOPHEN 325 MG PO TABS
650.0000 mg | ORAL_TABLET | Freq: Four times a day (QID) | ORAL | Status: DC | PRN
  Administered 2018-11-27: 650 mg via ORAL
  Filled 2018-11-26: qty 2

## 2018-11-26 NOTE — Progress Notes (Signed)
Family Meeting Note  Advance Directive:yes  Today a meeting took place with the Patient.  The following clinical team members were present during this meeting:MD  The following were discussed:Patient's diagnosis: intracranial hemorrhage, Patient's progosis: Unable to determine and Goals for treatment: Full Code  Additional follow-up to be provided: PMD  Time spent during discussion:20 minutes  Vaughan Basta, MD

## 2018-11-26 NOTE — H&P (Signed)
Sandra Montgomery is an 78 y.o. female.   Chief Complaint: Fall HPI: The patient with past medical history of hypertension, diabetes and hyperlipidemia presents to the emergency department following a fall.  The patient tripped and fell earlier this evening and bumped the right side of her head.  The patient developed a headache but denied loss of consciousness.  She also denies nausea or vomiting.  The patient denies dizziness at this time although she admits to feeling some vertigo earlier this week.  CT of her head revealed a small bleed of the medial right temporal lobe.  Neurosurgery was consulted prior to the emergency department staff calling the hospitalist service for admission.  Past Medical History:  Diagnosis Date  . Allergic rhinitis, cause unspecified   . Cancer (HCC)    shoulder carcinoma  . Diabetes mellitus without complication ([place])   . GERD (gastroesophageal reflux disease)   . History of blood transfusion   . Hyperlipidemia   . Hypertension   . Impaired fasting glucose   . Osteoarthrosis involving, or with mention of more than one site, but not specified as generalized, multiple sites   . Osteoporosis   . Pneumothorax 1965   prolonged bed rest   . Unspecified hypothyroidism     Past Surgical History:  Procedure Laterality Date  . ABDOMINAL HYSTERECTOMY  1981  . BREAST EXCISIONAL BIOPSY Left years ago   x 2. Negative.   Marland Kitchen BREAST SURGERY  1985   non cancerous mass removed  . CATARACT EXTRACTION W/PHACO Left 04/26/2017   Procedure: CATARACT EXTRACTION PHACO AND INTRAOCULAR LENS PLACEMENT ([place])  LEFT;  Surgeon: Leandrew Koyanagi, MD;  Location: [place];  Service: Ophthalmology;  Laterality: Left;  Diabetic - oral meds  . CATARACT EXTRACTION W/PHACO Right 05/24/2017   Procedure: CATARACT EXTRACTION PHACO AND INTRAOCULAR LENS PLACEMENT ([place]) RIGHT DIABETIC;  Surgeon: Leandrew Koyanagi, MD;  Location: [place];  Service: Ophthalmology;   Laterality: Right;  Diabetic - oral meds  . COLONOSCOPY    . COLONOSCOPY WITH PROPOFOL N/A 07/18/2016   Procedure: COLONOSCOPY WITH PROPOFOL;  Surgeon: Manya Silvas, MD;  Location: [facility] ENDOSCOPY;  Service: Endoscopy;  Laterality: N/A;  . ESOPHAGOGASTRODUODENOSCOPY    . TONSILLECTOMY AND ADENOIDECTOMY  1961    Family History  Problem Relation Age of Onset  . Alzheimer's disease Mother   . Hyperlipidemia Father   . Heart disease Father    Social History:  reports that she has never smoked. She has never used smokeless tobacco. She reports that she does not drink alcohol or use drugs.  Allergies:  Allergies  Allergen Reactions  . Short Ragweed Pollen Ext   . Tape Rash    And peels the skin And peels the skin    Prior to Admission medications   Medication Sig Start Date End Date Taking? Authorizing Provider  atorvastatin (LIPITOR) 80 MG tablet 1 by mouth daily 03/29/18  Yes Venia Carbon, MD  Biotin 1000 MCG tablet Take 1 mg by mouth 3 (three) times daily.   Yes [provider]  Cholecalciferol (D3-1000) 25 MCG (1000 UT) capsule Take 1,000 Units by mouth daily.   Yes [provider]  levothyroxine (SYNTHROID, LEVOTHROID) 75 MCG tablet Take 1 tablet (75 mcg total) by mouth daily. 03/29/18  Yes Venia Carbon, MD  metFORMIN (GLUCOPHAGE) 500 MG tablet Take 1 tablet (500 mg total) by mouth 2 (two) times daily with a meal. 03/29/18  Yes Venia Carbon, MD  naproxen sodium (  ANAPROX) 220 MG tablet Take 220 mg by mouth 2 (two) times daily with a meal.   Yes [provider]  olmesartan (BENICAR) 40 MG tablet Take 1 tablet (40 mg total) by mouth daily. 05/09/18  Yes Venia Carbon, MD  omeprazole (PRILOSEC) 40 MG capsule Take 1 capsule (40 mg total) by mouth daily. 03/29/18  Yes Viviana Simpler I, MD  triamcinolone cream (KENALOG) 0.1 % Apply 1 application topically 2 (two) times daily as needed.   Yes [provider]     Results for orders  placed or performed during the hospital encounter of 11/25/18 (from the past 48 hour(s))  Urinalysis, Complete w Microscopic     Status: Abnormal   Collection Time: 11/25/18  7:25 PM  Result Value Ref Range   Color, Urine YELLOW (A) YELLOW   APPearance HAZY (A) CLEAR   Specific Gravity, Urine 1.021 1.005 - 1.030   pH 5.0 5.0 - 8.0   Glucose, UA NEGATIVE NEGATIVE mg/dL   Hgb urine dipstick NEGATIVE NEGATIVE   Bilirubin Urine NEGATIVE NEGATIVE   Ketones, ur NEGATIVE NEGATIVE mg/dL   Protein, ur 30 (A) NEGATIVE mg/dL   Nitrite NEGATIVE NEGATIVE   Leukocytes,Ua SMALL (A) NEGATIVE   RBC / HPF 0-5 0 - 5 RBC/hpf   WBC, UA 0-5 0 - 5 WBC/hpf   Bacteria, UA NONE SEEN NONE SEEN   Squamous Epithelial / LPF 6-10 0 - 5   Mucus PRESENT     Comment: Performed at [facility], [address] [place], [place] [id number]  Basic metabolic panel     Status: Abnormal   Collection Time: 11/25/18  7:36 PM  Result Value Ref Range   Sodium 137 135 - 145 mmol/L   Potassium 3.9 3.5 - 5.1 mmol/L   Chloride 101 98 - 111 mmol/L   CO2 25 22 - 32 mmol/L   Glucose, Bld 111 (H) 70 - 99 mg/dL   BUN 25 (H) 8 - 23 mg/dL   Creatinine, Ser 1.11 (H) 0.44 - 1.00 mg/dL   Calcium 9.7 8.9 - 10.3 mg/dL   GFR calc non Af Amer 48 (L) >60 mL/min   GFR calc Af Amer 56 (L) >60 mL/min   Anion gap 11 5 - 15    Comment: Performed at [facility], [place], [place] [address]  Troponin I (High Sensitivity)     Status: None   Collection Time: 11/25/18  7:36 PM  Result Value Ref Range   Troponin I (High Sensitivity) 6 <18 ng/L    Comment: (NOTE) Elevated high sensitivity troponin I (hsTnI) values and significant  changes across serial measurements may suggest ACS but many other  chronic and acute conditions are known to elevate hsTnI results.  Refer to the "Links" section for chest pain algorithms and additional  guidance. Performed at [facility], [address]  [place],  [address]   CBC with Differential     Status: Abnormal   Collection Time: 11/25/18  7:36 PM  Result Value Ref Range   WBC 10.4 4.0 - 10.5 K/uL   RBC 3.49 (L) 3.87 - 5.11 MIL/uL   Hemoglobin 8.9 (L) 12.0 - 15.0 g/dL   HCT 29.2 (L) 36.0 - 46.0 %   MCV 83.7 80.0 - 100.0 fL   MCH 25.5 (L) 26.0 - 34.0 pg   MCHC 30.5 30.0 - 36.0 g/dL   RDW 14.4 11.5 - 15.5 %   Platelets 372 150 - 400 K/uL  nRBC 0.0 0.0 - 0.2 %   Neutrophils Relative % 74 %   Neutro Abs 7.7 1.7 - 7.7 K/uL   Lymphocytes Relative 14 %   Lymphs Abs 1.5 0.7 - 4.0 K/uL   Monocytes Relative 8 %   Monocytes Absolute 0.8 0.1 - 1.0 K/uL   Eosinophils Relative 2 %   Eosinophils Absolute 0.3 0.0 - 0.5 K/uL   Basophils Relative 1 %   Basophils Absolute 0.1 0.0 - 0.1 K/uL   Immature Granulocytes 1 %   Abs Immature Granulocytes 0.06 0.00 - 0.07 K/uL    Comment: Performed at [facility], [place], [place] [address]  Troponin I (High Sensitivity)     Status: None   Collection Time: 11/26/18 12:08 AM  Result Value Ref Range   Troponin I (High Sensitivity) 5 <18 ng/L    Comment: (NOTE) Elevated high sensitivity troponin I (hsTnI) values and significant  changes across serial measurements may suggest ACS but many other  chronic and acute conditions are known to elevate hsTnI results.  Refer to the "Links" section for chest pain algorithms and additional  guidance. Performed at [facility], [address] [place], [place] [address]    Dg Ribs Unilateral W/chest Right  Result Date: 11/25/2018 CLINICAL DATA:  Fall with right-sided rib pain EXAM: RIGHT RIBS AND CHEST - 3+ VIEW COMPARISON:  None. FINDINGS: No fracture or other bone lesions are seen involving the ribs. There is no evidence of pneumothorax or pleural effusion. Both lungs are clear. Heart size and mediastinal contours are within normal limits. IMPRESSION: Negative. Electronically Signed   By: Ulyses Jarred M.D.   On:  11/25/2018 20:27   Ct Head Wo Contrast  Result Date: 11/26/2018 CLINICAL DATA:  78 year old female with follow-up of intracranial hemorrhage. EXAM: CT HEAD WITHOUT CONTRAST TECHNIQUE: Contiguous axial images were obtained from the base of the skull through the vertex without intravenous contrast. COMPARISON:  Head CT dated 11/25/2018 FINDINGS: Brain: No significant change in the amount of bleed in the medial right temporal lobe likely in the region of the temporal horn of the right lateral ventricle or along the superior aspect of the epicanthus. No new bleed. There is mild age-related atrophy and chronic microvascular ischemic changes. No mass 3 effect or midline shift. Vascular: No hyperdense vessel or unexpected calcification. Skull: Normal. Negative for fracture or focal lesion. Sinuses/Orbits: No acute finding. Other: None IMPRESSION: 1. No significant change in the amount of bleed in the medial right temporal lobe. No new bleed. Continued follow-up recommended. 2. Mild age-related atrophy and chronic microvascular ischemic changes. Electronically Signed   By: Anner Crete M.D.   On: 11/26/2018 03:13   Ct Head Wo Contrast  Result Date: 11/25/2018 CLINICAL DATA:  Fall, headache EXAM: CT HEAD WITHOUT CONTRAST CT CERVICAL SPINE WITHOUT CONTRAST TECHNIQUE: Multidetector CT imaging of the head and cervical spine was performed following the standard protocol without intravenous contrast. Multiplanar CT image reconstructions of the cervical spine were also generated. COMPARISON:  None. FINDINGS: CT HEAD FINDINGS Brain: There is minimal blood product in the medial right temporal lobe, which appears to be within the temporal horn of the right lateral ventricle and adjacent hippocampus (series 2, image 10). Vascular: No hyperdense vessel or unexpected calcification. Skull: Normal. Negative for fracture or focal lesion. Sinuses/Orbits: There is a depressed fracture of the floor of the right orbit, partially  imaged, the fracture fragment measuring approximately 12 mm with approximately 4 mm depression (series  3, image 8, series 4, image 14). There is mild inferior rectus herniation. Partially imaged air-fluid level of the right maxillary sinus. Other: Soft tissue edema over the right eye. CT CERVICAL SPINE FINDINGS Alignment: Normal. Skull base and vertebrae: No acute fracture. No primary bone lesion or focal pathologic process. Soft tissues and spinal canal: No prevertebral fluid or swelling. No visible canal hematoma. Disc levels: Moderate to severe multilevel disc space height loss and osteophytosis. Upper chest: Negative. Other: None. IMPRESSION: 1. There is minimal blood product in the medial right temporal lobe, which appears to be within the temporal horn of the right lateral ventricle and adjacent hippocampus (series 2, image 10). 2.  Small-vessel white matter disease. 3. There is a depressed fracture of the floor of the right orbit, partially imaged, the fracture fragment measuring approximately 12 mm with approximately 4 mm depression (series 3, image 8, series 4, image 14). There is mild inferior rectus herniation. Partially imaged air-fluid level of the right maxillary sinus. 4. No fracture or static subluxation of the cervical spine. Multilevel disc degenerative disease. These results were called by telephone at the time of interpretation on 11/25/2018 at 8:44 pm to Dr. Cherylann Banas , who verbally acknowledged these results. Electronically Signed   By: Eddie Candle M.D.   On: 11/25/2018 20:47   Ct Cervical Spine Wo Contrast  Result Date: 11/25/2018 CLINICAL DATA:  Fall, headache EXAM: CT HEAD WITHOUT CONTRAST CT CERVICAL SPINE WITHOUT CONTRAST TECHNIQUE: Multidetector CT imaging of the head and cervical spine was performed following the standard protocol without intravenous contrast. Multiplanar CT image reconstructions of the cervical spine were also generated. COMPARISON:  None. FINDINGS: CT HEAD FINDINGS  Brain: There is minimal blood product in the medial right temporal lobe, which appears to be within the temporal horn of the right lateral ventricle and adjacent hippocampus (series 2, image 10). Vascular: No hyperdense vessel or unexpected calcification. Skull: Normal. Negative for fracture or focal lesion. Sinuses/Orbits: There is a depressed fracture of the floor of the right orbit, partially imaged, the fracture fragment measuring approximately 12 mm with approximately 4 mm depression (series 3, image 8, series 4, image 14). There is mild inferior rectus herniation. Partially imaged air-fluid level of the right maxillary sinus. Other: Soft tissue edema over the right eye. CT CERVICAL SPINE FINDINGS Alignment: Normal. Skull base and vertebrae: No acute fracture. No primary bone lesion or focal pathologic process. Soft tissues and spinal canal: No prevertebral fluid or swelling. No visible canal hematoma. Disc levels: Moderate to severe multilevel disc space height loss and osteophytosis. Upper chest: Negative. Other: None. IMPRESSION: 1. There is minimal blood product in the medial right temporal lobe, which appears to be within the temporal horn of the right lateral ventricle and adjacent hippocampus (series 2, image 10). 2.  Small-vessel white matter disease. 3. There is a depressed fracture of the floor of the right orbit, partially imaged, the fracture fragment measuring approximately 12 mm with approximately 4 mm depression (series 3, image 8, series 4, image 14). There is mild inferior rectus herniation. Partially imaged air-fluid level of the right maxillary sinus. 4. No fracture or static subluxation of the cervical spine. Multilevel disc degenerative disease. These results were called by telephone at the time of interpretation on 11/25/2018 at 8:44 pm to Dr. Cherylann Banas , who verbally acknowledged these results. Electronically Signed   By: Eddie Candle M.D.   On: 11/25/2018 20:47   Dg Knee Complete 4 Views  Right  Result Date: 11/25/2018  CLINICAL DATA:  Fall EXAM: RIGHT KNEE - COMPLETE 4+ VIEW COMPARISON:  None. FINDINGS: There is mild femorotibial and moderate patellofemoral osteoarthrosis. No acute fracture or dislocation. No knee effusion. IMPRESSION: No acute fracture or dislocation of the right knee. Mild-to-moderate tricompartmental osteoarthrosis. Electronically Signed   By: Ulyses Jarred M.D.   On: 11/25/2018 20:25   Ct Maxillofacial Wo Contrast  Result Date: 11/25/2018 CLINICAL DATA:  Fall with facial trauma EXAM: CT MAXILLOFACIAL WITHOUT CONTRAST TECHNIQUE: Multidetector CT imaging of the maxillofacial structures was performed. Multiplanar CT image reconstructions were also generated. COMPARISON:  None. FINDINGS: Osseous: --Complex facial fracture types: No Le[place], zygomaticomaxillary complex or nasoorbitoethmoidal fracture. --Simple fracture types: None. --Mandible, hard palate and teeth: No acute abnormality. Orbits: The globes are intact. Normal appearance of the intra- and extraconal fat. Symmetric extraocular muscles. Sinuses: No fluid levels or advanced mucosal thickening. Soft tissues: Small right periorbital hematoma. Limited intracranial: Normal. IMPRESSION: Small right periorbital hematoma without facial fracture. Electronically Signed   By: Ulyses Jarred M.D.   On: 11/25/2018 23:44    Review of Systems  Constitutional: Negative for chills and fever.  HENT: Negative for sore throat and tinnitus.   Eyes: Negative for blurred vision and redness.  Respiratory: Negative for cough and shortness of breath.   Cardiovascular: Negative for chest pain, palpitations, orthopnea and PND.  Gastrointestinal: Negative for abdominal pain, diarrhea, nausea and vomiting.  Genitourinary: Negative for dysuria, frequency and urgency.  Musculoskeletal: Positive for falls. Negative for joint pain and myalgias.  Skin: Negative for rash.       No lesions  Neurological: Positive for headaches. Negative for  speech change, focal weakness and weakness.  Endo/Heme/Allergies: Does not bruise/bleed easily.       No temperature intolerance  Psychiatric/Behavioral: Negative for depression and suicidal ideas.    Blood pressure 117/68, pulse (!) 56, temperature 98.7 F (37.1 C), temperature source Oral, resp. rate 12, height 5' 4.5" (1.638 m), weight 74.8 kg, SpO2 95 %. Physical Exam  Vitals reviewed. Constitutional: She is oriented to person, place, and time. She appears well-developed and well-nourished. No distress.  HENT:  Head: Normocephalic.  Mouth/Throat: Oropharynx is clear and moist.  Bruising of right forehead as well as slight laceration to right orbit  Eyes: Pupils are equal, round, and reactive to light. Conjunctivae and EOM are normal. No scleral icterus.  Neck: Normal range of motion. Neck supple. No JVD present. No tracheal deviation present. No thyromegaly present.  Cardiovascular: Normal rate, regular rhythm and normal heart sounds. Exam reveals no gallop and no friction rub.  No murmur heard. Respiratory: Effort normal and breath sounds normal.  GI: Soft. Bowel sounds are normal. She exhibits no distension. There is no abdominal tenderness.  Genitourinary:    Genitourinary Comments: Deferred   Musculoskeletal: Normal range of motion.        General: No edema.  Lymphadenopathy:    She has no cervical adenopathy.  Neurological: She is alert and oriented to person, place, and time. No cranial nerve deficit. She exhibits normal muscle tone.  Skin: Skin is warm and dry. No rash noted. No erythema.  Psychiatric: She has a normal mood and affect. Her behavior is normal. Judgment and thought content normal.     Assessment/Plan This is a 78 year old female admitted for cerebral hemorrhage. 1.  Intracranial hemorrhage: Small; follow-up CT shows no change in size.  Neurosurgery agrees to see the patient in consultation.  No neurologic deficits.  Control blood pressure.  Manage headache.  No anticoagulation. 2.  Hypertension: Controlled; continue ARB 3.  Diabetes mellitus type 2: Hold metformin for now.  Sliding scale insulin while hospitalized 4.  Hypothyroidism: Check TSH; continue Synthroid 5.  Falls: PT/OT evaluation for safety prior to discharge 6.  DVT prophylaxis: SCDs 7.  GI prophylaxis: PPI per home regimen The patient is a full code.  Time spent on admission orders and patient care approximately 45 minutes  Harrie Foreman, MD 11/26/2018, 7:21 AM

## 2018-11-26 NOTE — ED Notes (Signed)
Patient transported to CT 

## 2018-11-26 NOTE — ED Notes (Signed)
Pt sleeping. resps unlabored.

## 2018-11-26 NOTE — ED Notes (Signed)
Pt continues to sleep. Respirations unlabored, call bell at left side.

## 2018-11-26 NOTE — Telephone Encounter (Signed)
Spoke to her in the hospital She is doing fairly well CT today showed no increase in the bleed--likely won't need to stay long

## 2018-11-26 NOTE — ED Notes (Signed)
Assisted MD with rectal exam

## 2018-11-26 NOTE — ED Notes (Signed)
Pt sleeping. Call bell at left side.  

## 2018-11-26 NOTE — ED Notes (Signed)
Explanation of admission process provided to pt who verbalizes understanding. Call bell at left side.

## 2018-11-26 NOTE — ED Notes (Signed)
Patient states her glasses should be located at the head of her bed. Patient's glasses not seen by this RN or Helene Kelp, Therapist, sports. Patient's bed linen and entire stretcher checked by this RN with no success. Upon returning to ED, this RN emptied linen basket and EVS sorted through both trash cans in room without finding glasses. Charge RN made aware.

## 2018-11-26 NOTE — ED Notes (Signed)
PTS DAUGHTER CAN BE REACHED TONIGHT AT [phone].

## 2018-11-26 NOTE — ED Provider Notes (Signed)
----------------------------------------- 3:26 AM on 11/26/2018 -----------------------------------------   Blood pressure 127/62, pulse (!) 57, temperature 98.7 F (37.1 C), temperature source Oral, resp. rate 12, height 5' 4.5" (1.638 m), weight 74.8 kg, SpO2 95 %.  Assuming care from Dr. Cherylann Banas of [place] is a 78 y.o. female with a chief complaint of Fall and Laceration .    Please refer to H&P by previous MD for further details. 9F who presents for fall and syncope at a restaurant. GCS 15, A&O x 3, neuro intact. Patient has had several falls and questionable syncopal/ near syncopal events for the last 2-3 weeks. Initial EKG showing small intracranial bleed. Dr. Cherylann Banas consulted NSG who recommended monitoring patient for 6 hours and repeating CT and if the bleed is unchanged and she remains neurologically intact with normal mental status the patient was cleared from their perspective.  Her labs show new anemia.  I performed a rectal exam which showed light brown stool guaiac negative.  Patient had a colonoscopy done in 2018 showing diverticulosis but no other acute findings.  She does endorse intermittent episodes of watery black stool.  She is not on any blood thinners.  She is otherwise hemodynamically stable.  Based on her age, new onset of anemia, and several near syncopal episodes over the last few weeks I recommended admission to the hospital which patient agreed to.  Her repeat CT was unchanged from prior.  She will be admitted by Dr. Marcille Blanco.   I have personally reviewed the images performed during this visit and I agree with the Radiologist's read.   Interpretation by Radiologist:  Dg Ribs Unilateral W/chest Right  Result Date: 11/25/2018 CLINICAL DATA:  Fall with right-sided rib pain EXAM: RIGHT RIBS AND CHEST - 3+ VIEW COMPARISON:  None. FINDINGS: No fracture or other bone lesions are seen involving the ribs. There is no evidence of pneumothorax or pleural effusion.  Both lungs are clear. Heart size and mediastinal contours are within normal limits. IMPRESSION: Negative. Electronically Signed   By: Ulyses Jarred M.D.   On: 11/25/2018 20:27   Ct Head Wo Contrast  Result Date: 11/26/2018 CLINICAL DATA:  78 year old female with follow-up of intracranial hemorrhage. EXAM: CT HEAD WITHOUT CONTRAST TECHNIQUE: Contiguous axial images were obtained from the base of the skull through the vertex without intravenous contrast. COMPARISON:  Head CT dated 11/25/2018 FINDINGS: Brain: No significant change in the amount of bleed in the medial right temporal lobe likely in the region of the temporal horn of the right lateral ventricle or along the superior aspect of the epicanthus. No new bleed. There is mild age-related atrophy and chronic microvascular ischemic changes. No mass 3 effect or midline shift. Vascular: No hyperdense vessel or unexpected calcification. Skull: Normal. Negative for fracture or focal lesion. Sinuses/Orbits: No acute finding. Other: None IMPRESSION: 1. No significant change in the amount of bleed in the medial right temporal lobe. No new bleed. Continued follow-up recommended. 2. Mild age-related atrophy and chronic microvascular ischemic changes. Electronically Signed   By: Anner Crete M.D.   On: 11/26/2018 03:13   Ct Head Wo Contrast  Result Date: 11/25/2018 CLINICAL DATA:  Fall, headache EXAM: CT HEAD WITHOUT CONTRAST CT CERVICAL SPINE WITHOUT CONTRAST TECHNIQUE: Multidetector CT imaging of the head and cervical spine was performed following the standard protocol without intravenous contrast. Multiplanar CT image reconstructions of the cervical spine were also generated. COMPARISON:  None. FINDINGS: CT HEAD FINDINGS Brain: There is minimal blood product in the medial right temporal  lobe, which appears to be within the temporal horn of the right lateral ventricle and adjacent hippocampus (series 2, image 10). Vascular: No hyperdense vessel or unexpected  calcification. Skull: Normal. Negative for fracture or focal lesion. Sinuses/Orbits: There is a depressed fracture of the floor of the right orbit, partially imaged, the fracture fragment measuring approximately 12 mm with approximately 4 mm depression (series 3, image 8, series 4, image 14). There is mild inferior rectus herniation. Partially imaged air-fluid level of the right maxillary sinus. Other: Soft tissue edema over the right eye. CT CERVICAL SPINE FINDINGS Alignment: Normal. Skull base and vertebrae: No acute fracture. No primary bone lesion or focal pathologic process. Soft tissues and spinal canal: No prevertebral fluid or swelling. No visible canal hematoma. Disc levels: Moderate to severe multilevel disc space height loss and osteophytosis. Upper chest: Negative. Other: None. IMPRESSION: 1. There is minimal blood product in the medial right temporal lobe, which appears to be within the temporal horn of the right lateral ventricle and adjacent hippocampus (series 2, image 10). 2.  Small-vessel white matter disease. 3. There is a depressed fracture of the floor of the right orbit, partially imaged, the fracture fragment measuring approximately 12 mm with approximately 4 mm depression (series 3, image 8, series 4, image 14). There is mild inferior rectus herniation. Partially imaged air-fluid level of the right maxillary sinus. 4. No fracture or static subluxation of the cervical spine. Multilevel disc degenerative disease. These results were called by telephone at the time of interpretation on 11/25/2018 at 8:44 pm to Dr. Cherylann Banas , who verbally acknowledged these results. Electronically Signed   By: Eddie Candle M.D.   On: 11/25/2018 20:47   Ct Cervical Spine Wo Contrast  Result Date: 11/25/2018 CLINICAL DATA:  Fall, headache EXAM: CT HEAD WITHOUT CONTRAST CT CERVICAL SPINE WITHOUT CONTRAST TECHNIQUE: Multidetector CT imaging of the head and cervical spine was performed following the standard  protocol without intravenous contrast. Multiplanar CT image reconstructions of the cervical spine were also generated. COMPARISON:  None. FINDINGS: CT HEAD FINDINGS Brain: There is minimal blood product in the medial right temporal lobe, which appears to be within the temporal horn of the right lateral ventricle and adjacent hippocampus (series 2, image 10). Vascular: No hyperdense vessel or unexpected calcification. Skull: Normal. Negative for fracture or focal lesion. Sinuses/Orbits: There is a depressed fracture of the floor of the right orbit, partially imaged, the fracture fragment measuring approximately 12 mm with approximately 4 mm depression (series 3, image 8, series 4, image 14). There is mild inferior rectus herniation. Partially imaged air-fluid level of the right maxillary sinus. Other: Soft tissue edema over the right eye. CT CERVICAL SPINE FINDINGS Alignment: Normal. Skull base and vertebrae: No acute fracture. No primary bone lesion or focal pathologic process. Soft tissues and spinal canal: No prevertebral fluid or swelling. No visible canal hematoma. Disc levels: Moderate to severe multilevel disc space height loss and osteophytosis. Upper chest: Negative. Other: None. IMPRESSION: 1. There is minimal blood product in the medial right temporal lobe, which appears to be within the temporal horn of the right lateral ventricle and adjacent hippocampus (series 2, image 10). 2.  Small-vessel white matter disease. 3. There is a depressed fracture of the floor of the right orbit, partially imaged, the fracture fragment measuring approximately 12 mm with approximately 4 mm depression (series 3, image 8, series 4, image 14). There is mild inferior rectus herniation. Partially imaged air-fluid level of the right maxillary sinus. 4.  No fracture or static subluxation of the cervical spine. Multilevel disc degenerative disease. These results were called by telephone at the time of interpretation on 11/25/2018 at  8:44 pm to Dr. Cherylann Banas , who verbally acknowledged these results. Electronically Signed   By: Eddie Candle M.D.   On: 11/25/2018 20:47   Dg Knee Complete 4 Views Right  Result Date: 11/25/2018 CLINICAL DATA:  Fall EXAM: RIGHT KNEE - COMPLETE 4+ VIEW COMPARISON:  None. FINDINGS: There is mild femorotibial and moderate patellofemoral osteoarthrosis. No acute fracture or dislocation. No knee effusion. IMPRESSION: No acute fracture or dislocation of the right knee. Mild-to-moderate tricompartmental osteoarthrosis. Electronically Signed   By: Ulyses Jarred M.D.   On: 11/25/2018 20:25   Ct Maxillofacial Wo Contrast  Result Date: 11/25/2018 CLINICAL DATA:  Fall with facial trauma EXAM: CT MAXILLOFACIAL WITHOUT CONTRAST TECHNIQUE: Multidetector CT imaging of the maxillofacial structures was performed. Multiplanar CT image reconstructions were also generated. COMPARISON:  None. FINDINGS: Osseous: --Complex facial fracture types: No LeFort, zygomaticomaxillary complex or nasoorbitoethmoidal fracture. --Simple fracture types: None. --Mandible, hard palate and teeth: No acute abnormality. Orbits: The globes are intact. Normal appearance of the intra- and extraconal fat. Symmetric extraocular muscles. Sinuses: No fluid levels or advanced mucosal thickening. Soft tissues: Small right periorbital hematoma. Limited intracranial: Normal. IMPRESSION: Small right periorbital hematoma without facial fracture. Electronically Signed   By: Ulyses Jarred M.D.   On: 11/25/2018 23:44     .        Alfred Levins, [place], MD 11/26/18 0330

## 2018-11-26 NOTE — ED Notes (Signed)
Report to teresa and mitch, rn.

## 2018-11-26 NOTE — ED Notes (Signed)
Pt sleeping. 

## 2018-11-26 NOTE — ED Notes (Signed)
Dr. Diamond in to see pt.  

## 2018-11-26 NOTE — Progress Notes (Signed)
[place]  at [place] NAME: Sandra Montgomery    MR#:  [id number]  DATE OF BIRTH:  [birth year]  SUBJECTIVE:  CHIEF COMPLAINT:   Chief Complaint  Patient presents with  . Fall  . Laceration   Patient came after having a fall-found to have a small intracranial hemorrhage.  No symptoms other than pain. REVIEW OF SYSTEMS:  CONSTITUTIONAL: No fever, fatigue or weakness.  EYES: No blurred or double vision.  EARS, NOSE, AND THROAT: No tinnitus or ear pain.  RESPIRATORY: No cough, shortness of breath, wheezing or hemoptysis.  CARDIOVASCULAR: No chest pain, orthopnea, edema.  GASTROINTESTINAL: No nausea, vomiting, diarrhea or abdominal pain.  GENITOURINARY: No dysuria, hematuria.  ENDOCRINE: No polyuria, nocturia,  HEMATOLOGY: No anemia, easy bruising or bleeding SKIN: No rash or lesion. MUSCULOSKELETAL: Right side elbow and knee pain NEUROLOGIC: No tingling, numbness, weakness.  PSYCHIATRY: No anxiety or depression.   ROS  DRUG ALLERGIES:   Allergies  Allergen Reactions  . Short Ragweed Pollen Ext   . Tape Rash    And peels the skin And peels the skin    VITALS:  Blood pressure (!) 153/68, pulse 62, temperature 98.5 F (36.9 C), temperature source Oral, resp. rate 18, height 5\' 5"  (1.651 m), weight 74.8 kg, SpO2 100 %.  PHYSICAL EXAMINATION:  GENERAL:  78 y.o.-year-old patient lying in the bed with no acute distress.  EYES: Pupils equal, round, reactive to light and accommodation. No scleral icterus. Extraocular muscles intact.  HEENT: Right side ecchymosis around the eye, normocephalic. Oropharynx and nasopharynx clear.  NECK:  Supple, no jugular venous distention. No thyroid enlargement, no tenderness.  LUNGS: Normal breath sounds bilaterally, no wheezing, rales,rhonchi or crepitation. No use of accessory muscles of respiration.  CARDIOVASCULAR: S1, S2 normal. No murmurs, rubs, or gallops.  ABDOMEN: Soft, nontender, nondistended.  Bowel sounds present. No organomegaly or mass.  EXTREMITIES: No pedal edema, cyanosis, or clubbing.  Lightly tender on right elbow. NEUROLOGIC: Cranial nerves II through XII are intact. Muscle strength 5/5 in all extremities. Sensation intact. Gait not checked.  PSYCHIATRIC: The patient is alert and oriented x 3.  SKIN: No obvious rash, lesion, or ulcer.   Physical Exam LABORATORY PANEL:   CBC Recent Labs  Lab 11/25/18 1936  WBC 10.4  HGB 8.9*  HCT 29.2*  PLT 372   ------------------------------------------------------------------------------------------------------------------  Chemistries  Recent Labs  Lab 11/25/18 1936  NA 137  K 3.9  CL 101  CO2 25  GLUCOSE 111*  BUN 25*  CREATININE 1.11*  CALCIUM 9.7   ------------------------------------------------------------------------------------------------------------------  Cardiac Enzymes No results for input(s): TROPONINI in the last 168 hours. ------------------------------------------------------------------------------------------------------------------  RADIOLOGY:  Dg Ribs Unilateral W/chest Right  Result Date: 11/25/2018 CLINICAL DATA:  Fall with right-sided rib pain EXAM: RIGHT RIBS AND CHEST - 3+ VIEW COMPARISON:  None. FINDINGS: No fracture or other bone lesions are seen involving the ribs. There is no evidence of pneumothorax or pleural effusion. Both lungs are clear. Heart size and mediastinal contours are within normal limits. IMPRESSION: Negative. Electronically Signed   By: Ulyses Jarred M.D.   On: 11/25/2018 20:27   Ct Head Wo Contrast  Result Date: 11/26/2018 CLINICAL DATA:  78 year old female with follow-up of intracranial hemorrhage. EXAM: CT HEAD WITHOUT CONTRAST TECHNIQUE: Contiguous axial images were obtained from the base of the skull through the vertex without intravenous contrast. COMPARISON:  Head CT dated 11/25/2018 FINDINGS: Brain: No significant change in the amount of bleed in the medial  right  temporal lobe likely in the region of the temporal horn of the right lateral ventricle or along the superior aspect of the epicanthus. No new bleed. There is mild age-related atrophy and chronic microvascular ischemic changes. No mass 3 effect or midline shift. Vascular: No hyperdense vessel or unexpected calcification. Skull: Normal. Negative for fracture or focal lesion. Sinuses/Orbits: No acute finding. Other: None IMPRESSION: 1. No significant change in the amount of bleed in the medial right temporal lobe. No new bleed. Continued follow-up recommended. 2. Mild age-related atrophy and chronic microvascular ischemic changes. Electronically Signed   By: Anner Crete M.D.   On: 11/26/2018 03:13   Ct Head Wo Contrast  Result Date: 11/25/2018 CLINICAL DATA:  Fall, headache EXAM: CT HEAD WITHOUT CONTRAST CT CERVICAL SPINE WITHOUT CONTRAST TECHNIQUE: Multidetector CT imaging of the head and cervical spine was performed following the standard protocol without intravenous contrast. Multiplanar CT image reconstructions of the cervical spine were also generated. COMPARISON:  None. FINDINGS: CT HEAD FINDINGS Brain: There is minimal blood product in the medial right temporal lobe, which appears to be within the temporal horn of the right lateral ventricle and adjacent hippocampus (series 2, image 10). Vascular: No hyperdense vessel or unexpected calcification. Skull: Normal. Negative for fracture or focal lesion. Sinuses/Orbits: There is a depressed fracture of the floor of the right orbit, partially imaged, the fracture fragment measuring approximately 12 mm with approximately 4 mm depression (series 3, image 8, series 4, image 14). There is mild inferior rectus herniation. Partially imaged air-fluid level of the right maxillary sinus. Other: Soft tissue edema over the right eye. CT CERVICAL SPINE FINDINGS Alignment: Normal. Skull base and vertebrae: No acute fracture. No primary bone lesion or focal pathologic  process. Soft tissues and spinal canal: No prevertebral fluid or swelling. No visible canal hematoma. Disc levels: Moderate to severe multilevel disc space height loss and osteophytosis. Upper chest: Negative. Other: None. IMPRESSION: 1. There is minimal blood product in the medial right temporal lobe, which appears to be within the temporal horn of the right lateral ventricle and adjacent hippocampus (series 2, image 10). 2.  Small-vessel white matter disease. 3. There is a depressed fracture of the floor of the right orbit, partially imaged, the fracture fragment measuring approximately 12 mm with approximately 4 mm depression (series 3, image 8, series 4, image 14). There is mild inferior rectus herniation. Partially imaged air-fluid level of the right maxillary sinus. 4. No fracture or static subluxation of the cervical spine. Multilevel disc degenerative disease. These results were called by telephone at the time of interpretation on 11/25/2018 at 8:44 pm to Dr. Cherylann Banas , who verbally acknowledged these results. Electronically Signed   By: Eddie Candle M.D.   On: 11/25/2018 20:47   Ct Cervical Spine Wo Contrast  Result Date: 11/25/2018 CLINICAL DATA:  Fall, headache EXAM: CT HEAD WITHOUT CONTRAST CT CERVICAL SPINE WITHOUT CONTRAST TECHNIQUE: Multidetector CT imaging of the head and cervical spine was performed following the standard protocol without intravenous contrast. Multiplanar CT image reconstructions of the cervical spine were also generated. COMPARISON:  None. FINDINGS: CT HEAD FINDINGS Brain: There is minimal blood product in the medial right temporal lobe, which appears to be within the temporal horn of the right lateral ventricle and adjacent hippocampus (series 2, image 10). Vascular: No hyperdense vessel or unexpected calcification. Skull: Normal. Negative for fracture or focal lesion. Sinuses/Orbits: There is a depressed fracture of the floor of the right orbit, partially imaged, the fracture  fragment measuring approximately 12 mm with approximately 4 mm depression (series 3, image 8, series 4, image 14). There is mild inferior rectus herniation. Partially imaged air-fluid level of the right maxillary sinus. Other: Soft tissue edema over the right eye. CT CERVICAL SPINE FINDINGS Alignment: Normal. Skull base and vertebrae: No acute fracture. No primary bone lesion or focal pathologic process. Soft tissues and spinal canal: No prevertebral fluid or swelling. No visible canal hematoma. Disc levels: Moderate to severe multilevel disc space height loss and osteophytosis. Upper chest: Negative. Other: None. IMPRESSION: 1. There is minimal blood product in the medial right temporal lobe, which appears to be within the temporal horn of the right lateral ventricle and adjacent hippocampus (series 2, image 10). 2.  Small-vessel white matter disease. 3. There is a depressed fracture of the floor of the right orbit, partially imaged, the fracture fragment measuring approximately 12 mm with approximately 4 mm depression (series 3, image 8, series 4, image 14). There is mild inferior rectus herniation. Partially imaged air-fluid level of the right maxillary sinus. 4. No fracture or static subluxation of the cervical spine. Multilevel disc degenerative disease. These results were called by telephone at the time of interpretation on 11/25/2018 at 8:44 pm to Dr. Cherylann Banas , who verbally acknowledged these results. Electronically Signed   By: Eddie Candle M.D.   On: 11/25/2018 20:47   Dg Knee Complete 4 Views Right  Result Date: 11/25/2018 CLINICAL DATA:  Fall EXAM: RIGHT KNEE - COMPLETE 4+ VIEW COMPARISON:  None. FINDINGS: There is mild femorotibial and moderate patellofemoral osteoarthrosis. No acute fracture or dislocation. No knee effusion. IMPRESSION: No acute fracture or dislocation of the right knee. Mild-to-moderate tricompartmental osteoarthrosis. Electronically Signed   By: Ulyses Jarred M.D.   On: 11/25/2018  20:25   Ct Maxillofacial Wo Contrast  Result Date: 11/25/2018 CLINICAL DATA:  Fall with facial trauma EXAM: CT MAXILLOFACIAL WITHOUT CONTRAST TECHNIQUE: Multidetector CT imaging of the maxillofacial structures was performed. Multiplanar CT image reconstructions were also generated. COMPARISON:  None. FINDINGS: Osseous: --Complex facial fracture types: No LeFort, zygomaticomaxillary complex or nasoorbitoethmoidal fracture. --Simple fracture types: None. --Mandible, hard palate and teeth: No acute abnormality. Orbits: The globes are intact. Normal appearance of the intra- and extraconal fat. Symmetric extraocular muscles. Sinuses: No fluid levels or advanced mucosal thickening. Soft tissues: Small right periorbital hematoma. Limited intracranial: Normal. IMPRESSION: Small right periorbital hematoma without facial fracture. Electronically Signed   By: Ulyses Jarred M.D.   On: 11/25/2018 23:44    ASSESSMENT AND PLAN:   Active Problems:   Cerebral hemorrhage ([place])  1.  Intracranial hemorrhage: Small; follow-up CT shows no change in size.  Neurosurgery agrees to see the patient in consultation.  No neurologic deficits.  Control blood pressure.  Manage headache.  No anticoagulation. Monitor blood pressure closely and try to keep less than [id number] systolic. Frequent neuro checks. I explained patient in detail to report to the nurses if she notices any change in her vision, change in speech, tingling or numbness in her arms or legs. 2.  Hypertension: Controlled; continue ARB 3.  Diabetes mellitus type 2: Hold metformin for now.  Sliding scale insulin while hospitalized 4.  Hypothyroidism: Check TSH; continue Synthroid 5.  Falls: PT/OT evaluation for safety prior to discharge 6.  DVT prophylaxis: SCDs 7.  GI prophylaxis: PPI per home regimen   All the records are reviewed and case discussed with Care Management/Social Workerr. Management plans discussed with the patient, family and they are in  agreement.  CODE STATUS: Full.  TOTAL TIME TAKING CARE OF THIS PATIENT: 35 minutes.    POSSIBLE D/C IN 1 DAYS, DEPENDING ON CLINICAL CONDITION.   Vaughan Basta M.D on 11/26/2018   Between 7am to 6pm - Pager - [phone]  After 6pm go to [email or web address] - password [email or web address]  Office  [phone]  CC: Primary care physician; Venia Carbon, MD  Note: This dictation was prepared with Dragon dictation along with smaller phrase technology. Any transcriptional errors that result from this process are unintentional.

## 2018-11-26 NOTE — ED Notes (Signed)
ED TO INPATIENT HANDOFF REPORT  ED Nurse Name and Phone #: Barbie Haggis [phone]  S Name/Age/Gender Sandra Montgomery 78 y.o. female Room/Bed: ED05A/ED05A  Code Status   Code Status: Not on file  Home/SNF/Other Home Patient oriented to: self, place, person, time Is this baseline? Yes   Triage Complete: Triage complete  Chief Complaint fall  Triage Note Pt says she was out to dinner tonight and her shoe caught on the corner of her booth; pt says she fell to the right side; small laceration to right eyebrow with swelling present; c/o right rib pain and right knee pain; denies loss of consciousness; pt c/o headache; awake and alert; oriented x 3; talking in complete coherent sentences;    Allergies Allergies  Allergen Reactions  . Short Ragweed Pollen Ext   . Tape Rash    And peels the skin And peels the skin    Level of Care/Admitting Diagnosis ED Disposition    ED Disposition Condition [facility] Area: [place] [100120]  Level of Care: Med-Surg [16]  Covid Evaluation: Asymptomatic Screening Protocol (No Symptoms)  [facility]: [id number]  Admitting Physician: Harrie Foreman [id number]  Attending Physician: Harrie Foreman [phone]  PT Class (Do Not Modify): Observation [104]  PT Acc Code (Do Not Modify): Observation [10022]       B Medical/Surgery History Past Medical History:  Diagnosis Date  . Allergic rhinitis, cause unspecified   . Cancer (HCC)    shoulder carcinoma  . Diabetes mellitus without complication ([place])   . GERD (gastroesophageal reflux disease)   . History of blood transfusion   . Hyperlipidemia   . Hypertension   . Impaired fasting glucose   . Osteoarthrosis involving, or with mention of more than one site, but not specified as generalized, multiple sites   . Osteoporosis   . Pneumothorax 1965   prolonged bed rest   . Unspecified hypothyroidism    Past Surgical History:   Procedure Laterality Date  . ABDOMINAL HYSTERECTOMY  1981  . BREAST EXCISIONAL BIOPSY Left years ago   x 2. Negative.   Marland Kitchen BREAST SURGERY  1985   non cancerous mass removed  . CATARACT EXTRACTION W/PHACO Left 04/26/2017   Procedure: CATARACT EXTRACTION PHACO AND INTRAOCULAR LENS PLACEMENT ([place])  LEFT;  Surgeon: Leandrew Koyanagi, MD;  Location: [facility];  Service: Ophthalmology;  Laterality: Left;  Diabetic - oral meds  . CATARACT EXTRACTION W/PHACO Right 05/24/2017   Procedure: CATARACT EXTRACTION PHACO AND INTRAOCULAR LENS PLACEMENT ([place]) RIGHT DIABETIC;  Surgeon: Leandrew Koyanagi, MD;  Location: [facility];  Service: Ophthalmology;  Laterality: Right;  Diabetic - oral meds  . COLONOSCOPY    . COLONOSCOPY WITH PROPOFOL N/A 07/18/2016   Procedure: COLONOSCOPY WITH PROPOFOL;  Surgeon: Manya Silvas, MD;  Location[facility] ENDOSCOPY;  Service: Endoscopy;  Laterality: N/A;  . ESOPHAGOGASTRODUODENOSCOPY    . TONSILLECTOMY AND ADENOIDECTOMY  1961     A IV Location/Drains/Wounds Patient Lines/Drains/Airways Status   Active Line/Drains/Airways    Name:   Placement date:   Placement time:   Site:   Days:   Peripheral IV 11/25/18 Left Antecubital   11/25/18    1930    Antecubital   1   Incision (Closed) 04/26/17 Eye Left   04/26/17    0829     579   Incision (Closed) 04/26/17 Eye Left   04/26/17    0829     579  Incision (Closed) 05/24/17 Eye Right   05/24/17    0827     551          Intake/Output Last 24 hours  Intake/Output Summary (Last 24 hours) at 11/26/2018 0709 Last data filed at 11/26/2018 0602 Gross per 24 hour  Intake -  Output 300 ml  Net -300 ml    Labs/Imaging Results for orders placed or performed during the hospital encounter of 11/25/18 (from the past 48 hour(s))  Urinalysis, Complete w Microscopic     Status: Abnormal   Collection Time: 11/25/18  7:25 PM  Result Value Ref Range   Color, Urine YELLOW (A) YELLOW   APPearance HAZY (A) CLEAR    Specific Gravity, Urine 1.021 1.005 - 1.030   pH 5.0 5.0 - 8.0   Glucose, UA NEGATIVE NEGATIVE mg/dL   Hgb urine dipstick NEGATIVE NEGATIVE   Bilirubin Urine NEGATIVE NEGATIVE   Ketones, ur NEGATIVE NEGATIVE mg/dL   Protein, ur 30 (A) NEGATIVE mg/dL   Nitrite NEGATIVE NEGATIVE   Leukocytes,Ua SMALL (A) NEGATIVE   RBC / HPF 0-5 0 - 5 RBC/hpf   WBC, UA 0-5 0 - 5 WBC/hpf   Bacteria, UA NONE SEEN NONE SEEN   Squamous Epithelial / LPF 6-10 0 - 5   Mucus PRESENT     Comment: Performed at [facility], [address] [place], [place] [id number]  Basic metabolic panel     Status: Abnormal   Collection Time: 11/25/18  7:36 PM  Result Value Ref Range   Sodium 137 135 - 145 mmol/L   Potassium 3.9 3.5 - 5.1 mmol/L   Chloride 101 98 - 111 mmol/L   CO2 25 22 - 32 mmol/L   Glucose, Bld 111 (H) 70 - 99 mg/dL   BUN 25 (H) 8 - 23 mg/dL   Creatinine, Ser 1.11 (H) 0.44 - 1.00 mg/dL   Calcium 9.7 8.9 - 10.3 mg/dL   GFR calc non Af Amer 48 (L) >60 mL/min   GFR calc Af Amer 56 (L) >60 mL/min   Anion gap 11 5 - 15    Comment: Performed at [facility], [place], [place] [address]  Troponin I (High Sensitivity)     Status: None   Collection Time: 11/25/18  7:36 PM  Result Value Ref Range   Troponin I (High Sensitivity) 6 <18 ng/L    Comment: (NOTE) Elevated high sensitivity troponin I (hsTnI) values and significant  changes across serial measurements may suggest ACS but many other  chronic and acute conditions are known to elevate hsTnI results.  Refer to the "Links" section for chest pain algorithms and additional  guidance. Performed at [facility], [address] [place], [place] [address]   CBC with Differential     Status: Abnormal   Collection Time: 11/25/18  7:36 PM  Result Value Ref Range   WBC 10.4 4.0 - 10.5 K/uL   RBC 3.49 (L) 3.87 - 5.11 MIL/uL   Hemoglobin 8.9 (L) 12.0 - 15.0 g/dL   HCT 29.2 (L) 36.0 - 46.0 %   MCV 83.7 80.0 -  100.0 fL   MCH 25.5 (L) 26.0 - 34.0 pg   MCHC 30.5 30.0 - 36.0 g/dL   RDW 14.4 11.5 - 15.5 %   Platelets 372 150 - 400 K/uL   nRBC 0.0 0.0 - 0.2 %   Neutrophils Relative % 74 %   Neutro Abs 7.7 1.7 - 7.7 K/uL   Lymphocytes Relative  14 %   Lymphs Abs 1.5 0.7 - 4.0 K/uL   Monocytes Relative 8 %   Monocytes Absolute 0.8 0.1 - 1.0 K/uL   Eosinophils Relative 2 %   Eosinophils Absolute 0.3 0.0 - 0.5 K/uL   Basophils Relative 1 %   Basophils Absolute 0.1 0.0 - 0.1 K/uL   Immature Granulocytes 1 %   Abs Immature Granulocytes 0.06 0.00 - 0.07 K/uL    Comment: Performed at [facility], [place], [place] [address]  Troponin I (High Sensitivity)     Status: None   Collection Time: 11/26/18 12:08 AM  Result Value Ref Range   Troponin I (High Sensitivity) 5 <18 ng/L    Comment: (NOTE) Elevated high sensitivity troponin I (hsTnI) values and significant  changes across serial measurements may suggest ACS but many other  chronic and acute conditions are known to elevate hsTnI results.  Refer to the "Links" section for chest pain algorithms and additional  guidance. Performed at [facility], [address] [place], [place] [address]    Dg Ribs Unilateral W/chest Right  Result Date: 11/25/2018 CLINICAL DATA:  Fall with right-sided rib pain EXAM: RIGHT RIBS AND CHEST - 3+ VIEW COMPARISON:  None. FINDINGS: No fracture or other bone lesions are seen involving the ribs. There is no evidence of pneumothorax or pleural effusion. Both lungs are clear. Heart size and mediastinal contours are within normal limits. IMPRESSION: Negative. Electronically Signed   By: Ulyses Jarred M.D.   On: 11/25/2018 20:27   Ct Head Wo Contrast  Result Date: 11/26/2018 CLINICAL DATA:  78 year old female with follow-up of intracranial hemorrhage. EXAM: CT HEAD WITHOUT CONTRAST TECHNIQUE: Contiguous axial images were obtained from the base of the skull through the vertex without  intravenous contrast. COMPARISON:  Head CT dated 11/25/2018 FINDINGS: Brain: No significant change in the amount of bleed in the medial right temporal lobe likely in the region of the temporal horn of the right lateral ventricle or along the superior aspect of the epicanthus. No new bleed. There is mild age-related atrophy and chronic microvascular ischemic changes. No mass 3 effect or midline shift. Vascular: No hyperdense vessel or unexpected calcification. Skull: Normal. Negative for fracture or focal lesion. Sinuses/Orbits: No acute finding. Other: None IMPRESSION: 1. No significant change in the amount of bleed in the medial right temporal lobe. No new bleed. Continued follow-up recommended. 2. Mild age-related atrophy and chronic microvascular ischemic changes. Electronically Signed   By: Anner Crete M.D.   On: 11/26/2018 03:13   Ct Head Wo Contrast  Result Date: 11/25/2018 CLINICAL DATA:  Fall, headache EXAM: CT HEAD WITHOUT CONTRAST CT CERVICAL SPINE WITHOUT CONTRAST TECHNIQUE: Multidetector CT imaging of the head and cervical spine was performed following the standard protocol without intravenous contrast. Multiplanar CT image reconstructions of the cervical spine were also generated. COMPARISON:  None. FINDINGS: CT HEAD FINDINGS Brain: There is minimal blood product in the medial right temporal lobe, which appears to be within the temporal horn of the right lateral ventricle and adjacent hippocampus (series 2, image 10). Vascular: No hyperdense vessel or unexpected calcification. Skull: Normal. Negative for fracture or focal lesion. Sinuses/Orbits: There is a depressed fracture of the floor of the right orbit, partially imaged, the fracture fragment measuring approximately 12 mm with approximately 4 mm depression (series 3, image 8, series 4, image 14). There is mild inferior rectus herniation. Partially imaged air-fluid level of the right maxillary sinus. Other: Soft tissue edema over  the right  eye. CT CERVICAL SPINE FINDINGS Alignment: Normal. Skull base and vertebrae: No acute fracture. No primary bone lesion or focal pathologic process. Soft tissues and spinal canal: No prevertebral fluid or swelling. No visible canal hematoma. Disc levels: Moderate to severe multilevel disc space height loss and osteophytosis. Upper chest: Negative. Other: None. IMPRESSION: 1. There is minimal blood product in the medial right temporal lobe, which appears to be within the temporal horn of the right lateral ventricle and adjacent hippocampus (series 2, image 10). 2.  Small-vessel white matter disease. 3. There is a depressed fracture of the floor of the right orbit, partially imaged, the fracture fragment measuring approximately 12 mm with approximately 4 mm depression (series 3, image 8, series 4, image 14). There is mild inferior rectus herniation. Partially imaged air-fluid level of the right maxillary sinus. 4. No fracture or static subluxation of the cervical spine. Multilevel disc degenerative disease. These results were called by telephone at the time of interpretation on 11/25/2018 at 8:44 pm to Dr. Cherylann Banas , who verbally acknowledged these results. Electronically Signed   By: Eddie Candle M.D.   On: 11/25/2018 20:47   Ct Cervical Spine Wo Contrast  Result Date: 11/25/2018 CLINICAL DATA:  Fall, headache EXAM: CT HEAD WITHOUT CONTRAST CT CERVICAL SPINE WITHOUT CONTRAST TECHNIQUE: Multidetector CT imaging of the head and cervical spine was performed following the standard protocol without intravenous contrast. Multiplanar CT image reconstructions of the cervical spine were also generated. COMPARISON:  None. FINDINGS: CT HEAD FINDINGS Brain: There is minimal blood product in the medial right temporal lobe, which appears to be within the temporal horn of the right lateral ventricle and adjacent hippocampus (series 2, image 10). Vascular: No hyperdense vessel or unexpected calcification. Skull: Normal. Negative  for fracture or focal lesion. Sinuses/Orbits: There is a depressed fracture of the floor of the right orbit, partially imaged, the fracture fragment measuring approximately 12 mm with approximately 4 mm depression (series 3, image 8, series 4, image 14). There is mild inferior rectus herniation. Partially imaged air-fluid level of the right maxillary sinus. Other: Soft tissue edema over the right eye. CT CERVICAL SPINE FINDINGS Alignment: Normal. Skull base and vertebrae: No acute fracture. No primary bone lesion or focal pathologic process. Soft tissues and spinal canal: No prevertebral fluid or swelling. No visible canal hematoma. Disc levels: Moderate to severe multilevel disc space height loss and osteophytosis. Upper chest: Negative. Other: None. IMPRESSION: 1. There is minimal blood product in the medial right temporal lobe, which appears to be within the temporal horn of the right lateral ventricle and adjacent hippocampus (series 2, image 10). 2.  Small-vessel white matter disease. 3. There is a depressed fracture of the floor of the right orbit, partially imaged, the fracture fragment measuring approximately 12 mm with approximately 4 mm depression (series 3, image 8, series 4, image 14). There is mild inferior rectus herniation. Partially imaged air-fluid level of the right maxillary sinus. 4. No fracture or static subluxation of the cervical spine. Multilevel disc degenerative disease. These results were called by telephone at the time of interpretation on 11/25/2018 at 8:44 pm to Dr. Cherylann Banas , who verbally acknowledged these results. Electronically Signed   By: Eddie Candle M.D.   On: 11/25/2018 20:47   Dg Knee Complete 4 Views Right  Result Date: 11/25/2018 CLINICAL DATA:  Fall EXAM: RIGHT KNEE - COMPLETE 4+ VIEW COMPARISON:  None. FINDINGS: There is mild femorotibial and moderate patellofemoral osteoarthrosis. No acute fracture or  dislocation. No knee effusion. IMPRESSION: No acute fracture or  dislocation of the right knee. Mild-to-moderate tricompartmental osteoarthrosis. Electronically Signed   By: Ulyses Jarred M.D.   On: 11/25/2018 20:25   Ct Maxillofacial Wo Contrast  Result Date: 11/25/2018 CLINICAL DATA:  Fall with facial trauma EXAM: CT MAXILLOFACIAL WITHOUT CONTRAST TECHNIQUE: Multidetector CT imaging of the maxillofacial structures was performed. Multiplanar CT image reconstructions were also generated. COMPARISON:  None. FINDINGS: Osseous: --Complex facial fracture types: No [place], zygomaticomaxillary complex or nasoorbitoethmoidal fracture. --Simple fracture types: None. --Mandible, hard palate and teeth: No acute abnormality. Orbits: The globes are intact. Normal appearance of the intra- and extraconal fat. Symmetric extraocular muscles. Sinuses: No fluid levels or advanced mucosal thickening. Soft tissues: Small right periorbital hematoma. Limited intracranial: Normal. IMPRESSION: Small right periorbital hematoma without facial fracture. Electronically Signed   By: Ulyses Jarred M.D.   On: 11/25/2018 23:44    Pending Labs Unresulted Labs (From admission, onward)    Start     Ordered   11/26/18 0024  SARS CORONAVIRUS 2 Nasal Swab Aptima Multi Swab  (Asymptomatic/Tier 2 Patients Labs)  Once,   STAT    Question Answer Comment  Is this test for diagnosis or screening Screening   Symptomatic for COVID-19 as defined by CDC No   Hospitalized for COVID-19 No   Admitted to ICU for COVID-19 No   Previously tested for COVID-19 No   Resident in a congregate (group) care setting No   Employed in healthcare setting No   Pregnant No      11/26/18 0023   Signed and Held  TSH  Add-on,   R     Signed and Held   Signed and Held  Hemoglobin A1c  Add-on,   R     Signed and Held   Signed and Held  Hemoglobin A1c  Once,   R    Comments: To assess prior glycemic control    Signed and Held          Vitals/Pain Today's Vitals   11/26/18 0530 11/26/18 0600 11/26/18 0630 11/26/18  0700  BP: 105/60 (!) 134/97 137/65 117/68  Pulse: (!) 56 64 (!) 58 (!) 56  Resp: 13 14 12 12   Temp:      TempSrc:      SpO2: 94% 95% 95% 95%  Weight:      Height:      PainSc:        Isolation Precautions No active isolations  Medications Medications  ondansetron (ZOFRAN) injection 4 mg (4 mg Intravenous Given 11/25/18 1941)  acetaminophen (TYLENOL) tablet 1,000 mg (1,000 mg Oral Given 11/25/18 2059)    Mobility Pt is ambulatory but unknown if need device Moderate fall risk   Focused Assessments see previous assessment   R Recommendations: See Admitting Provider Note  Report given to:   Additional Notes:

## 2018-11-26 NOTE — Telephone Encounter (Signed)
Sandra Montgomery  Best number [phone]  [name] call to let you know Pt is in [facility] hospital 8/23  Watching a brain bleed to make sure it is not growing.  Bone under right eye is fractured.  She fell.  Blood work is showing she is anemic boardline  Room 126

## 2018-11-27 ENCOUNTER — Observation Stay: Payer: Medicare Other

## 2018-11-27 DIAGNOSIS — I7090 Unspecified atherosclerosis: Secondary | ICD-10-CM | POA: Diagnosis not present

## 2018-11-27 DIAGNOSIS — I619 Nontraumatic intracerebral hemorrhage, unspecified: Secondary | ICD-10-CM | POA: Diagnosis not present

## 2018-11-27 DIAGNOSIS — S0231XD Fracture of orbital floor, right side, subsequent encounter for fracture with routine healing: Secondary | ICD-10-CM | POA: Diagnosis not present

## 2018-11-27 DIAGNOSIS — E119 Type 2 diabetes mellitus without complications: Secondary | ICD-10-CM | POA: Diagnosis not present

## 2018-11-27 DIAGNOSIS — G319 Degenerative disease of nervous system, unspecified: Secondary | ICD-10-CM | POA: Diagnosis not present

## 2018-11-27 DIAGNOSIS — W19XXXA Unspecified fall, initial encounter: Secondary | ICD-10-CM | POA: Diagnosis not present

## 2018-11-27 DIAGNOSIS — S06300A Unspecified focal traumatic brain injury without loss of consciousness, initial encounter: Secondary | ICD-10-CM | POA: Diagnosis not present

## 2018-11-27 DIAGNOSIS — I10 Essential (primary) hypertension: Secondary | ICD-10-CM | POA: Diagnosis not present

## 2018-11-27 DIAGNOSIS — I6782 Cerebral ischemia: Secondary | ICD-10-CM | POA: Diagnosis not present

## 2018-11-27 LAB — GLUCOSE, CAPILLARY
Glucose-Capillary: 90 mg/dL (ref 70–99)
Glucose-Capillary: 113 mg/dL — ABNORMAL HIGH (ref 70–99)

## 2018-11-27 MED ORDER — OXYCODONE-ACETAMINOPHEN 5-325 MG PO TABS: 1.0000 | ORAL_TABLET | Freq: Four times a day (QID) | ORAL | 0 refills | Status: DC | PRN

## 2018-11-27 NOTE — Discharge Instructions (Signed)
Do not lift any heavy weights and avoid exertion.  Regular ambulation is fine.   Follow-up with your family physician and neurosurgery within a week

## 2018-11-27 NOTE — Evaluation (Signed)
Physical Therapy Evaluation Patient Details Name: Sandra Montgomery MRN: [id number] DOB: 1940-10-29 Today's Date: 11/27/2018   History of Present Illness  Pt is a 78 y.o. female presenting to hospital 11/25/18 s/p fall and head injury after catching shoe on corner of booth at restaurant; pt with HA and R rib/elbow/knee pain.  Imaging positive for depressed fx floor of R orbit and mild inferior rectus herniation; also small intracranial hemorrhage (3rd CT of head showing small amount of blood in R occipital horn unchanged).  PMH includes CA, DM, htn, CKD, pneumothorax, and breast surgery.  Clinical Impression  Prior to hospital admission, pt was independent.  Pt lives alone but plans to discharge to her daughter's home today and then will go to her friends home tomorrow and pt reports she will have assist as needed.  Currently pt is minimal assist semi-supine to sit; CGA with transfers; CGA with ambulation; and CGA navigating 3 steps with railing.  Pt initially unsteady ambulating without UE support (CGA for safety) but improved balance and gait noted with trial of RW; pt's gait continued to improve and switched pt back to no AD with ambulation and pt appearing steady (without any UE support ambulating).  Pt reporting R sided rib pain mostly with bed mobility but otherwise with minimal pain rest of session.  Discussed using rollator for community/outside activities for balance for safety and pt verbalizing appropriate understanding (pt reports having rollator at home already).  Educated pt on pacing and activity modification and pt verbalizing appropriate understanding  Pt would benefit from skilled PT to address noted impairments and functional limitations (see below for any additional details).  Upon hospital discharge, recommend pt discharge with OP PT and initial supervision with functional mobility for safety (pt reports she will have the assist she needs upon hospital discharge).    Follow Up  Recommendations Outpatient PT;Supervision for mobility/OOB    Equipment Recommendations  None recommended by PT    Recommendations for Other Services       Precautions / Restrictions Precautions Precautions: Fall Restrictions Weight Bearing Restrictions: No      Mobility  Bed Mobility Overal bed mobility: Needs Assistance Bed Mobility: Supine to Sit     Supine to sit: Min assist     General bed mobility comments: bed flat; increased time to perform; vc's to roll onto L side and push up with L elbow; minimal assist for trunk  Transfers Overall transfer level: Needs assistance Equipment used: None Transfers: Sit to/from [facility] Sit to Stand: Supervision Stand pivot transfers: Min guard(stand step turn bed to recliner)       General transfer comment: strong stand from bed x1 trial and from recliner x2 trials  Ambulation/Gait Ambulation/Gait assistance: Min guard Gait Distance (Feet): (40 feet no AD; 140 feet RW; 150 feet no AD) Assistive device: None;Rolling walker (2 wheeled)   Gait velocity: normal to slightly increased gait speed   General Gait Details: pt initially unsteady ambulating 40 feet in room with decreased B step length (CGA for safety) so trialed RW (pt reported she would prefer to use walker over cane trial); improved balance and step through gait noted with RW; switched back to no AD and improved step through gait noted and pt appearing steady with ambulation  Stairs Stairs: Yes Stairs assistance: Min guard Stair Management: One rail Right;Forwards Number of Stairs: 3 General stair comments: alternating stepping pattern ascending steps; step to pattern descending steps; steady with UE support on railing  Wheelchair Mobility  Modified Rankin (Stroke Patients Only)       Balance Overall balance assessment: Needs assistance Sitting-balance support: No upper extremity supported;Feet supported Sitting balance-Leahy Scale:  Normal Sitting balance - Comments: steady sitting reaching outside BOS   Standing balance support: No upper extremity supported Standing balance-Leahy Scale: Good Standing balance comment: steady standing reaching within BOS                             Pertinent Vitals/Pain Pain Assessment: 0-10 Pain Score: 2  Pain Location: R rib pain Pain Descriptors / Indicators: Tender;Sore Pain Intervention(s): Limited activity within patient's tolerance;Monitored during session;Repositioned  BP 139/75 semi-supine in bed at rest beginning of session with HR 64 bpm; BP increased to 192/97 post activity with HR 78 bpm sitting in chair; after 5 minutes of sitting rest break pt's BP decreased to 150/75 (nurse notified of pt's vitals).    Home Living Family/patient expects to be discharged to:: Private residence Living Arrangements: Alone Available Help at Discharge: Family Type of Home: House Home Access: Ramped entrance;Stairs to enter   Entrance Stairs-Number of Steps: ramp to enter daughter's home; 3 steps with B railings to enter friends home Home Layout: One [place]: [place] - 4 wheels;Cane - single point Additional Comments: Daughter lives next door; son lives close by; and also has friend who can assist.    Prior Function Level of Independence: Independent         Comments: No other falls in past 6 months.  Husband is at ALF.     Hand Dominance        Extremity/Trunk Assessment   Upper Extremity Assessment Upper Extremity Assessment: RUE deficits/detail;LUE deficits/detail RUE Deficits / Details: performing R UE AROM shoulder flexion caused pain at R side of trunk limiting ROM; elbow flexion/extension and hand grip WFL LUE Deficits / Details: strength and ROM WFL    Lower Extremity Assessment Lower Extremity Assessment: RLE deficits/detail;LLE deficits/detail RLE Deficits / Details: hip flexion at least 3/5; knee flexion/extension 4/5; DF 4/5 LLE  Deficits / Details: hip flexion at least 3/5; knee flexion/extension 4/5; DF 4/5       Communication   Communication: No difficulties  Cognition Arousal/Alertness: Awake/alert Behavior During Therapy: WFL for tasks assessed/performed Overall Cognitive Status: Within Functional Limits for tasks assessed                                        General Comments General comments (skin integrity, edema, etc.): bruising noted to pt's face.  Pt agreeable to PT session.    Exercises     Assessment/Plan    PT Assessment Patient needs continued PT services  PT Problem List Decreased strength;Decreased balance;Decreased mobility;Decreased knowledge of use of DME;Decreased knowledge of precautions;Pain       PT Treatment Interventions DME instruction;Gait training;Stair training;Functional mobility training;Therapeutic activities;Therapeutic exercise;Balance training;Patient/family education    PT Goals (Current goals can be found in the Care Plan section)  Acute Rehab PT Goals Patient Stated Goal: to improve walking PT Goal Formulation: With patient Time For Goal Achievement: 12/11/18 Potential to Achieve Goals: Good    Frequency 7X/week   Barriers to discharge        Co-evaluation               AM-PAC PT "6 Clicks" Mobility  Outcome Measure Help needed turning from  your back to your side while in a flat bed without using bedrails?: None Help needed moving from lying on your back to sitting on the side of a flat bed without using bedrails?: A Little Help needed moving to and from a bed to a chair (including a wheelchair)?: A Little Help needed standing up from a chair using your arms (e.g., wheelchair or bedside chair)?: A Little Help needed to walk in hospital room?: A Little Help needed climbing 3-5 steps with a railing? : A Little 6 Click Score: 19    End of Session Equipment Utilized During Treatment: Gait belt Activity Tolerance: Patient tolerated  treatment well Patient left: in chair;with call bell/phone within reach;with chair alarm set;with SCD's reapplied Nurse Communication: Mobility status;Precautions;Other (comment)(pt's BP during session) PT Visit Diagnosis: Unsteadiness on feet (R26.81);Muscle weakness (generalized) (M62.81);History of falling (Z91.81);Difficulty in walking, not elsewhere classified (R26.2)    Time: [id number] PT Time Calculation (min) (ACUTE ONLY): 43 min   Charges:   PT Evaluation $PT Eval Low Complexity: 1 Low PT Treatments $Gait Training: 23-37 mins       Leitha Bleak, PT 11/27/18, 3:51 PM [phone]

## 2018-11-27 NOTE — Progress Notes (Signed)
Initial Nutrition Assessment  RD working remotely.  DOCUMENTATION CODES:   Not applicable  INTERVENTION:  Patient is eating well during admission and will not require an intervention.  Encouraged intake of well-balanced meals at home. Discussed patient can drink an oral nutrition supplement between meals if she is not eating well and experiencing unintentional weight loss.  NUTRITION DIAGNOSIS:   Inadequate oral intake(prior to admission) related to social / environmental circumstances(laid off from work due to [employer], does not like to eat alone) as evidenced by per patient/family report.  GOAL:   Patient will meet greater than or equal to 90% of their needs  MONITOR:   PO intake, Labs, Weight trends, I & O's  REASON FOR ASSESSMENT:   Malnutrition Screening Tool    ASSESSMENT:   78 year old female with PMHx of HTN, HLD, hypothyroidism, OA, GERD, OP admitted with small intracranial hemorrhage.   Spoke with patient over the phone. She reports her appetite and intake are very good in the hospital. She is eating 100% of meals. She reports that since March she has not been eating as well at home because she got laid off from work due to [employer]. Her husband is in a SNF and she does not like to eat alone. She used to eat 2 well-balanced meals per day when she was working. She reports her intake is varied now but she is taking in much less than she used to. Patient reports she does not have diabetes. Per review of HgbA1c in chart it was 6.3% on 03/05/2015, 6.3% on 03/07/2016, 6.5% on 03/20/2017, 7.1% on 03/29/2018, and 5.5% on 11/26/2018. Encouraged patient to discuss with her PCP. It is possible that her weight loss (though unintentional) improved peripheral insulin sensitivity and therefore her HgbA1c.  Patient reports she has lost about 30 lbs unintentionally since March 2020. Per chart she was 81.2 kg on 05/03/2018 and there is a gap in weight history after that. On admission she was 74.8 kg  (164.9 lbs). However, weight taken this morning was 88.9 kg, so unsure which one is accurate and RD is working remotely. If admission weight is accurate she has lost 6.4 kg (7.9% body weight) some time over the past 7 months (5 months per pt report), which is not significant for time frame. Patient reports she has noticeable muscle loss.  Medications reviewed and include: Colace 100 mg BID, vitamin D3 1000 units daily, Novolog 0-9 units TID, Novolog 0-5 units QHS, levothyroxine, pantoprazole.  Labs reviewed.  NUTRITION - FOCUSED PHYSICAL EXAM:  Unable to complete at this time.  Diet Order:   Diet Order            Diet heart healthy/carb modified Room service appropriate? Yes; Fluid consistency: Thin  Diet effective now             EDUCATION NEEDS:   Education needs have been addressed  Skin:  Skin Assessment: Reviewed RN Assessment  Last BM:  Unknown  Height:   Ht Readings from Last 1 Encounters:  11/26/18 5\' 5"  (1.651 m)   Weight:   Wt Readings from Last 1 Encounters:  11/27/18 88.9 kg   Ideal Body Weight:  56.8 kg  BMI:  Body mass index is 32.62 kg/m.  Estimated Nutritional Needs:   Kcal:  1500-1700  Protein:  75-85 grams  Fluid:  1.5-1.7 L/day  Willey Blade, MS, RD, LDN Office: [phone] Pager: [phone] After Hours/Weekend Pager: [phone]

## 2018-11-27 NOTE — Progress Notes (Signed)
Pt eyeglasses located and returned

## 2018-11-27 NOTE — Progress Notes (Signed)
Discharge instructions given and went over with patient at bedside. All questions answered. Patient discharged home. [name] S, RN  

## 2018-11-27 NOTE — Care Management Obs Status (Signed)
[facility] NOTIFICATION   Patient Details  Name: Sandra Montgomery MRN: [id number] Date of Birth: November 24, 1940   Medicare Observation Status Notification Given:  Yes    Dontrelle Mazon, Veronia Beets, LCSW 11/27/2018, 11:04 AM
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
## 2018-11-28 ENCOUNTER — Telehealth: Payer: Self-pay
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
## 2018-11-30 ENCOUNTER — Telehealth: Payer: Self-pay

## 2018-11-30 NOTE — Telephone Encounter (Signed)
Lavella Lemons is with pt at Mrs [facility] home and pt gave verbal permission to speak with Mrs Judeth Horn. Pt went to get hair done this morning; pt last drank anything at 9:30 AM today. At 11AM pt vomited in a row x 3 with no nausea and no warning. Then pt felt flushed like a hot flash for a while. Rt side of face is still numb.pt has pain in rt ribs upon movement. No abd pain. Pt has been sitting in a lift chair for 2-3 hrs and resting and now feels OK but is concerned about the earlier episode. Pt recently in hospital with a small brain bleed. Pt has appt to see neurologist on 12/06/18 at [facility]. Pt said will go to [facility] to see if can understand why vomited x 3 without any prior symptoms. FYI to Dr Silvio Pate.

## 2018-12-01 NOTE — Telephone Encounter (Signed)
Phone call with her today Didn't seek care--feels some better today Discussed that her vomiting was not surprising after her brain injury and then getting her hair washed (positional, etc)  Discussed taking it easy for a while Will see her at her hospital follow up

## 2018-12-05 ENCOUNTER — Encounter: Payer: Self-pay | Admitting: Internal Medicine

## 2018-12-05 ENCOUNTER — Other Ambulatory Visit: Payer: Self-pay

## 2018-12-05 ENCOUNTER — Ambulatory Visit (INDEPENDENT_AMBULATORY_CARE_PROVIDER_SITE_OTHER): Payer: Medicare Other | Admitting: Internal Medicine

## 2018-12-05 VITALS — BP 132/84 | HR 72 | Temp 98.1°F | Ht 64.0 in | Wt 152.0 lb

## 2018-12-05 DIAGNOSIS — Z23 Encounter for immunization: Secondary | ICD-10-CM | POA: Diagnosis not present

## 2018-12-05 DIAGNOSIS — R42 Dizziness and giddiness: Secondary | ICD-10-CM | POA: Diagnosis not present

## 2018-12-05 DIAGNOSIS — R7303 Prediabetes: Secondary | ICD-10-CM

## 2018-12-05 DIAGNOSIS — I619 Nontraumatic intracerebral hemorrhage, unspecified: Secondary | ICD-10-CM

## 2018-12-05 DIAGNOSIS — S0285XD Fracture of orbit, unspecified, subsequent encounter for fracture with routine healing: Secondary | ICD-10-CM

## 2018-12-05 DIAGNOSIS — S0285XA Fracture of orbit, unspecified, initial encounter for closed fracture: Secondary | ICD-10-CM | POA: Insufficient documentation

## 2018-12-05 NOTE — Assessment & Plan Note (Signed)
Still tender but not displaced No eye problems

## 2018-12-05 NOTE — Assessment & Plan Note (Signed)
Mild No intervention was needed Reassuring neuro exam now Going for follow up to neurosurgeon tomorrow
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
## 2018-12-05 NOTE — Assessment & Plan Note (Signed)
Started before the fall--and didn't seem to cause it No issues now It would be good for the planned outpatient PT evaluation though

## 2018-12-05 NOTE — Patient Instructions (Signed)
If you don't hear from [facility] physical therapy in the next couple of days, let me know and we can contact them.

## 2018-12-05 NOTE — Assessment & Plan Note (Signed)
Has lost considerable weight Will try off the metformin

## 2018-12-06 DIAGNOSIS — S0285XD Fracture of orbit, unspecified, subsequent encounter for fracture with routine healing: Secondary | ICD-10-CM | POA: Diagnosis not present

## 2018-12-12 ENCOUNTER — Ambulatory Visit: Payer: Medicare Other | Admitting: Internal Medicine

## 2018-12-13 NOTE — Discharge Summary (Signed)
[facility] [place] NAME: Sandra Montgomery    MR#:  [id number]  DATE OF BIRTH:  Jul 19, 1940  DATE OF ADMISSION:  11/25/2018 ADMITTING PHYSICIAN: Harrie Foreman, MD  DATE OF DISCHARGE: 11/27/2018  4:00 PM  PRIMARY CARE PHYSICIAN: Venia Carbon, MD   ADMISSION DIAGNOSIS:  Traumatic intracranial hemorrhage with loss of consciousness of 30 minutes or less, initial encounter ([place]) [S06.301A] Traumatic injury of head, initial encounter [S09.90XA] Syncope, unspecified syncope type [R55] Anemia, unspecified type [D64.9]  DISCHARGE DIAGNOSIS:  Active Problems:   Cerebral hemorrhage ([place])   SECONDARY DIAGNOSIS:   Past Medical History:  Diagnosis Date  . Allergic rhinitis, cause unspecified   . Cancer (HCC)    shoulder carcinoma  . Diabetes mellitus without complication ([place])   . GERD (gastroesophageal reflux disease)   . History of blood transfusion   . Hyperlipidemia   . Hypertension   . Impaired fasting glucose   . Osteoarthrosis involving, or with mention of more than one site, but not specified as generalized, multiple sites   . Osteoporosis   . Pneumothorax 1965   prolonged bed rest   . Unspecified hypothyroidism    ADMITTING HISTORY  HPI: The patient with past medical history of hypertension, diabetes and hyperlipidemia presents to the emergency department following a fall.  The patient tripped and fell earlier this evening and bumped the right side of her head.  The patient developed a headache but denied loss of consciousness.  She also denies nausea or vomiting.  The patient denies dizziness at this time although she admits to feeling some vertigo earlier this week.  CT of her head revealed a small bleed of the medial right temporal lobe.  Neurosurgery was consulted prior to the emergency department staff calling the hospitalist service for admission.   HOSPITAL COURSE:   1. Intracranial hemorrhage: Small; follow-up CT  shows no change in size. Neurosurgery consulted.  Advised discharge home with stable CT scan.. No neurologic deficits.  No anticoagulation.  Mild headache prior to discharge Blood pressure well controlled Stable neuro checks.  2. Hypertension: Controlled; continue ARB  3. Diabetes mellitus type 2: Hold metformin for now. Sliding scale insulin while hospitalized  4. Hypothyroidism: Synthroid continued  5. Falls: PT evaluation prior to discharge.  Home health set up.  Stable for discharge home  CONSULTS OBTAINED:    Physical therapy  DRUG ALLERGIES:   Allergies  Allergen Reactions  . Short Ragweed Pollen Ext   . Tape Rash    And peels the skin And peels the skin    DISCHARGE MEDICATIONS:   Allergies as of 11/27/2018      Reactions   Short Ragweed Pollen Ext    Tape Rash   And peels the skin And peels the skin      Medication List    TAKE these medications   atorvastatin 80 MG tablet Commonly known as: LIPITOR 1 by mouth daily   Biotin 1000 MCG tablet Take 1 mg by mouth 3 (three) times daily.   D3-1000 25 MCG (1000 UT) capsule Generic drug: Cholecalciferol Take 1,000 Units by mouth daily.   levothyroxine 75 MCG tablet Commonly known as: SYNTHROID Take 1 tablet (75 mcg total) by mouth daily.   naproxen sodium 220 MG tablet Commonly known as: ALEVE Take 220 mg by mouth 2 (two) times daily with a meal.   olmesartan 40 MG tablet Commonly known as: BENICAR Take 1 tablet (40 mg total) by  mouth daily.   omeprazole 40 MG capsule Commonly known as: PRILOSEC Take 1 capsule (40 mg total) by mouth daily.       Today   VITAL SIGNS:  Blood pressure 125/66, pulse 60, temperature 97.8 F (36.6 C), temperature source Oral, resp. rate 15, height 5\' 5"  (1.651 m), weight 88.9 kg, SpO2 98 %.  I/O:  No intake or output data in the 24 hours ending 12/13/18 1506  PHYSICAL EXAMINATION:  Physical Exam  GENERAL:  78 y.o.-year-old patient lying in the bed  with no acute distress.  LUNGS: Normal breath sounds bilaterally, no wheezing, rales,rhonchi or crepitation. No use of accessory muscles of respiration.  CARDIOVASCULAR: S1, S2 normal. No murmurs, rubs, or gallops.  ABDOMEN: Soft, non-tender, non-distended. Bowel sounds present. No organomegaly or mass.  NEUROLOGIC: Moves all 4 extremities. PSYCHIATRIC: The patient is alert and oriented x 3.  SKIN: No obvious rash, lesion, or ulcer.   DATA REVIEW:   CBC No results for input(s): WBC, HGB, HCT, PLT in the last 168 hours.  Chemistries  No results for input(s): NA, K, CL, CO2, GLUCOSE, BUN, CREATININE, CALCIUM, MG, AST, ALT, ALKPHOS, BILITOT in the last 168 hours.  Invalid input(s): GFRCGP  Cardiac Enzymes No results for input(s): TROPONINI in the last 168 hours.  Microbiology Results  Results for orders placed or performed during the hospital encounter of 11/25/18  SARS CORONAVIRUS 2 Nasal Swab Aptima Multi Swab     Status: None   Collection Time: 11/26/18 12:34 AM   Specimen: Aptima Multi Swab; Nasal Swab  Result Value Ref Range Status   SARS Coronavirus 2 NEGATIVE NEGATIVE Final    Comment: (NOTE) SARS-CoV-2 target nucleic acids are NOT DETECTED. The SARS-CoV-2 RNA is generally detectable in upper and lower respiratory specimens during the acute phase of infection. Negative results do not preclude SARS-CoV-2 infection, do not rule out co-infections with other pathogens, and should not be used as the sole basis for treatment or other patient management decisions. Negative results must be combined with clinical observations, patient history, and epidemiological information. The expected result is Negative. Fact Sheet for Patients: SugarRoll.be Fact Sheet for Healthcare Providers: https://www.woods-mathews.com/ This test is not yet approved or cleared by the [place] FDA and  has been authorized for detection and/or diagnosis of  SARS-CoV-2 by FDA under an Emergency Use Authorization (EUA). This EUA will remain  in effect (meaning this test can be used) for the duration of the COVID-19 declaration under Section 56 4(b)(1) of the Act, 21 U.S.C. section 360bbb-3(b)(1), unless the authorization is terminated or revoked sooner. Performed at [facility], [place] [address] [place], [place] [address]     RADIOLOGY:  No results found.  Follow up with PCP in 1 week.  Management plans discussed with the patient, family and they are in agreement.  CODE STATUS:  Code Status History    Date Active Date Inactive Code Status Order ID Comments User Context   11/26/2018 0739 11/27/2018 1954 Full Code [id number]  Harrie Foreman, MD ED   Advance Care Planning Activity    Advance Directive Documentation     Most Recent Value  Type of Advance Directive  Healthcare Power of Attorney, Living will  Pre-existing out of facility DNR order (yellow form or pink MOST form)  -  "MOST" Form in Place?  -      TOTAL TIME TAKING CARE OF THIS PATIENT ON DAY OF DISCHARGE: more than 30 minutes.   Kazmir Oki R Kendalynn Wideman  M.D on 12/13/2018 at 3:06 PM  Between 7am to 6pm - Pager - [phone]  After 6pm go to [email or web address] - password [email or web address] [facility]  Office  [phone]  CC: Primary care physician; Venia Carbon, MD  Note: This dictation was prepared with Dragon dictation along with smaller phrase technology. Any transcriptional errors that result from this process are unintentional.

## 2019-01-04 ENCOUNTER — Telehealth: Payer: Self-pay | Admitting: Internal Medicine

## 2019-01-04 NOTE — Telephone Encounter (Signed)
LMOM home#.  Reached pt on her cell.  She was driving.  She will listen to the message I left on home# that states the phone# for [facility] and call them on Monday.  She has not received any calls/messages from [facility].  She called hospital last week and tried to get through to the correct department but never got to them.

## 2019-01-18 ENCOUNTER — Other Ambulatory Visit: Payer: Self-pay

## 2019-01-18 ENCOUNTER — Observation Stay (HOSPITAL_COMMUNITY)
Admission: EM | Admit: 2019-01-18 | Discharge: 2019-01-19 | Disposition: A | Discharge status: Home / Self Care | Payer: Medicare Other | Attending: Internal Medicine | Admitting: Internal Medicine

## 2019-01-18 ENCOUNTER — Observation Stay (HOSPITAL_COMMUNITY): Payer: Medicare Other

## 2019-01-18 ENCOUNTER — Emergency Department (HOSPITAL_COMMUNITY): Payer: Medicare Other

## 2019-01-18 DIAGNOSIS — E039 Hypothyroidism, unspecified: Secondary | ICD-10-CM

## 2019-01-18 DIAGNOSIS — D649 Anemia, unspecified: Secondary | ICD-10-CM | POA: Diagnosis not present

## 2019-01-18 DIAGNOSIS — D509 Iron deficiency anemia, unspecified: Secondary | ICD-10-CM

## 2019-01-18 DIAGNOSIS — Z7984 Long term (current) use of oral hypoglycemic drugs: Secondary | ICD-10-CM | POA: Insufficient documentation

## 2019-01-18 DIAGNOSIS — R55 Syncope and collapse: Secondary | ICD-10-CM | POA: Diagnosis not present

## 2019-01-18 DIAGNOSIS — K219 Gastro-esophageal reflux disease without esophagitis: Secondary | ICD-10-CM

## 2019-01-18 DIAGNOSIS — Y999 Unspecified external cause status: Secondary | ICD-10-CM | POA: Diagnosis not present

## 2019-01-18 DIAGNOSIS — R102 Pelvic and perineal pain: Secondary | ICD-10-CM | POA: Diagnosis not present

## 2019-01-18 DIAGNOSIS — R197 Diarrhea, unspecified: Secondary | ICD-10-CM

## 2019-01-18 DIAGNOSIS — Z9181 History of falling: Secondary | ICD-10-CM | POA: Diagnosis not present

## 2019-01-18 DIAGNOSIS — Z03818 Encounter for observation for suspected exposure to other biological agents ruled out: Secondary | ICD-10-CM | POA: Diagnosis not present

## 2019-01-18 DIAGNOSIS — D696 Thrombocytopenia, unspecified: Secondary | ICD-10-CM | POA: Diagnosis not present

## 2019-01-18 DIAGNOSIS — Z20828 Contact with and (suspected) exposure to other viral communicable diseases: Secondary | ICD-10-CM | POA: Diagnosis not present

## 2019-01-18 DIAGNOSIS — S3219XA Other fracture of sacrum, initial encounter for closed fracture: Secondary | ICD-10-CM | POA: Diagnosis not present

## 2019-01-18 DIAGNOSIS — M7918 Myalgia, other site: Secondary | ICD-10-CM | POA: Diagnosis not present

## 2019-01-18 DIAGNOSIS — Y9259 Other trade areas as the place of occurrence of the external cause: Secondary | ICD-10-CM | POA: Diagnosis not present

## 2019-01-18 DIAGNOSIS — E1122 Type 2 diabetes mellitus with diabetic chronic kidney disease: Secondary | ICD-10-CM | POA: Insufficient documentation

## 2019-01-18 DIAGNOSIS — R110 Nausea: Secondary | ICD-10-CM | POA: Diagnosis not present

## 2019-01-18 DIAGNOSIS — W1839XA Other fall on same level, initial encounter: Secondary | ICD-10-CM | POA: Insufficient documentation

## 2019-01-18 DIAGNOSIS — Z79899 Other long term (current) drug therapy: Secondary | ICD-10-CM | POA: Diagnosis not present

## 2019-01-18 DIAGNOSIS — R1115 Cyclical vomiting syndrome unrelated to migraine: Secondary | ICD-10-CM

## 2019-01-18 DIAGNOSIS — I10 Essential (primary) hypertension: Secondary | ICD-10-CM | POA: Diagnosis present

## 2019-01-18 DIAGNOSIS — Z8673 Personal history of transient ischemic attack (TIA), and cerebral infarction without residual deficits: Secondary | ICD-10-CM | POA: Insufficient documentation

## 2019-01-18 DIAGNOSIS — S32121A Minimally displaced Zone II fracture of sacrum, initial encounter for closed fracture: Secondary | ICD-10-CM

## 2019-01-18 DIAGNOSIS — I129 Hypertensive chronic kidney disease with stage 1 through stage 4 chronic kidney disease, or unspecified chronic kidney disease: Secondary | ICD-10-CM | POA: Insufficient documentation

## 2019-01-18 DIAGNOSIS — Y9389 Activity, other specified: Secondary | ICD-10-CM | POA: Diagnosis not present

## 2019-01-18 DIAGNOSIS — N1831 Chronic kidney disease, stage 3a: Secondary | ICD-10-CM | POA: Diagnosis present

## 2019-01-18 DIAGNOSIS — N183 Chronic kidney disease, stage 3 unspecified: Secondary | ICD-10-CM | POA: Diagnosis not present

## 2019-01-18 DIAGNOSIS — I959 Hypotension, unspecified: Secondary | ICD-10-CM | POA: Diagnosis not present

## 2019-01-18 DIAGNOSIS — E785 Hyperlipidemia, unspecified: Secondary | ICD-10-CM | POA: Diagnosis not present

## 2019-01-18 DIAGNOSIS — R112 Nausea with vomiting, unspecified: Secondary | ICD-10-CM | POA: Diagnosis present

## 2019-01-18 DIAGNOSIS — S3993XA Unspecified injury of pelvis, initial encounter: Secondary | ICD-10-CM | POA: Diagnosis not present

## 2019-01-18 DIAGNOSIS — I619 Nontraumatic intracerebral hemorrhage, unspecified: Secondary | ICD-10-CM

## 2019-01-18 DIAGNOSIS — S3210XA Unspecified fracture of sacrum, initial encounter for closed fracture: Secondary | ICD-10-CM | POA: Diagnosis not present

## 2019-01-18 DIAGNOSIS — R0902 Hypoxemia: Secondary | ICD-10-CM | POA: Diagnosis not present

## 2019-01-18 DIAGNOSIS — N1832 Chronic kidney disease, stage 3b: Secondary | ICD-10-CM | POA: Diagnosis present

## 2019-01-18 DIAGNOSIS — J80 Acute respiratory distress syndrome: Secondary | ICD-10-CM | POA: Diagnosis not present

## 2019-01-18 DIAGNOSIS — M533 Sacrococcygeal disorders, not elsewhere classified: Secondary | ICD-10-CM | POA: Diagnosis not present

## 2019-01-18 LAB — CBC WITH DIFFERENTIAL/PLATELET
Abs Immature Granulocytes: 0.02 10*3/uL (ref 0.00–0.07)
Basophils Absolute: 0 10*3/uL (ref 0.0–0.1)
Basophils Relative: 1 %
Eosinophils Absolute: 0.1 10*3/uL (ref 0.0–0.5)
Eosinophils Relative: 1 %
HCT: 28.5 % — ABNORMAL LOW (ref 36.0–46.0)
Hemoglobin: 8.2 g/dL — ABNORMAL LOW (ref 12.0–15.0)
Immature Granulocytes: 0 %
Lymphocytes Relative: 12 %
Lymphs Abs: 1.1 10*3/uL (ref 0.7–4.0)
MCH: 22.8 pg — ABNORMAL LOW (ref 26.0–34.0)
MCHC: 28.8 g/dL — ABNORMAL LOW (ref 30.0–36.0)
MCV: 79.2 fL — ABNORMAL LOW (ref 80.0–100.0)
Monocytes Absolute: 0.6 10*3/uL (ref 0.1–1.0)
Monocytes Relative: 7 %
Neutro Abs: 6.8 10*3/uL (ref 1.7–7.7)
Neutrophils Relative %: 79 %
Platelets: 106 10*3/uL — ABNORMAL LOW (ref 150–400)
RBC: 3.6 MIL/uL — ABNORMAL LOW (ref 3.87–5.11)
RDW: 15.5 % (ref 11.5–15.5)
WBC: 8.6 10*3/uL (ref 4.0–10.5)
nRBC: 0 % (ref 0.0–0.2)

## 2019-01-18 LAB — COMPREHENSIVE METABOLIC PANEL
ALT: 15 U/L (ref 0–44)
AST: 21 U/L (ref 15–41)
Albumin: 3.9 g/dL (ref 3.5–5.0)
Alkaline Phosphatase: 68 U/L (ref 38–126)
Anion gap: 12 (ref 5–15)
BUN: 19 mg/dL (ref 8–23)
CO2: 21 mmol/L — ABNORMAL LOW (ref 22–32)
Calcium: 9.2 mg/dL (ref 8.9–10.3)
Chloride: 106 mmol/L (ref 98–111)
Creatinine, Ser: 1.1 mg/dL — ABNORMAL HIGH (ref 0.44–1.00)
GFR calc Af Amer: 56 mL/min — ABNORMAL LOW (ref >60)
GFR calc non Af Amer: 49 mL/min — ABNORMAL LOW (ref >60)
Glucose, Bld: 117 mg/dL — ABNORMAL HIGH (ref 70–99)
Potassium: 3.8 mmol/L (ref 3.5–5.1)
Sodium: 139 mmol/L (ref 135–145)
Total Bilirubin: 0.8 mg/dL (ref 0.3–1.2)
Total Protein: 6.4 g/dL — ABNORMAL LOW (ref 6.5–8.1)

## 2019-01-18 LAB — MAGNESIUM: Magnesium: 1.6 mg/dL — ABNORMAL LOW (ref 1.7–2.4)

## 2019-01-18 LAB — TROPONIN I (HIGH SENSITIVITY): Troponin I (High Sensitivity): 3 ng/L (ref <18)

## 2019-01-18 LAB — LACTATE DEHYDROGENASE: LDH: 135 U/L (ref 98–192)

## 2019-01-18 LAB — SAVE SMEAR(SSMR), FOR PROVIDER SLIDE REVIEW

## 2019-01-18 LAB — LIPASE, BLOOD: Lipase: 47 U/L (ref 11–51)

## 2019-01-18 LAB — CBG MONITORING, ED: Glucose-Capillary: 107 mg/dL — ABNORMAL HIGH (ref 70–99)

## 2019-01-18 MED ORDER — ACETAMINOPHEN 500 MG PO TABS
1000.0000 mg | ORAL_TABLET | Freq: Once | ORAL | Status: DC
  Filled 2019-01-18: qty 2

## 2019-01-18 MED ORDER — MORPHINE SULFATE (PF) 2 MG/ML IV SOLN
1.0000 mg | INTRAVENOUS | Status: DC | PRN
  Administered 2019-01-18: 1 mg via INTRAVENOUS
  Filled 2019-01-18: qty 1

## 2019-01-18 MED ORDER — DIPHENHYDRAMINE HCL 50 MG/ML IJ SOLN
12.5000 mg | Freq: Once | INTRAMUSCULAR | Status: AC
  Administered 2019-01-18: 12.5 mg via INTRAVENOUS
  Filled 2019-01-18: qty 1

## 2019-01-18 MED ORDER — OXYCODONE-ACETAMINOPHEN 5-325 MG PO TABS: 1.0000 | ORAL_TABLET | ORAL | Status: DC | PRN

## 2019-01-18 MED ORDER — SODIUM CHLORIDE 0.9 % IV BOLUS
1000.0000 mL | Freq: Once | INTRAVENOUS | Status: AC
  Administered 2019-01-18: 1000 mL via INTRAVENOUS

## 2019-01-18 MED ORDER — ACETAMINOPHEN 10 MG/ML IV SOLN
1000.0000 mg | Freq: Once | INTRAVENOUS | Status: DC
  Filled 2019-01-18: qty 100

## 2019-01-18 MED ORDER — SODIUM CHLORIDE 0.9 % IV SOLN
INTRAVENOUS | Status: DC
  Administered 2019-01-18 – 2019-01-19 (×2): via INTRAVENOUS

## 2019-01-18 MED ORDER — PROCHLORPERAZINE EDISYLATE 10 MG/2ML IJ SOLN
5.0000 mg | Freq: Once | INTRAMUSCULAR | Status: AC
  Administered 2019-01-18: 5 mg via INTRAVENOUS
  Filled 2019-01-18: qty 2

## 2019-01-18 MED ORDER — DICLOFENAC SODIUM 1 % TD GEL
4.0000 g | Freq: Once | TRANSDERMAL | Status: AC
  Administered 2019-01-18: 4 g via TOPICAL
  Filled 2019-01-18: qty 100

## 2019-01-18 MED ORDER — MAGNESIUM SULFATE 2 GM/50ML IV SOLN
2.0000 g | Freq: Once | INTRAVENOUS | Status: AC
  Administered 2019-01-18: 2 g via INTRAVENOUS
  Filled 2019-01-18: qty 50

## 2019-01-18 MED ORDER — ONDANSETRON HCL 4 MG/2ML IJ SOLN
4.0000 mg | Freq: Three times a day (TID) | INTRAMUSCULAR | Status: DC | PRN
  Administered 2019-01-18: 4 mg via INTRAVENOUS
  Filled 2019-01-18: qty 2

## 2019-01-18 MED ORDER — METHOCARBAMOL 500 MG PO TABS
500.0000 mg | ORAL_TABLET | Freq: Three times a day (TID) | ORAL | Status: DC | PRN
  Administered 2019-01-18: 500 mg via ORAL
  Filled 2019-01-18: qty 1

## 2019-01-18 NOTE — ED Provider Notes (Signed)
[facility]ERG[facility] DEPARTMENT Provider Note   CSN: [id number] Arrival date & time: 01/18/19  1555     History   Chief Complaint Chief Complaint  Patient presents with   Loss of Consciousness    HPI Sandra Montgomery is a 78 y.o. female.     78 yo F with a cc of syncope.  [place]  Thinks she was there for about 45 min and then felt hot all over and collapsed.  Nauseated now.  Having chronic diarrhea unchanged.  No chest pain, no sob, no abdominal pain.  The patient denies any neck pain or headache currently.  She thinks he fell about 6 weeks ago and had some bleeding in her head.  She was in the [facility].  She denies recent cough congestion or fever.  Think she has been eating and drinking normally.  The history is provided by the patient.  Loss of Consciousness Episode history:  Single Most recent episode:  Today Duration:  3 seconds Timing:  Constant Progression:  Unchanged Chronicity:  New Witnessed: yes   Relieved by:  Nothing Worsened by:  Nothing Ineffective treatments:  None tried Associated symptoms: nausea and vomiting   Associated symptoms: no chest pain, no dizziness, no fever, no headaches, no palpitations and no shortness of breath     Past Medical History:  Diagnosis Date   Allergic rhinitis, cause unspecified    Cancer ([place])    shoulder carcinoma   Diabetes mellitus without complication ([place])    GERD (gastroesophageal reflux disease)    History of blood transfusion    Hyperlipidemia    Hypertension    Impaired fasting glucose    Osteoarthrosis involving, or with mention of more than one site, but not specified as generalized, multiple sites    Osteoporosis    Pneumothorax 1965   prolonged bed rest    Unspecified hypothyroidism     Patient Active Problem List   Diagnosis Date Noted   Syncope 01/18/2019   Thrombocytopenia ([place]) 01/18/2019    Microcytic anemia 01/18/2019   Vertigo 12/05/2018   Right orbit fracture ([place]) 12/05/2018   Cerebral hemorrhage ([place]) 11/26/2018   Actinic keratosis 05/03/2018   Chronic kidney disease, stage III (moderate) 03/29/2018   Advanced directives, counseling/discussion 02/06/2014   Routine general medical examination at a health care facility 07/27/2012   Osteoporosis    Hypertension    Hyperlipidemia    Allergic rhinitis, cause unspecified    Hypothyroidism    Prediabetes    Osteoarthritis, multiple sites    GERD (gastroesophageal reflux disease)     Past Surgical History:  Procedure Laterality Date   ABDOMINAL HYSTERECTOMY  1981   BREAST EXCISIONAL BIOPSY Left years ago   x 2. Negative.    BREAST SURGERY  1985   non cancerous mass removed   CATARACT EXTRACTION W/PHACO Left 04/26/2017   Procedure: CATARACT EXTRACTION PHACO AND INTRAOCULAR LENS PLACEMENT ([place])  LEFT;  Surgeon: Leandrew Koyanagi, MD;  Location: [facility];  Service: Ophthalmology;  Laterality: Left;  Diabetic - oral meds   CATARACT EXTRACTION W/PHACO Right 05/24/2017   Procedure: CATARACT EXTRACTION PHACO AND INTRAOCULAR LENS PLACEMENT ([place]) RIGHT DIABETIC;  Surgeon: Leandrew Koyanagi, MD;  Location: [facility];  Service: Ophthalmology;  Laterality: Right;  Diabetic - oral meds   COLONOSCOPY     COLONOSCOPY WITH PROPOFOL N/A 07/18/2016   Procedure: COLONOSCOPY WITH PROPOFOL;  Surgeon: Manya Silvas, MD;  Location: [facility];  Service: Endoscopy;  Laterality: N/A;   ESOPHAGOGASTRODUODENOSCOPY     TONSILLECTOMY AND ADENOIDECTOMY  1961     OB History   No obstetric history on file.      Home Medications    Prior to Admission medications   Medication Sig Start Date End Date Taking? Authorizing Provider  atorvastatin (LIPITOR) 80 MG tablet 1 by mouth daily 03/29/18   Venia Carbon, MD  Biotin 1000 MCG tablet Take 1 mg by mouth 3 (three) times daily.     [provider]  Cholecalciferol (D3-1000) 25 MCG (1000 UT) capsule Take 1,000 Units by mouth daily.    [provider]  levothyroxine (SYNTHROID, LEVOTHROID) 75 MCG tablet Take 1 tablet (75 mcg total) by mouth daily. 03/29/18   Venia Carbon, MD  naproxen sodium (ANAPROX) 220 MG tablet Take 220 mg by mouth 2 (two) times daily with a meal.    [provider]  olmesartan (BENICAR) 40 MG tablet Take 1 tablet (40 mg total) by mouth daily. 05/09/18   Venia Carbon, MD  omeprazole (PRILOSEC) 40 MG capsule Take 1 capsule (40 mg total) by mouth daily. 03/29/18   Venia Carbon, MD    Family History Family History  Problem Relation Age of Onset   Alzheimer's disease Mother    Hyperlipidemia Father    Heart disease Father     Social History Social History   Tobacco Use   Smoking status: Never Smoker   Smokeless tobacco: Never Used  Substance Use Topics   Alcohol use: No   Drug use: No     Allergies   Short ragweed pollen ext and Tape   Review of Systems Review of Systems  Constitutional: Negative for chills and fever.  HENT: Negative for congestion and rhinorrhea.   Eyes: Negative for redness and visual disturbance.  Respiratory: Negative for shortness of breath and wheezing.   Cardiovascular: Positive for syncope. Negative for chest pain and palpitations.  Gastrointestinal: Positive for nausea and vomiting.  Genitourinary: Negative for dysuria and urgency.  Musculoskeletal: Negative for arthralgias and myalgias.  Skin: Negative for pallor and wound.  Neurological: Positive for syncope. Negative for dizziness and headaches.     Physical Exam Updated Vital Signs BP (!) 114/48    Pulse 63    Temp (!) 97.5 F (36.4 C) (Oral)    Resp 11    Ht 5\' 4"  (1.626 m)    Wt 68.9 kg    SpO2 100%    BMI 26.07 kg/m   Physical Exam Vitals signs and nursing note reviewed.  Constitutional:      General: She is not in acute distress.     Appearance: She is well-developed. She is not diaphoretic.  HENT:     Head: Normocephalic and atraumatic.  Eyes:     Pupils: Pupils are equal, round, and reactive to light.  Neck:     Musculoskeletal: Normal range of motion and neck supple.  Cardiovascular:     Rate and Rhythm: Normal rate and regular rhythm.     Heart sounds: No murmur. No friction rub. No gallop.   Pulmonary:     Effort: Pulmonary effort is normal.     Breath sounds: No wheezing or rales.  Abdominal:     General: There is no distension.     Palpations: Abdomen is soft.     Tenderness: There is no abdominal tenderness.  Musculoskeletal:        General: No  tenderness.  Skin:    General: Skin is warm and dry.  Neurological:     Mental Status: She is alert. She is disoriented.     Cranial Nerves: Cranial nerves are intact.     Sensory: Sensation is intact.     Motor: Motor function is intact.     Coordination: Coordination is intact.     Gait: Gait is intact.  Psychiatric:        Behavior: Behavior normal.      ED Treatments / Results  Labs (all labs ordered are listed, but only abnormal results are displayed) Labs Reviewed  CBC WITH DIFFERENTIAL/PLATELET - Abnormal; Notable for the following components:      Result Value   RBC 3.60 (*)    Hemoglobin 8.2 (*)    HCT 28.5 (*)    MCV 79.2 (*)    MCH 22.8 (*)    MCHC 28.8 (*)    Platelets 106 (*)    All other components within normal limits  COMPREHENSIVE METABOLIC PANEL - Abnormal; Notable for the following components:   CO2 21 (*)    Glucose, Bld 117 (*)    Creatinine, Ser 1.10 (*)    Total Protein 6.4 (*)    GFR calc non Af Amer 49 (*)    GFR calc Af Amer 56 (*)    All other components within normal limits  MAGNESIUM - Abnormal; Notable for the following components:   Magnesium 1.6 (*)    All other components within normal limits  CBG MONITORING, ED - Abnormal; Notable for the following components:   Glucose-Capillary 107 (*)    All other  components within normal limits  SARS CORONAVIRUS 2 (TAT 6-24 HRS)  LIPASE, BLOOD  TROPONIN I (HIGH SENSITIVITY)    EKG None ED ECG REPORT   Date: 01/18/2019  Rate: 59   Rhythm: sinus bradycardia  QRS Axis: normal  Intervals: normal  ST/T Wave abnormalities: t wave flattening  Conduction Disutrbances:none  Narrative Interpretation:   Old EKG Reviewed: unchanged No wpw, prolonged qt or brugada I have personally reviewed the EKG tracing and agree with the computerized printout as noted.   Radiology Dg Pelvis 1-2 Views  Result Date: 01/18/2019 CLINICAL DATA:  Pelvis/sacral pain after fall EXAM: SACRUM AND COCCYX - 2+ VIEW; PELVIS - 1-2 VIEW COMPARISON:  None. FINDINGS: Evaluation of the sacrum on frontal view is slightly limited secondary to overlying bowel gas and stool. Lateral view of the sacrum demonstrates cortical irregularity and angulation of the anterior and posterior cortex suggesting a minimally displaced fracture at approximately the S4 segment. The SI joints are intact with mild to moderate degenerative changes. Pubic symphysis is intact. IMPRESSION: Findings suspicious for a minimally displaced fracture of the sacrum at approximately the S4 segment. Electronically Signed   By: Davina Poke M.D.   On: 01/18/2019 18:41   Dg Sacrum/coccyx  Result Date: 01/18/2019 CLINICAL DATA:  Pelvis/sacral pain after fall EXAM: SACRUM AND COCCYX - 2+ VIEW; PELVIS - 1-2 VIEW COMPARISON:  None. FINDINGS: Evaluation of the sacrum on frontal view is slightly limited secondary to overlying bowel gas and stool. Lateral view of the sacrum demonstrates cortical irregularity and angulation of the anterior and posterior cortex suggesting a minimally displaced fracture at approximately the S4 segment. The SI joints are intact with mild to moderate degenerative changes. Pubic symphysis is intact. IMPRESSION: Findings suspicious for a minimally displaced fracture of the sacrum at approximately the  S4 segment. Electronically Signed   By:  Davina Poke M.D.   On: 01/18/2019 18:41   Ct Head Wo Contrast  Result Date: 01/18/2019 CLINICAL DATA:  Syncopal episode, recent brain bleed EXAM: CT HEAD WITHOUT CONTRAST TECHNIQUE: Contiguous axial images were obtained from the base of the skull through the vertex without intravenous contrast. COMPARISON:  CT November 26, 2018 FINDINGS: Brain: Gliosis in the right mesial temporal lobe corresponding to the site of prior hyperdense hemorrhage with resolution of the layering intraventricular hemorrhage seen on comparison studies. No evidence of acute infarction, hemorrhage, hydrocephalus, extra-axial collection or mass lesion/mass effect. Symmetric prominence of the ventricles, cisterns and sulci compatible with parenchymal volume loss. Patchy areas of white matter hypoattenuation are most compatible with chronic microvascular angiopathy. Vascular: Atherosclerotic calcification of the carotid siphons and intradural vertebral arteries. No hyperdense vessel. Incidental note made of carotid protrusion into the anterolateral sphenoid sinuses, an anatomic variant. Skull: Acute right parieto-occipital scalp swelling with a 4 mm subgaleal hematoma. No subjacent calvarial fracture. Sinuses/Orbits: Remote posttraumatic deformity involving the the right orbital floor. Some partial deviation of the right inferior rectus. Remote displaced fracture of the anterior process of the right maxilla. No sinus air-fluid levels are residual hemosinus. Orbits are otherwise unremarkable aside from lens extractions. Other: None IMPRESSION: 1. No acute intracranial abnormality. 2. Gliosis in the right mesial temporal lobe corresponding to the site of prior hyperdense hemorrhage with resolution of the layering intraventricular hemorrhage seen on comparison studies. 3. Right parieto-occipital scalp swelling and subgaleal hematoma without subjacent calvarial fracture. 4. Remote posttraumatic  deformity of the floor of the right orbit with persistent deviation of the inferior rectus. 5. Remote fracture of the right maxillary frontal process. Electronically Signed   By: Lovena Le M.D.   On: 01/18/2019 17:54    Procedures Procedures (including critical care time)  Medications Ordered in ED Medications  acetaminophen (TYLENOL) tablet 1,000 mg (1,000 mg Oral Not Given 01/18/19 1843)  prochlorperazine (COMPAZINE) injection 5 mg (5 mg Intravenous Given 01/18/19 1631)  diphenhydrAMINE (BENADRYL) injection 12.5 mg (12.5 mg Intravenous Given 01/18/19 1631)  sodium chloride 0.9 % bolus 1,000 mL (0 mLs Intravenous Stopped 01/18/19 1730)  magnesium sulfate IVPB 2 g 50 mL (0 g Intravenous Stopped 01/18/19 1856)  diclofenac sodium (VOLTAREN) 1 % transdermal gel 4 g (4 g Topical Given 01/18/19 1841)  prochlorperazine (COMPAZINE) injection 5 mg (5 mg Intravenous Given 01/18/19 2044)  diphenhydrAMINE (BENADRYL) injection 12.5 mg (12.5 mg Intravenous Given 01/18/19 2044)     Initial Impression / Assessment and Plan / ED Course  I have reviewed the triage vital signs and the nursing notes.  Pertinent labs & imaging results that were available during my care of the patient were reviewed by me and considered in my medical decision making (see chart for details).        78 yo F with a chief complaints of a syncopal event.  By history it sounds vagal though the patient is having some persistent vomiting and she is confused.  With her recent intracranial hemorrhage will obtain a CT scan of the head.  Lab work bolus of IV fluids.  CT scan of the head is negative for acute intracranial hemorrhage.  Lab work without a significant abnormality, hemoglobin is 8.2 was 8.9 a couple months ago when she was admitted.  Her magnesium was mildly low at 1.6 will replenish.  Troponin was negative.  I was called back into the room because she was complaining of some lower back pain.  She actually points to  the  area of her tailbone.  Plain film is concerning for a possible sacral fracture.  Will attempt to ambulate.  Patient was able to ambulate independently however became very nauseated and continued to vomit.  Still not able to tolerate p.o. in the ED.  She has now gotten 8 mg of Zofran and a dose of Compazine.  At this point if she still feels unwell I will discuss with the hospitalist for possible admission.  I discussed case with Dr. Griffin Basil, orthopedics.  I discussed the case with him and the patient's injury on x-ray.  He recommended CT scan of the pelvis to further visualize.  Initial recommendation was for full weightbearing.  Stated that they would put a note in the chart once the CT scan has returned.  The patients results and plan were reviewed and discussed.   Any x-rays performed were independently reviewed by myself.   Differential diagnosis were considered with the presenting HPI.  Medications  acetaminophen (TYLENOL) tablet 1,000 mg (1,000 mg Oral Not Given 01/18/19 1843)  prochlorperazine (COMPAZINE) injection 5 mg (5 mg Intravenous Given 01/18/19 1631)  diphenhydrAMINE (BENADRYL) injection 12.5 mg (12.5 mg Intravenous Given 01/18/19 1631)  sodium chloride 0.9 % bolus 1,000 mL (0 mLs Intravenous Stopped 01/18/19 1730)  magnesium sulfate IVPB 2 g 50 mL (0 g Intravenous Stopped 01/18/19 1856)  diclofenac sodium (VOLTAREN) 1 % transdermal gel 4 g (4 g Topical Given 01/18/19 1841)  prochlorperazine (COMPAZINE) injection 5 mg (5 mg Intravenous Given 01/18/19 2044)  diphenhydrAMINE (BENADRYL) injection 12.5 mg (12.5 mg Intravenous Given 01/18/19 2044)    Vitals:   01/18/19 1615 01/18/19 1630 01/18/19 1700 01/18/19 1715  BP: (!) 139/57 (!) 149/64 (!) 121/59 (!) 114/48  Pulse: 66 67 70 63  Resp: 16 17 15 11   Temp:      TempSrc:      SpO2: 98% 97% 100% 100%  Weight:      Height:        Final diagnoses:  Syncope and collapse  Closed minimally displaced zone II fracture of sacrum,  initial encounter (HCC)  Persistent vomiting    Admission/ observation were discussed with the admitting physician, patient and/or family and they are comfortable with the plan.    Final Clinical Impressions(s) / ED Diagnoses   Final diagnoses:  Syncope and collapse  Closed minimally displaced zone II fracture of sacrum, initial encounter [facility])  Persistent vomiting    ED Discharge Orders    None       Deno Etienne, DO 01/18/19 2059

## 2019-01-18 NOTE — ED Triage Notes (Signed)
Brought in by EMS, pt had syncopal episode while [place] the [place]. EMS reports hematoma on the back of head. Recently had a brain bleed, does not take blood thinners. 8mg  Zofran administered en route, pt still vomiting.

## 2019-01-18 NOTE — ED Notes (Signed)
Pt. Was sleeping, woke up to ambulate, took a few min's to get the room to stop spinning, then was able to get up under own power, ambulated well, had an a couple instances of dizziness, and had some dry heaves, with some yellow liquid coming back up.

## 2019-01-18 NOTE — H&P (Signed)
History and Physical    Tynslee Nowak [place] [id number] DOB: [birth year] DOA: 01/18/2019  Referring MD/NP/PA:   PCP: Venia Carbon, MD   Patient coming from:  The patient is coming from home.  At baseline, pt is independent for most of ADL.        Chief Complaint: syncope  HPI: ELEANORA REUTOV is a 78 y.o. female with medical history significant of hypertension, hyperlipidemia, borderline diabetes not taking medications, GERD, hypothyroidism, shoulder carcinoma, LAD, recent ICH (11/25/2018), CKD stage III, who presents with syncope.  Per pt's daughter, pt passed out and fell while standing in line at the post office today.  Patient is not sure how long she passed out.  EMS reports hematoma on the back of head.  Patient denies unilateral weakness, numbness or tingling to extremities.  No facial droop or slurred speech.  Denies chest pain, shortness of breath.  Patient states that she has mild intermittent dizziness recently.  She had mild dizziness in the morning, but not at this moment.  No ear ringing. She has chronic intermittent mild diarrhea, which has not changed. After the event, she developed nausea and vomited several times with nonbilious nonbloody vomitus.  She does not have abdominal pain.  Denies symptoms of UTI. She has persistent pain in buttock, which is moderate, sharp, nonradiating, aggravated by movement.  ED Course: pt was found to have WBC 8.6, platelets of 106) 372 on 11/25/2018), negative troponin, lipase 47, pending COVID-19 test, renal function at the baseline, temperature 97.6, blood pressure 114/48, heart rate 63, oxygen saturation 97-100% on room air.  Patient is placed on telemetry bed for observation.  Dr. Griffin Basil of ortho was consulted by EDP.  # CT-head: 1. No acute intracranial abnormality. 2. Gliosis in the right mesial temporal lobe corresponding to the site of prior hyperdense hemorrhage with resolution of the layering intraventricular hemorrhage seen on  comparison studies. 3. Right parieto-occipital scalp swelling and subgaleal hematoma without subjacent calvarial fracture. 4. Remote posttraumatic deformity of the floor of the right orbit with persistent deviation of the inferior rectus. 5. Remote fracture of the right maxillary frontal process.  # X-ray of sacrum/coccyx: Findings suspicious for a minimally displaced fracture of the sacrum at approximately the S4 segment.  Review of Systems:   General: no fevers, chills, no body weight gain, has fatigue HEENT: no blurry vision, hearing changes or sore throat Respiratory: no dyspnea, coughing, wheezing CV: no chest pain, no palpitations GI: has nausea, vomiting, no abdominal pain, diarrhea, constipation GU: no dysuria, burning on urination, increased urinary frequency, hematuria  Ext: no leg edema Neuro: no unilateral weakness, numbness, or tingling, no vision change or hearing loss. Has syncope. Has dizziness Skin: no rash, no skin tear. MSK: No muscle spasm, no deformity, no limitation of range of movement in spin Heme: No easy bruising.  Travel history: No recent long distant travel.  Allergy:  Allergies  Allergen Reactions   Short Ragweed Pollen Ext Other (See Comments)    Congestion/cough   Tape Rash and Other (See Comments)    Peels the skin     Past Medical History:  Diagnosis Date   Allergic rhinitis, cause unspecified    Cancer ([place])    shoulder carcinoma   Diabetes mellitus without complication (HCC)    GERD (gastroesophageal reflux disease)    History of blood transfusion    Hyperlipidemia    Hypertension    Impaired fasting glucose    Osteoarthrosis involving, or with mention of more  than one site, but not specified as generalized, multiple sites    Osteoporosis    Pneumothorax 1965   prolonged bed rest    Unspecified hypothyroidism     Past Surgical History:  Procedure Laterality Date   ABDOMINAL HYSTERECTOMY  1981   BREAST EXCISIONAL  BIOPSY Left years ago   x 2. Negative.    BREAST SURGERY  1985   non cancerous mass removed   CATARACT EXTRACTION W/PHACO Left 04/26/2017   Procedure: CATARACT EXTRACTION PHACO AND INTRAOCULAR LENS PLACEMENT ([place])  LEFT;  Surgeon: Leandrew Koyanagi, MD;  Location: [place];  Service: Ophthalmology;  Laterality: Left;  Diabetic - oral meds   CATARACT EXTRACTION W/PHACO Right 05/24/2017   Procedure: CATARACT EXTRACTION PHACO AND INTRAOCULAR LENS PLACEMENT ([place]) RIGHT DIABETIC;  Surgeon: Leandrew Koyanagi, MD;  Location: [place];  Service: Ophthalmology;  Laterality: Right;  Diabetic - oral meds   COLONOSCOPY     COLONOSCOPY WITH PROPOFOL N/A 07/18/2016   Procedure: COLONOSCOPY WITH PROPOFOL;  Surgeon: Manya Silvas, MD;  Location[facility] ENDOSCOPY;  Service: Endoscopy;  Laterality: N/A;   ESOPHAGOGASTRODUODENOSCOPY     TONSILLECTOMY AND ADENOIDECTOMY  1961    Social History:  reports that she has never smoked. She has never used smokeless tobacco. She reports that she does not drink alcohol or use drugs.  Family History:  Family History  Problem Relation Age of Onset   Alzheimer's disease Mother    Hyperlipidemia Father    Heart disease Father      Prior to Admission medications   Medication Sig Start Date End Date Taking? Authorizing Provider  atorvastatin (LIPITOR) 80 MG tablet 1 by mouth daily 03/29/18   Venia Carbon, MD  Biotin 1000 MCG tablet Take 1 mg by mouth 3 (three) times daily.    [provider]  Cholecalciferol (D3-1000) 25 MCG (1000 UT) capsule Take 1,000 Units by mouth daily.    [provider]  levothyroxine (SYNTHROID, LEVOTHROID) 75 MCG tablet Take 1 tablet (75 mcg total) by mouth daily. 03/29/18   Venia Carbon, MD  naproxen sodium (ANAPROX) 220 MG tablet Take 220 mg by mouth 2 (two) times daily with a meal.    [provider]  olmesartan (BENICAR) 40 MG tablet Take 1 tablet (40 mg total) by  mouth daily. 05/09/18   Venia Carbon, MD  omeprazole (PRILOSEC) 40 MG capsule Take 1 capsule (40 mg total) by mouth daily. 03/29/18   Venia Carbon, MD    Physical Exam: Vitals:   01/18/19 1615 01/18/19 1630 01/18/19 1700 01/18/19 1715  BP: (!) 139/57 (!) 149/64 (!) 121/59 (!) 114/48  Pulse: 66 67 70 63  Resp: 16 17 15 11   Temp:      TempSrc:      SpO2: 98% 97% 100% 100%  Weight:      Height:       General: Not in acute distress HEENT:       Eyes: PERRL, EOMI, no scleral icterus.       ENT: No discharge from the ears and nose, no pharynx injection, no tonsillar enlargement.        Neck: No JVD, no bruit, no mass felt. Heme: No neck lymph node enlargement. Cardiac: S1/S2, RRR, No murmurs, No gallops or rubs. Respiratory: No rales, wheezing, rhonchi or rubs. GI: Soft, nondistended, nontender, no rebound pain, no organomegaly, BS present. GU: No hematuria Ext: No pitting leg edema bilaterally. 2+DP/PT pulse bilaterally. Musculoskeletal: has tenderness in  sacral area. Skin: No rashes.  Neuro: Alert, oriented X3, cranial nerves II-XII grossly intact, moves all extremities normally. Muscle strength 5/5 in all extremities. Psych: Patient is not psychotic, no suicidal or hemocidal ideation.  Labs on Admission: I have personally reviewed following labs and imaging studies  CBC: Recent Labs  Lab 01/18/19 1615  WBC 8.6  NEUTROABS 6.8  HGB 8.2*  HCT 28.5*  MCV 79.2*  PLT [id number]*   Basic Metabolic Panel: Recent Labs  Lab 01/18/19 1615  NA 139  K 3.8  CL 106  CO2 21*  GLUCOSE 117*  BUN 19  CREATININE 1.10*  CALCIUM 9.2  MG 1.6*   GFR: Estimated Creatinine Clearance: 41.5 mL/min (A) (by C-G formula based on SCr of 1.1 mg/dL (H)). Liver Function Tests: Recent Labs  Lab 01/18/19 1615  AST 21  ALT 15  ALKPHOS 68  BILITOT 0.8  PROT 6.4*  ALBUMIN 3.9   Recent Labs  Lab 01/18/19 1615  LIPASE 47   No results for input(s): AMMONIA in the last 168  hours. Coagulation Profile: No results for input(s): INR, PROTIME in the last 168 hours. Cardiac Enzymes: No results for input(s): CKTOTAL, CKMB, CKMBINDEX, TROPONINI in the last 168 hours. BNP (last 3 results) No results for input(s): PROBNP in the last 8760 hours. HbA1C: No results for input(s): HGBA1C in the last 72 hours. CBG: Recent Labs  Lab 01/18/19 1607  GLUCAP 107*   Lipid Profile: No results for input(s): CHOL, HDL, LDLCALC, TRIG, CHOLHDL, LDLDIRECT in the last 72 hours. Thyroid Function Tests: No results for input(s): TSH, T4TOTAL, FREET4, T3FREE, THYROIDAB in the last 72 hours. Anemia Panel: No results for input(s): VITAMINB12, FOLATE, FERRITIN, TIBC, IRON, RETICCTPCT in the last 72 hours. Urine analysis:    Component Value Date/Time   COLORURINE YELLOW (A) 11/25/2018 1925   APPEARANCEUR HAZY (A) 11/25/2018 1925   LABSPEC 1.021 11/25/2018 1925   PHURINE 5.0 11/25/2018 1925   GLUCOSEU NEGATIVE 11/25/2018 1925   HGBUR NEGATIVE 11/25/2018 1925   BILIRUBINUR NEGATIVE 11/25/2018 [place] NEGATIVE 11/25/2018 1925   PROTEINUR 30 (A) 11/25/2018 1925   NITRITE NEGATIVE 11/25/2018 1925   LEUKOCYTESUR SMALL (A) 11/25/2018 1925   Sepsis Labs: @LABRCNTIP (procalcitonin:4,lacticidven:4) )No results found for this or any previous visit (from the past 240 hour(s)).   Radiological Exams on Admission: Dg Pelvis 1-2 Views  Result Date: 01/18/2019 CLINICAL DATA:  Pelvis/sacral pain after fall EXAM: SACRUM AND COCCYX - 2+ VIEW; PELVIS - 1-2 VIEW COMPARISON:  None. FINDINGS: Evaluation of the sacrum on frontal view is slightly limited secondary to overlying bowel gas and stool. Lateral view of the sacrum demonstrates cortical irregularity and angulation of the anterior and posterior cortex suggesting a minimally displaced fracture at approximately the S4 segment. The SI joints are intact with mild to moderate degenerative changes. Pubic symphysis is intact. IMPRESSION:  Findings suspicious for a minimally displaced fracture of the sacrum at approximately the S4 segment. Electronically Signed   By: Davina Poke M.D.   On: 01/18/2019 18:41   Dg Sacrum/coccyx  Result Date: 01/18/2019 CLINICAL DATA:  Pelvis/sacral pain after fall EXAM: SACRUM AND COCCYX - 2+ VIEW; PELVIS - 1-2 VIEW COMPARISON:  None. FINDINGS: Evaluation of the sacrum on frontal view is slightly limited secondary to overlying bowel gas and stool. Lateral view of the sacrum demonstrates cortical irregularity and angulation of the anterior and posterior cortex suggesting a minimally displaced fracture at approximately the S4 segment. The SI joints are intact with mild  to moderate degenerative changes. Pubic symphysis is intact. IMPRESSION: Findings suspicious for a minimally displaced fracture of the sacrum at approximately the S4 segment. Electronically Signed   By: Davina Poke M.D.   On: 01/18/2019 18:41   Ct Head Wo Contrast  Result Date: 01/18/2019 CLINICAL DATA:  Syncopal episode, recent brain bleed EXAM: CT HEAD WITHOUT CONTRAST TECHNIQUE: Contiguous axial images were obtained from the base of the skull through the vertex without intravenous contrast. COMPARISON:  CT November 26, 2018 FINDINGS: Brain: Gliosis in the right mesial temporal lobe corresponding to the site of prior hyperdense hemorrhage with resolution of the layering intraventricular hemorrhage seen on comparison studies. No evidence of acute infarction, hemorrhage, hydrocephalus, extra-axial collection or mass lesion/mass effect. Symmetric prominence of the ventricles, cisterns and sulci compatible with parenchymal volume loss. Patchy areas of white matter hypoattenuation are most compatible with chronic microvascular angiopathy. Vascular: Atherosclerotic calcification of the carotid siphons and intradural vertebral arteries. No hyperdense vessel. Incidental note made of carotid protrusion into the anterolateral sphenoid sinuses, an  anatomic variant. Skull: Acute right parieto-occipital scalp swelling with a 4 mm subgaleal hematoma. No subjacent calvarial fracture. Sinuses/Orbits: Remote posttraumatic deformity involving the the right orbital floor. Some partial deviation of the right inferior rectus. Remote displaced fracture of the anterior process of the right maxilla. No sinus air-fluid levels are residual hemosinus. Orbits are otherwise unremarkable aside from lens extractions. Other: None IMPRESSION: 1. No acute intracranial abnormality. 2. Gliosis in the right mesial temporal lobe corresponding to the site of prior hyperdense hemorrhage with resolution of the layering intraventricular hemorrhage seen on comparison studies. 3. Right parieto-occipital scalp swelling and subgaleal hematoma without subjacent calvarial fracture. 4. Remote posttraumatic deformity of the floor of the right orbit with persistent deviation of the inferior rectus. 5. Remote fracture of the right maxillary frontal process. Electronically Signed   By: Lovena Le M.D.   On: 01/18/2019 17:54   Ct Pelvis Wo Contrast  Result Date: 01/18/2019 CLINICAL DATA:  Question a sacral fracture on radiograph EXAM: CT PELVIS WITHOUT CONTRAST TECHNIQUE: Multidetector CT imaging of the pelvis was performed following the standard protocol without intravenous contrast. COMPARISON:  Radiograph same day FINDINGS: Urinary Tract: The visualized distal ureters and bladder appear unremarkable. Bowel: No bowel wall thickening, distention or surrounding inflammation identified within the pelvis. Scattered colonic diverticula are noted. Vascular/Lymphatic: No enlarged pelvic lymph nodes identified. Scattered aortic atherosclerotic calcifications are seen without aneurysmal dilatation. Reproductive: The patient is status post hysterectomy. No adnexal masses or collections seen. Other: No focal soft tissue swelling or soft tissue mass. No inguinal adenopathy. Musculoskeletal: There is a  nondisplaced buckle fracture seen of the S4-5 sacral elements. This is age indeterminate however. No other fracture is identified. Bilateral femoroacetabular joint osteoarthritis is seen, left greater than right. These of 8 seen at the bilateral greater trochanters. Sclerosis around the bilateral sacroiliac joints. Degenerative changes in the lower lumbar spine. IMPRESSION: Nondisplaced buckle fracture of the S4-5 sacral elements, which is age indeterminate. No other fracture identified. Electronically Signed   By: Prudencio Pair M.D.   On: 01/18/2019 22:02     EKG: Independently reviewed.  Sinus rhythm, QTC 428, LAD, early R wave progression   Assessment/Plan Principal Problem:   Syncope Active Problems:   Hypertension   Hyperlipidemia   Hypothyroidism   GERD (gastroesophageal reflux disease)   Chronic kidney disease, stage III (moderate)   Cerebral hemorrhage (HCC)   Thrombocytopenia (HCC)   Microcytic anemia   Buttock pain  Diarrhea   Nausea & vomiting   Syncope:  Etiology is not clear. The differential diagnosis is broad, including vasovagal syncope, TIA/stroke, arrhythmia, ACS (less likely, given no chest pain), orthostatic status, carotid artery stenosis. Pt states that she had a negative carotid Doppler test done recently (I can not find report in Epic).  - Place on tele bed for obs - Orthostatic vital signs  - MRI-brain - 2d echo - Neuro checks  - IVF: NS 75 cc/h - PT/OT eval and treat  Buttock pain: X-ray showed findings suspicious for a minimally displaced fracture of the sacrum at approximately the S4 segment. Ortho, Dr. Griffin Basil was consulted by EDP. He recommended to get CT scan for further evaluation -prn Percocet, morphine, Robaxin -Follow-up orthopedic surgeons recommendation  Nausea & vomiting: Patient vomited several times, currently no vomiting, but has persistent nausea.  Etiology is not clear.  No abdominal pain.  Her diarrhea is chronic issue which has not  changed today. Patient may have brain concussion?. -As needed Zofran for nausea  Hypertension: -Irbesartan -Hydralazine as needed orally  Hyperlipidemia: -lipitor  Hypothyroidism: TSH 11/26/18. Pt was taking Synthroid which was discontinued by her doctor recently. -Follow-up with PCP  GERD (gastroesophageal reflux disease): -Protonix  Chronic kidney disease, stage III (moderate): stable. Cre 1.01 and BUN 19 -f/u by BMP  Recent hx of Cerebral hemorrhage ([place]): CThead showed gliosis in the right mesial temporal lobe corresponding to the site of prior hyperdense hemorrhage with resolution of the layering intraventricular hemorrhage seen on comparison studies. -observe closely  Thrombocytopenia [facility]): Platelet 106 (372 on 11/25/2018).  Etiology is not clear.  Mental status normal -check LDH and peripheral smear  Microcytic anemia: Hemoglobin stable, 8.9 on 11/25/2018, 8.2 today -f/u by CBC and f/u with PCP  Diarrhea: this is chronic issue, mild, intermittent.  No change today. -Supportive care     DVT ppx: SCD Code Status: Full code Family Communication: Yes, patient's  daugthter  at bed side Disposition Plan:  Anticipate discharge back to previous home environment Consults called:  Ortho, Dr. Griffin Basil Admission status: Obs / tele     Date of Service 01/18/2019    [facility]   If 7PM-7AM, please contact night-coverage [email or web address] Password [facility] 01/18/2019, 10:47 PM

## 2019-01-19 ENCOUNTER — Encounter (HOSPITAL_COMMUNITY): Payer: Self-pay

## 2019-01-19 ENCOUNTER — Observation Stay (HOSPITAL_BASED_OUTPATIENT_CLINIC_OR_DEPARTMENT_OTHER): Payer: Medicare Other

## 2019-01-19 ENCOUNTER — Observation Stay (HOSPITAL_COMMUNITY): Payer: Medicare Other

## 2019-01-19 DIAGNOSIS — R55 Syncope and collapse: Principal | ICD-10-CM

## 2019-01-19 DIAGNOSIS — S32110A Nondisplaced Zone I fracture of sacrum, initial encounter for closed fracture: Secondary | ICD-10-CM | POA: Diagnosis not present

## 2019-01-19 DIAGNOSIS — S0003XA Contusion of scalp, initial encounter: Secondary | ICD-10-CM | POA: Diagnosis not present

## 2019-01-19 LAB — BASIC METABOLIC PANEL
Anion gap: 11 (ref 5–15)
BUN: 16 mg/dL (ref 8–23)
CO2: 23 mmol/L (ref 22–32)
Calcium: 9 mg/dL (ref 8.9–10.3)
Chloride: 106 mmol/L (ref 98–111)
Creatinine, Ser: 1.23 mg/dL — ABNORMAL HIGH (ref 0.44–1.00)
GFR calc Af Amer: 49 mL/min — ABNORMAL LOW (ref >60)
GFR calc non Af Amer: 43 mL/min — ABNORMAL LOW (ref >60)
Glucose, Bld: 102 mg/dL — ABNORMAL HIGH (ref 70–99)
Potassium: 4.5 mmol/L (ref 3.5–5.1)
Sodium: 140 mmol/L (ref 135–145)

## 2019-01-19 LAB — SARS CORONAVIRUS 2 (TAT 6-24 HRS): SARS Coronavirus 2: NEGATIVE

## 2019-01-19 LAB — GLUCOSE, CAPILLARY: Glucose-Capillary: 83 mg/dL (ref 70–99)

## 2019-01-19 LAB — ECHOCARDIOGRAM COMPLETE
Height: 64 in
Weight: 2430.35 oz

## 2019-01-19 LAB — PROTIME-INR
INR: 1.2 (ref 0.8–1.2)
Prothrombin Time: 14.6 seconds (ref 11.4–15.2)

## 2019-01-19 LAB — APTT: aPTT: 30 seconds (ref 24–36)

## 2019-01-19 MED ORDER — VITAMIN D 25 MCG (1000 UNIT) PO TABS
1000.0000 [IU] | ORAL_TABLET | Freq: Every morning | ORAL | Status: DC
  Administered 2019-01-19: 1000 [IU] via ORAL
  Filled 2019-01-19: qty 1

## 2019-01-19 MED ORDER — ACETAMINOPHEN 325 MG PO TABS: 650.0000 mg | ORAL_TABLET | Freq: Four times a day (QID) | ORAL | Status: DC | PRN

## 2019-01-19 MED ORDER — ATORVASTATIN CALCIUM 80 MG PO TABS
80.0000 mg | ORAL_TABLET | Freq: Every day | ORAL | Status: DC
  Administered 2019-01-19: 80 mg via ORAL
  Filled 2019-01-19: qty 1

## 2019-01-19 MED ORDER — SODIUM CHLORIDE 0.9% FLUSH
3.0000 mL | Freq: Two times a day (BID) | INTRAVENOUS | Status: DC
  Administered 2019-01-19: 3 mL via INTRAVENOUS

## 2019-01-19 MED ORDER — HYDRALAZINE HCL 25 MG PO TABS: 25.0000 mg | ORAL_TABLET | Freq: Three times a day (TID) | ORAL | Status: DC | PRN

## 2019-01-19 MED ORDER — ACETAMINOPHEN 650 MG RE SUPP: 650.0000 mg | Freq: Four times a day (QID) | RECTAL | Status: DC | PRN

## 2019-01-19 MED ORDER — IRBESARTAN 300 MG PO TABS
300.0000 mg | ORAL_TABLET | Freq: Every day | ORAL | Status: DC
  Administered 2019-01-19: 300 mg via ORAL
  Filled 2019-01-19: qty 1

## 2019-01-19 MED ORDER — PANTOPRAZOLE SODIUM 40 MG PO TBEC
40.0000 mg | DELAYED_RELEASE_TABLET | Freq: Every day | ORAL | Status: DC
  Administered 2019-01-19: 40 mg via ORAL
  Filled 2019-01-19: qty 1

## 2019-01-19 MED ORDER — BIOTIN 1000 MCG PO TABS: 1000.0000 ug | ORAL_TABLET | Freq: Every day | ORAL | Status: DC

## 2019-01-19 NOTE — Evaluation (Signed)
Occupational Therapy Evaluation Patient Details Name: Sandra Montgomery MRN: [id number] DOB: 05/30/1940 Today's Date: 01/19/2019    History of Present Illness 78 y.o. female with medical history significant of hypertension, hyperlipidemia, borderline diabetes not taking medications, GERD, hypothyroidism, shoulder carcinoma, LAD, recent ICH (11/25/2018), CKD stage III, who presents with syncope and fall,    Clinical Impression   Pt admitted with the above diagnoses. Pt presents close to baseline independent with ADLs. General education provided on fall prevention and home safety with ADL/IADL routines. No further needs indicated at this time. OT signing off.      Follow Up Recommendations  No OT follow up    Equipment Recommendations  None recommended by OT    Recommendations for Other Services       Precautions / Restrictions Precautions Precautions: Fall Restrictions Weight Bearing Restrictions: No      Mobility Bed Mobility Overal bed mobility: Modified Independent             General bed mobility comments: extra time  Transfers Overall transfer level: Modified independent Equipment used: None             General transfer comment: supervision for safety    Balance Overall balance assessment: No apparent balance deficits (not formally assessed)                               Standardized Balance Assessment Standardized Balance Assessment : Dynamic Gait Index   Dynamic Gait Index Level Surface: Normal Change in Gait Speed: Mild Impairment Gait with Horizontal Head Turns: Normal Gait with Vertical Head Turns: Mild Impairment Gait and Pivot Turn: Normal Step Over Obstacle: Mild Impairment Step Around Obstacles: Normal Steps: Mild Impairment Total Score: 20     ADL either performed or assessed with clinical judgement   ADL Overall ADL's : Needs assistance/impaired Eating/Feeding: Set up;Sitting   Grooming:  Supervision/safety;Standing   Upper Body Bathing: Set up;Sitting   Lower Body Bathing: Supervison/ safety;Sit to/from stand   Upper Body Dressing : Set up;Sitting   Lower Body Dressing: Supervision/safety;Sit to/from stand   Toilet Transfer: Supervision/safety;Ambulation   Toileting- Clothing Manipulation and Hygiene: Supervision/safety;Sit to/from stand   Tub/ Shower Transfer: Supervision/safety;Ambulation;Shower seat   Functional mobility during ADLs: Supervision/safety       Vision Baseline Vision/History: Wears glasses       Perception     Praxis      Pertinent Vitals/Pain Pain Assessment: Faces Faces Pain Scale: Hurts little more Pain Location: sacrum Pain Descriptors / Indicators: Aching Pain Intervention(s): Monitored during session     Hand Dominance     Extremity/Trunk Assessment Upper Extremity Assessment Upper Extremity Assessment: Overall WFL for tasks assessed   Lower Extremity Assessment Lower Extremity Assessment: Defer to PT evaluation       Communication Communication Communication: No difficulties   Cognition Arousal/Alertness: Awake/alert Behavior During Therapy: WFL for tasks assessed/performed Overall Cognitive Status: Within Functional Limits for tasks assessed                                     General Comments  Orthostatic recorded, no significant drop in BP. Discussed home safety, self care and home management strategies. Pt also able to toilet BM herself in restroom during therapy session, cues for navigatin with IV pole only in restroom.    Exercises     Shoulder Instructions  Home Living Family/patient expects to be discharged to:: Private residence Living Arrangements: Alone Available Help at Discharge: Family;Friend(s) Type of Home: House Home Access: Ramped entrance;Stairs to enter     Home Layout: Multi-level;Able to live on main level with bedroom/bathroom;Laundry or work area in  Building surveyor of Steps: flight to basement Alternate Level Stairs-Rails: Left Bathroom Shower/Tub: Chief Strategy Officer: Environmental consultant - 4 wheels;Cane - single point;Transport chair;Shower seat - built in;Toilet riser   Additional Comments: Daughter lives next door; son lives close by; and also has friend who can assist.      Prior Functioning/Environment Level of Independence: Independent        Comments: No other falls in past 6 months.  Husband is at ALF.        OT Problem List:        OT Treatment/Interventions:      OT Goals(Current goals can be found in the care plan section) Acute Rehab OT Goals Patient Stated Goal: Go home  OT Frequency:     Barriers to D/C:            Co-evaluation              AM-PAC OT "6 Clicks" Daily Activity     Outcome Measure Help from another person eating meals?: None Help from another person taking care of personal grooming?: None Help from another person toileting, which includes using toliet, bedpan, or urinal?: None Help from another person bathing (including washing, rinsing, drying)?: None Help from another person to put on and taking off regular upper body clothing?: None Help from another person to put on and taking off regular lower body clothing?: None 6 Click Score: 24   End of Session    Activity Tolerance: Patient tolerated treatment well Patient left: in bed;with call bell/phone within reach  OT Visit Diagnosis: Unsteadiness on feet (R26.81)                Time: [id number] OT Time Calculation (min): 18 min Charges:  OT General Charges $OT Visit: 1 Visit OT Evaluation $OT Eval Low Complexity: [place], OT Acute Rehabilitation Services Pager: [phone] Office: [phone]   [name] 01/19/2019, 12:54 PM

## 2019-01-19 NOTE — Evaluation (Signed)
Physical Therapy Evaluation and Discharge Patient Details Name: Sandra Montgomery MRN: [id number] DOB: October 31, 1940 Today's Date: 01/19/2019   History of Present Illness  78 y.o. female with medical history significant of hypertension, hyperlipidemia, borderline diabetes not taking medications, GERD, hypothyroidism, shoulder carcinoma, LAD, recent ICH (11/25/2018), CKD stage III, who presents with syncope and fall,   Clinical Impression  Patient evaluated by Physical Therapy with no further acute PT needs identified. All education has been completed and the patient has no further questions. Demonstrates safe ability to ambulate without physical assistance, tolerating higher level dynamic tasks without loss of balance. She has been scheduled to start with OPPT for a prior injury at [facility] and this will be an appropriate transition of care. See below for any follow-up Physical Therapy or equipment needs. PT is signing off. Thank you for this referral.     Follow Up Recommendations Outpatient PT(Already scheduled PTA due prior injury)    Equipment Recommendations  None recommended by PT    Recommendations for Other Services       Precautions / Restrictions Precautions Precautions: Fall Restrictions Weight Bearing Restrictions: No      Mobility  Bed Mobility Overal bed mobility: Modified Independent             General bed mobility comments: extra time  Transfers Overall transfer level: Modified independent Equipment used: None             General transfer comment: Stood without support. Minor sway noted but able to self stabilze.  Ambulation/Gait Ambulation/Gait assistance: Supervision Gait Distance (Feet): 150 Feet Assistive device: None;IV Pole Gait Pattern/deviations: Step-through pattern;Decreased stride length Gait velocity: decreased   General Gait Details: Gait slightly guarded but without overt instability, occasionally pushing IV pole. Tolerated  higher level challenges including marching, backwards steps, head turns, and EC without LOB.  Stairs            Wheelchair Mobility    Modified Rankin (Stroke Patients Only) Modified Rankin (Stroke Patients Only) Pre-Morbid Rankin Score: No symptoms Modified Rankin: No symptoms     Balance Overall balance assessment: Modified Independent                               Standardized Balance Assessment Standardized Balance Assessment : Dynamic Gait Index   Dynamic Gait Index Level Surface: Normal Change in Gait Speed: Mild Impairment Gait with Horizontal Head Turns: Normal Gait with Vertical Head Turns: Mild Impairment Gait and Pivot Turn: Normal Step Over Obstacle: Mild Impairment Step Around Obstacles: Normal Steps: Mild Impairment Total Score: 20       Pertinent Vitals/Pain Pain Assessment: Faces Faces Pain Scale: Hurts little more Pain Location: sacrum Pain Descriptors / Indicators: Aching Pain Intervention(s): Monitored during session;Repositioned    Home Living Family/patient expects to be discharged to:: Private residence Living Arrangements: Alone Available Help at Discharge: Family Type of Home: House Home Access: Ramped entrance;Stairs to enter     Home Layout: Multi-level;Able to live on main level with bedroom/bathroom;Laundry or work area in [place]: Environmental consultant - 4 wheels;Cane - single point;Transport chair;Shower seat - built in      Prior Function Level of Independence: Independent         Comments: No other falls in past 6 months.  Husband is at ALF.     Hand Dominance        Extremity/Trunk Assessment   Upper Extremity Assessment Upper Extremity Assessment: Defer  to OT evaluation    Lower Extremity Assessment Lower Extremity Assessment: Overall WFL for tasks assessed       Communication   Communication: No difficulties  Cognition Arousal/Alertness: Awake/alert Behavior During Therapy: WFL for  tasks assessed/performed Overall Cognitive Status: Within Functional Limits for tasks assessed                                        General Comments General comments (skin integrity, edema, etc.): Orthostatic recorded, no significant drop in BP. Discussed home safety, self care and home management strategies. Pt also able to toilet BM herself in restroom during therapy session, cues for navigatin with IV pole only in restroom.    Exercises     Assessment/Plan    PT Assessment Patent does not need any further PT services  PT Problem List         PT Treatment Interventions      PT Goals (Current goals can be found in the Care Plan section)  Acute Rehab PT Goals Patient Stated Goal: Go home PT Goal Formulation: All assessment and education complete, DC therapy    Frequency     Barriers to discharge        Co-evaluation               AM-PAC PT "6 Clicks" Mobility  Outcome Measure Help needed turning from your back to your side while in a flat bed without using bedrails?: None Help needed moving from lying on your back to sitting on the side of a flat bed without using bedrails?: None Help needed moving to and from a bed to a chair (including a wheelchair)?: None Help needed standing up from a chair using your arms (e.g., wheelchair or bedside chair)?: None Help needed to walk in hospital room?: A Little Help needed climbing 3-5 steps with a railing? : A Little 6 Click Score: 22    End of Session Equipment Utilized During Treatment: Gait belt Activity Tolerance: Patient tolerated treatment well Patient left: in bed;with call bell/phone within reach;with bed alarm set;with SCD's reapplied Nurse Communication: Mobility status PT Visit Diagnosis: Pain;History of falling (Z91.81) Pain - part of body: (sacrum)    Time: [id number] PT Time Calculation (min) (ACUTE ONLY): 32 min   Charges:   PT Evaluation $PT Eval Low Complexity: 1 Low PT  Treatments $Self Care/Home Management: [place] Vivianne Carles, PT   Ellouise Newer 01/19/2019, 10:31 AM

## 2019-01-19 NOTE — ED Notes (Signed)
Report given to 3W RN. All questions answered.  

## 2019-01-19 NOTE — Discharge Summary (Signed)
Physician Discharge Summary  Patient ID: TEONIA CARRIGG MRN: [id number] DOB/AGE: 10-09-1940 78 y.o.  Admit date: 01/18/2019 Discharge date: 01/19/2019  Admission Diagnoses:  Discharge Diagnoses:  Principal Problem:   Syncope Active Problems:   Hypertension   Hyperlipidemia   Hypothyroidism   GERD (gastroesophageal reflux disease)   Chronic kidney disease, stage III (moderate)   Cerebral hemorrhage (HCC)   Thrombocytopenia (HCC)   Microcytic anemia   Buttock pain   Diarrhea   Nausea & vomiting   Discharged Condition: stable  Hospital Course: Patient is a 78 year old Caucasian female with past medical history significant for hypertension, hyperlipidemia, borderline diabetes mellitus not on medication, GERD, hypothyroidism, chronic kidney disease stage III and recent intracranial hemorrhage.  Patient was admitted with syncope.  According to the patient, she was standing on the line at the post office for very long time and all of a sudden fell hot, dizzy and passed out.  Work-up done has been nonrevealing.  Patient was also volume resuscitated.  Orthostasis checked prior to discharge was negative.  Telemetry monitoring revealed sinus bradycardia.  Patient is back to her normal self and will be discharged back to the care of the primary care provider and cardiologist.  Patient will follow with a cardiologist for 1 month Holter monitoring.  Syncope:   Possibly vasovagal Imaging studies are nonrevealing.   Telemetry monitoring reveals sinus bradycardia in the 50s.   Patient will follow with the cardiology for 1 month of Holter monitoring.    Buttock pain:  -X-ray showed findings suspicious for a minimally displaced fracture of the sacrum at approximately the S4 segment.  -Patient was seen by the orthopedic team and cleared for discharge.    Nausea & vomiting:  Patient has had issues with recent nausea and vomiting.  Patient also has chronic diarrhea. Will defer further  management today primary care provider.    Hypertension: -On Irbesartan -Continue to monitor closely.  Hyperlipidemia: -On Lipitor.  -Continue to monitor  Hypothyroidism:  -Follow-up with PCP  GERD (gastroesophageal reflux disease): -On Protonix  Chronic kidney disease, stage III (moderate):  -Stable.   Recent hx of Cerebral hemorrhage [facility]):  -CT Head and MRI brain findings as documented below.   -Continue to monitor patient.    Thrombocytopenia ([place]):  -Continue to monitor closely.    Anemia:  Continue to monitor.  Will defer further care to the primary care provider.  Diarrhea:  -Chronic.   -Supportive care   Consults: orthopedic surgery  Significant Diagnostic Studies:  MRI brain revealed: 1. No acute intracranial abnormality. 2. Small posterior scalp contusion. 3. Age-related cerebral atrophy with mild chronic small vessel ischemic disease.  CT pelvis without contrast revealed: Nondisplaced buckle fracture of the S4-5 sacral elements, which is age indeterminate. No other fracture identified.  CT head without contrast revealed: 1. No acute intracranial abnormality. 2. Gliosis in the right mesial temporal lobe corresponding to the site of prior hyperdense hemorrhage with resolution of the layering intraventricular hemorrhage seen on comparison studies. 3. Right parieto-occipital scalp swelling and subgaleal hematoma without subjacent calvarial fracture. 4. Remote posttraumatic deformity of the floor of the right orbit with persistent deviation of the inferior rectus. 5. Remote fracture of the right maxillary frontal process.  Discharge Exam: Blood pressure 132/69, pulse 65, temperature 98.4 F (36.9 C), temperature source Oral, resp. rate 16, height 5\' 4"  (1.626 m), weight 68.9 kg, SpO2 94 %.  Disposition: Discharge disposition: 01-Home or Self Care  Discharge Instructions    Diet - low  sodium heart healthy   Complete by: As directed     Increase activity slowly   Complete by: As directed      Allergies as of 01/19/2019      Reactions   Short Ragweed Pollen Ext Other (See Comments)   Congestion/cough   Tape Rash, Other (See Comments)   Peels the skin      Medication List    STOP taking these medications   Biotin 1000 MCG tablet   OVER THE COUNTER MEDICATION     TAKE these medications   atorvastatin 80 MG tablet Commonly known as: LIPITOR 1 by mouth daily What changed:   how much to take  how to take this  when to take this  additional instructions   levothyroxine 75 MCG tablet Commonly known as: SYNTHROID Take 1 tablet (75 mcg total) by mouth daily.   naproxen sodium 220 MG tablet Commonly known as: ALEVE Take 220-440 mg by mouth See admin instructions. Take two tablets (440 mg) by mouth every morning, may also take one or two tablets (220-440 mg) at night as needed for pain/headache   olmesartan 40 MG tablet Commonly known as: BENICAR Take 1 tablet (40 mg total) by mouth daily.   omeprazole 40 MG capsule Commonly known as: PRILOSEC Take 1 capsule (40 mg total) by mouth daily.   VITAMIN D3 PO Take 1 capsule by mouth every morning.        SignedBonnell Public 01/19/2019, 12:56 PM
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
## 2019-01-19 NOTE — Progress Notes (Signed)
Patient discharged home. IV removed. CCMD notified. Education and instructions provided to patient. Leaving unit via wheelchair.

## 2019-01-19 NOTE — Progress Notes (Signed)
  Echocardiogram 2D Echocardiogram has been performed.  Sandra Montgomery 01/19/2019, 12:44 PM

## 2019-01-19 NOTE — Progress Notes (Signed)
Received the Pt from ED, alert and oriented, placed on Tele and verified, implemented Dr's orders, kept NPO as per Dr's orders, will continue to monitor

## 2019-01-21 ENCOUNTER — Ambulatory Visit: Payer: Medicare Other | Admitting: Cardiology

## 2019-01-21 ENCOUNTER — Telehealth: Payer: Self-pay | Admitting: Internal Medicine

## 2019-01-21 ENCOUNTER — Telehealth: Payer: Self-pay

## 2019-01-21 DIAGNOSIS — R55 Syncope and collapse: Secondary | ICD-10-CM

## 2019-01-21 LAB — PATHOLOGIST SMEAR REVIEW

## 2019-01-21 NOTE — Telephone Encounter (Signed)
I have put in the referral for the cardiologist. Telemetry did not show anything particularly worrisome--but should get the monitor on and decide about the drving

## 2019-01-21 NOTE — Telephone Encounter (Signed)
Called both numbers in the chart-home number kept ringing busy, cell phone did not have a voicemail box set up. Not able to leave a message. Will try again later.

## 2019-01-21 NOTE — Telephone Encounter (Signed)
Patient stated that [place] and was taken to [facility] by ambulance. Where she stayed over the weekend She stated that they are wanting her to see a Cardiologist ASAP, within the next day or 2 so she can get a heart monitor.  She is not able to drive until she is seen. They stated that her heart is is fluttering and that causes her black outs.   Patient would like a call back as soon as possible so we can help her get this scheduled.

## 2019-01-22 ENCOUNTER — Other Ambulatory Visit: Payer: Self-pay

## 2019-01-22 ENCOUNTER — Ambulatory Visit (INDEPENDENT_AMBULATORY_CARE_PROVIDER_SITE_OTHER): Payer: Medicare Other | Admitting: Cardiology

## 2019-01-22 ENCOUNTER — Ambulatory Visit (INDEPENDENT_AMBULATORY_CARE_PROVIDER_SITE_OTHER): Payer: Medicare Other

## 2019-01-22 ENCOUNTER — Encounter: Payer: Self-pay | Admitting: Cardiology

## 2019-01-22 VITALS — BP 172/79 | HR 92 | Ht 64.0 in | Wt 157.2 lb

## 2019-01-22 DIAGNOSIS — I10 Essential (primary) hypertension: Secondary | ICD-10-CM

## 2019-01-22 DIAGNOSIS — R55 Syncope and collapse: Secondary | ICD-10-CM

## 2019-01-22 NOTE — Telephone Encounter (Signed)
Patient's questions/concerns and FYI information:  1) Patient is not able to drive, has to find someone to bring her to the appointment, when can patient be seen for hospital f/u? Can it be virtual visit possibly? (did not see a 30 minute visit time until 10/30 2 same day slots are open)  2) Patient saw cardiologist today and she was told by the provider that he did not think patient's heart is causing the black out spells, that her heart is fine. Patient was told she should go to see ENT that it could be vertigo or inner crystal issue. Patient states that she went to [facility] about 6 to 7 years ago for inner ear crystal issue but did not have same symptoms then. Patient states she would like to get this referral set up.  3) Patient states cardiologist did not feel comfortable telling patient that she can drive, told patient it needed to come from her PCP or ENT specialist. Patient's husband is currently at [facility] and patient is sad that she is not able to go see him due to not been able to drive. Has a lot of stress with everything going on with her and his health.  Patient states she thinks that she still has some swelling behind her right nostril area from her last fall in August and she is not getting enough air and plus wearing a mask and that is why maybe she passed out the other day. Patient is just wondering why these episodes are happening.

## 2019-01-22 NOTE — Telephone Encounter (Signed)
Transition Care Management Follow-up Telephone Call   Date discharged? 01/19/2019   How have you been since you were released from the hospital? Patient states she moves very slow, does not feel good. Tailbone is hurting and has a nagging headache-has a knot on the back of her head and some bruising. Has been taking Aleve. No vomiting. No dizziness right now. When she turns around in her bed she has some vertigo. She tries to take her time with doing things.   Do you understand why you were in the hospital? yes   Do you understand the discharge instructions? yes   Where were you discharged to? Home. Patient did stay with her daughter the first night after been released from hospital.    Items Reviewed:  Medications reviewed: yes-no changes made.  Allergies reviewed: yes  Dietary changes reviewed: yes  Referrals reviewed: has appointment with cardiologist on 01/22/2019-today.   Functional Questionnaire:  Activities of Daily Living (ADLs):   She states they are independent in the following: fixing food, fixing medication, ambulation, dressing, bathing, hygiene, toileting.  States they require assistance with the following: with transportation at this time.   Any transportation issues/concerns?: no-daughter is helping patient get around if needed. Not able to drive right now.   Any patient concerns? See notes in other notes above.  Confirmed importance and date/time of follow-up visits scheduled not yet.  Confirmed with patient if condition begins to worsen call PCP or go to the ER.  Patient was given the office number and encouraged to call back with question or concerns.  : yes
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
## 2019-01-22 NOTE — Telephone Encounter (Signed)
Patient advised. Patient has an appointment with cardiologist today and is on the way now.

## 2019-01-22 NOTE — Patient Instructions (Signed)
Medication Instructions:  Your physician recommends that you continue on your current medications as directed. Please refer to the Current Medication list given to you today.  *If you need a refill on your cardiac medications before your next appointment, please call your pharmacy*  Lab Work: NONE  If you have labs (blood work) drawn today and your tests are completely normal, you will receive your results only by: Marland Kitchen MyChart Message (if you have MyChart) OR . A paper copy in the mail If you have any lab test that is abnormal or we need to change your treatment, we will call you to review the results.  Testing/Procedures: Your physician has recommended that you wear an 14 DAY ZIO event monitor. Event monitors are medical devices that record the heart's electrical activity. Doctors most often [place] these monitors to diagnose arrhythmias. Arrhythmias are problems with the speed or rhythm of the heartbeat. The monitor is a small, portable device. You can wear one while you do your normal daily activities. This is usually used to diagnose what is causing palpitations/syncope (passing out). A Zio Patch Event Heart monitor will be applied to your chest today.  You will wear the patch for 14 days. After 24 hours, you may shower with the heart monitor on. If you feel any SYMPTOMS, you may press and release the button in the middle of the monitor.  Follow-Up: At [facility], you and your health needs are our priority.  As part of our continuing mission to provide you with exceptional heart care, we have created designated Provider Care Teams.  These Care Teams include your primary Cardiologist (physician) and Advanced Practice Providers (APPs -  Physician Assistants and Nurse Practitioners) who all work together to provide you with the care you need, when you need it.  Your next appointment:   pending results of ZIO monitor.   Dr. Garen Lah will let us know if you need to schedule an appointment  after he gets the results.   The format for your next appointment:   In Person  Provider:    You may see Kate Sable, MD or one of the following Advanced Practice Providers on your designated Care Team:    Murray Hodgkins, NP  Christell Faith, PA-C  Marrianne Mood, PA-C

## 2019-01-23 NOTE — Telephone Encounter (Signed)
See if she can come tomorrow at 2 or in one of my Friday slots. (15 minutes is enough) If I think everything is okay, I can clear her to drive again (since she saw the cardiologist also) We can decide then whether ENT evaluation is needed

## 2019-01-23 NOTE — Telephone Encounter (Signed)
Patient advised and appointment made for 01/25/2019

## 2019-01-25 ENCOUNTER — Other Ambulatory Visit: Payer: Self-pay

## 2019-01-25 ENCOUNTER — Encounter: Payer: Self-pay | Admitting: Internal Medicine

## 2019-01-25 ENCOUNTER — Ambulatory Visit (INDEPENDENT_AMBULATORY_CARE_PROVIDER_SITE_OTHER): Payer: Medicare Other | Admitting: Internal Medicine

## 2019-01-25 DIAGNOSIS — R55 Syncope and collapse: Secondary | ICD-10-CM

## 2019-01-25 NOTE — Patient Instructions (Signed)
Please set up a follow up visit with [facility] to see if they can help you with the inner ear problems.

## 2019-01-25 NOTE — Assessment & Plan Note (Signed)
Episode is fairly serious but does seem to be vestibular---not seizure or arrhythmia Still occasional vertigo--has responded to Epley maneuver in the past She will go back to ENT to look into this This might be related to the August episode as well (though she may have tripped then) Discussed driving---okay to restart driving (she has an automatic blind spot system so she doesn't have to turn her head to check) Consider meclizine regularly if the vertigo persists
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
## 2019-01-31 DIAGNOSIS — R42 Dizziness and giddiness: Secondary | ICD-10-CM | POA: Diagnosis not present

## 2019-01-31 DIAGNOSIS — J301 Allergic rhinitis due to pollen: Secondary | ICD-10-CM | POA: Diagnosis not present

## 2019-01-31 DIAGNOSIS — H8111 Benign paroxysmal vertigo, right ear: Secondary | ICD-10-CM | POA: Diagnosis not present

## 2019-02-05 DIAGNOSIS — R55 Syncope and collapse: Secondary | ICD-10-CM

## 2019-02-06 DIAGNOSIS — H8111 Benign paroxysmal vertigo, right ear: Secondary | ICD-10-CM | POA: Diagnosis not present

## 2019-02-13 DIAGNOSIS — R55 Syncope and collapse: Secondary | ICD-10-CM | POA: Diagnosis not present

## 2019-02-18 ENCOUNTER — Ambulatory Visit: Payer: Medicare Other | Attending: Internal Medicine | Admitting: Physical Therapy

## 2019-02-18 ENCOUNTER — Telehealth: Payer: Self-pay | Admitting: *Deleted

## 2019-02-18 NOTE — Telephone Encounter (Signed)
Patient calling back but her cell phone. She said she was on her last battery power and that he husband had passed away this morning. She said if her phone cut off I would need to call her home number. I offered my condolences and started into explaining the results, when the phone cut off. I attempted to call her home number and left a message for her to call back when able.

## 2019-02-18 NOTE — Telephone Encounter (Signed)
-----   Message from Kate Sable, MD sent at 02/18/2019 12:08 PM EST ----- 1 run of VT lasting 7 beats, on and off SVT beats.  Patient triggered events were associated with sinus rhythm.  No significant abnormalities to explain patient's symptoms.  Otherwise benign test

## 2019-02-19 NOTE — Telephone Encounter (Signed)
Spoke with patient. She verbalized understanding of the results of the monitor. She was pleased to hear it was positive. Her f/u was pending results. She will let us know if anything changes. She is currently arranging her husband funeral who died yesterday. She will let us know if she feels anything worsens with her heart.

## 2019-02-20 ENCOUNTER — Telehealth: Payer: Self-pay | Admitting: Internal Medicine

## 2019-02-20 MED ORDER — LORAZEPAM 0.5 MG PO TABS: 0.2500 mg | ORAL_TABLET | Freq: Three times a day (TID) | ORAL | 0 refills | Status: DC | PRN

## 2019-02-20 NOTE — Telephone Encounter (Signed)
She requests a tranquilizer short term for grieving stress. Rx sent

## 2019-02-20 NOTE — Addendum Note (Signed)
Addended by: Viviana Simpler I on: 02/20/2019 01:51 PM   Modules accepted: Orders

## 2019-02-20 NOTE — Telephone Encounter (Signed)
Spoke to her and offered my condolences

## 2019-02-20 NOTE — Telephone Encounter (Signed)
error 

## 2019-02-20 NOTE — Telephone Encounter (Signed)
Patient called today stating that her husband did pass away. She knew that you had been following up on him and she stated that she felt like you needed to know that he passed  Her Husband Glendell Docker was put in hospice care on Sunday @ 2:30 and July 25, 2022 @ 2:30 he passed away

## 2019-02-21 ENCOUNTER — Ambulatory Visit: Payer: Medicare Other | Admitting: Physical Therapy

## 2019-02-25 ENCOUNTER — Encounter: Payer: Medicare Other | Admitting: Physical Therapy

## 2019-03-04 ENCOUNTER — Encounter: Payer: Medicare Other | Admitting: Physical Therapy

## 2019-03-05 ENCOUNTER — Ambulatory Visit: Payer: Medicare Other | Admitting: Physical Therapy

## 2019-03-05 ENCOUNTER — Ambulatory Visit: Payer: Medicare Other | Attending: Internal Medicine | Admitting: Physical Therapy

## 2019-03-05 ENCOUNTER — Encounter: Payer: Self-pay | Admitting: Physical Therapy

## 2019-03-05 ENCOUNTER — Other Ambulatory Visit: Payer: Self-pay

## 2019-03-05 DIAGNOSIS — M6281 Muscle weakness (generalized): Secondary | ICD-10-CM | POA: Insufficient documentation

## 2019-03-05 DIAGNOSIS — R2681 Unsteadiness on feet: Secondary | ICD-10-CM | POA: Insufficient documentation

## 2019-03-05 DIAGNOSIS — Z9181 History of falling: Secondary | ICD-10-CM

## 2019-03-06 NOTE — Therapy (Signed)
[place] MAIN [facility] SERVICES [address] [place], [place], [address] Phone: [phone]   Fax:  [phone]  Physical Therapy Evaluation  Patient Details  Name: PRESLI HEPPLER MRN: [id number] Date of Birth: December 13, 1940 Referring Provider (PT): Viviana Simpler MD (PCP)   Encounter Date: 03/05/2019  PT End of Session - 03/06/19 1011    Visit Number  1    Number of Visits  9    Date for PT Re-Evaluation  05/01/19    PT Start Time  [id number]    PT Stop Time  1531    PT Time Calculation (min)  59 min    Equipment Utilized During Treatment  Gait belt    Activity Tolerance  Patient tolerated treatment well    Behavior During Therapy  [facility] for tasks assessed/performed       Past Medical History:  Diagnosis Date  . Allergic rhinitis, cause unspecified   . Cancer (HCC)    shoulder carcinoma  . Diabetes mellitus without complication ([place])   . GERD (gastroesophageal reflux disease)   . History of blood transfusion   . Hyperlipidemia   . Hypertension   . Impaired fasting glucose   . Osteoarthrosis involving, or with mention of more than one site, but not specified as generalized, multiple sites   . Osteoporosis   . Pneumothorax 1965   prolonged bed rest   . Unspecified hypothyroidism     Past Surgical History:  Procedure Laterality Date  . ABDOMINAL HYSTERECTOMY  1981  . BREAST EXCISIONAL BIOPSY Left years ago   x 2. Negative.   Marland Kitchen BREAST SURGERY  1985   non cancerous mass removed  . CATARACT EXTRACTION W/PHACO Left 04/26/2017   Procedure: CATARACT EXTRACTION PHACO AND INTRAOCULAR LENS PLACEMENT ([place])  LEFT;  Surgeon: Leandrew Koyanagi, MD;  Location: [place];  Service: Ophthalmology;  Laterality: Left;  Diabetic - oral meds  . CATARACT EXTRACTION W/PHACO Right 05/24/2017   Procedure: CATARACT EXTRACTION PHACO AND INTRAOCULAR LENS PLACEMENT ([place]) RIGHT DIABETIC;  Surgeon: Leandrew Koyanagi, MD;  Location: [place];  Service: Ophthalmology;  Laterality: Right;  Diabetic - oral meds  . COLONOSCOPY    . COLONOSCOPY WITH PROPOFOL N/A 07/18/2016   Procedure: COLONOSCOPY WITH PROPOFOL;  Surgeon: Manya Silvas, MD;  Location: [facility];  Service: Endoscopy;  Laterality: N/A;  . ESOPHAGOGASTRODUODENOSCOPY    . TONSILLECTOMY AND ADENOIDECTOMY  1961    There were no vitals filed for this visit.   Subjective Assessment - 03/05/19 1438    Subjective  "I really think that if I didn't have to wear this mask, that I probably wouldn't have fallen."    Pertinent History  78 yo Female referred to outpatient PT for unsteady gait/balance. She has experienced 2 falls both as a result of syncope episode. In August 2020, patient fell forward at [employer] and suffered a fractured face, bruised ribs, etc. She was taken to hospital; Patient then discharged home. In October 2020 she was in line at the post office and then passed out. She is concerned that wearing a mask is limiting her oxygen which could be contributing to loss of balance/syncope. medical history significant of hypertension, hyperlipidemia, borderline diabetes not taking medications, GERD, hypothyroidism, shoulder carcinoma, LAD, recent ICH (11/25/2018), CKD stage III. Her husband recently passed a week ago. She is currently not using any assistive device. She denies any unsteadiness with most walking, but states that her falls have occured with syncope. She is  unsure of cause of syncope. She has been evaluated by ENT and was diagnosed with a deviated septum. They said that they could do surgery but didn't feel that it was severe enough to recommend surgery. She was assessed for vertigo which was negative. She reports feeling some episodes of room spinning when she has been to the beauty shop but states otherwise no symptoms of vertigo. She feels that her syncope could be coming from lack of oxygen with wearing a mask.    Limitations  Standing;Walking     How long can you sit comfortably?  Does have some pain with prolonged sitting from a recent fractured coccyx, but she sits on a donut pillow with good tolerance;    How long can you stand comfortably?  NA    How long can you walk comfortably?  able to walk as long as needed, but unable to move as quickly as she used to    Diagnostic tests  MRI in October 2020 shows no acute changes, Small posterior scalp contusion.    Patient Stated Goals  Being able to walk as quickly as she used to, being able to just get up out of a chair without having to push up with arms.    Currently in Pain?  No/denies    Multiple Pain Sites  No         [facility] PT Assessment - 03/06/19 0001      Assessment   Medical Diagnosis  unsteady gait    Referring Provider (PT)  Viviana Simpler MD (PCP)    Onset Date/Surgical Date  11/03/18    Hand Dominance  Right    Next MD Visit  April 01, 2019    Prior Therapy  denies any PT for this condition      Precautions   Precautions  Fall      Restrictions   Weight Bearing Restrictions  No      Balance Screen   Has the patient fallen in the past 6 months  Yes    How many times?  2    Has the patient had a decrease in activity level because of a fear of falling?   Yes    Is the patient reluctant to leave their home because of a fear of falling?   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, Software engineer    Leisure  watch TV, go out to eat, gone to [employer],  travel      Cognition   Overall Cognitive Status  Within Functional Limits for tasks assessed      Observation/Other Assessments   Observations  very pleasant woman, sits with mask below nose most of the time    Activities of Balance Confidence Scale (ABC Scale)   66.25%       Sensation   Light  Touch  Appears Intact    Proprioception  Appears Intact      Coordination   Gross Motor Movements are Fluid and Coordinated  Yes    Fine Motor Movements are Fluid and Coordinated  Yes      Posture/Postural Control   Posture Comments  WFL      Strength   Right Hip Flexion  4+/5    Right Hip ABduction  4/5    Right Hip ADduction  4/5    Left Hip Flexion  4+/5    Left Hip ABduction  4/5    Left Hip ADduction  4/5    Right Knee Flexion  5/5    Right Knee Extension  5/5    Left Knee Flexion  5/5    Left Knee Extension  5/5    Right Ankle Dorsiflexion  5/5    Left Ankle Dorsiflexion  5/5      Transfers   Comments  able to transfer mod I without pushing on arm rests but requires increased time      Ambulation/Gait   Gait Comments  ambulates without AD, reciprocal gait pattern, slightly slower gait speed, good foot clearance and good balance      6 Minute Walk- Baseline   6 Minute Walk- Baseline  yes    BP (mmHg)  140/65    HR (bpm)  81    02 Sat (%RA)  97 %    Modified Borg Scale for Dyspnea  0- Nothing at all      6 Minute walk- Post Test   6 Minute Walk Post Test  yes    BP (mmHg)  194/63    HR (bpm)  113    02 Sat (%RA)  98 %    Modified Borg Scale for Dyspnea  4- somewhat severe      6 minute walk test results    Aerobic Endurance Distance Walked  1175    Endurance additional comments  less than age group norms of >1500 feet, patient is a Investment banker, operational   Five times sit to stand comments   17.03 sec without pushing on arm rests of chair (>15 sec indicates high fall risk)      Timed Up and Go Test   Normal TUG (seconds)  12     TUG Comments  without AD, low fall risk      High Level Balance   High Level Balance Comments  standing with feet together, eyes open, no imbalance, eyes closed, no imbalance; tandem stance eyes open: unsteady requiring CGA, eyes closed immediate loss of balance                Objective measurements completed on examination: See above findings.              PT Education - 03/06/19 1010    Education Details  recommendation/POC    Person(s) Educated  Patient    Methods  Explanation    Comprehension  Verbalized understanding       PT Short Term Goals - 03/06/19 1031      PT SHORT TERM GOAL #1   Title  Patient will be adherent to HEP at least 3x a week to improve functional strength and balance for better safety at home.    Time  4    Period  Weeks    Status  New    Target Date  04/03/19  PT SHORT TERM GOAL #2   Title  Patient (> 68 years old) will complete five times sit to stand test in < 15 seconds indicating an increased LE strength and improved balance.    Time  4    Period  Weeks    Status  New    Target Date  04/03/19        PT Long Term Goals - 03/06/19 1032      PT LONG TERM GOAL #1   Title  Patient will increase six minute walk test distance to >1300 for progression to community ambulator and improve gait ability closer to age group norms.    Time  8    Period  Weeks    Status  New    Target Date  05/01/19      PT LONG TERM GOAL #2   Title  Patient will increase Functional Gait Assessment score to >20/30 as to reduce fall risk and improve dynamic gait safety with community ambulation.    Time  8    Period  Weeks    Status  New    Target Date  05/01/19      PT LONG TERM GOAL #3   Title  Patient will tolerate 5 seconds of single leg stance without loss of balance to improve ability to get in and out of shower safely.    Time  8    Period  Weeks    Status  New    Target Date  05/01/19      PT LONG TERM GOAL #4   Title  Patient  will increase BLE gross strength to 4+/5 as to improve functional strength for independent gait, increased standing tolerance and increased ADL ability.    Time  8    Period  Weeks    Status  New    Target Date  05/01/19             Plan - 03/06/19 1013    Clinical Impression Statement  78 yo Female presents to therapy for imbalance and unsteady gait. She has experienced 2 major falls in last few months as a result of syncope. Unsure of cause of syncope but patient feels it could be related to poor oxygen intake with mask wear. Patient does ambulate with slower gait speed. Instructed patietn in 6 min walk; She exhibits a slower gait speed on 6 min walk compared to age group norms. Patient did exhibit a significant increase in BP and HR which could have contributed to shortness of breath. Her SPo2 levels were normal despite shortness of breath. Patient did test as a high fall risk. She has increased difficutly standing with narrow base of support with unsteadiness with tandem stance.Patient would benefit from additional skilled PT Intervention to improve strength, balance and mobility;    Personal Factors and Comorbidities  Age;Comorbidity 3+    Comorbidities  medical history significant of hypertension, hyperlipidemia, borderline diabetes not taking medications, GERD, hypothyroidism, shoulder carcinoma, LAD, recent ICH (11/25/2018), CKD stage III.    Examination-Activity Limitations  Caring for Others;Carry;Lift;Locomotion Level;Squat;Stairs;Stand;Transfers    Examination-Participation Restrictions  Cleaning;Community Activity;Shop;Volunteer;Yard Work    Merchant navy officer  Evolving/Moderate complexity    Clinical Decision Making  Moderate    Rehab Potential  Good    PT Frequency  1x / week    PT Duration  8 weeks    PT Treatment/Interventions  Cryotherapy;Moist Heat;Gait training;Stair training;Functional mobility training;Therapeutic activities;Therapeutic exercise;Balance  training;Neuromuscular re-education;Patient/family education;Energy conservation;Vestibular  PT Next Visit Plan  address HEP/balance/strengthening    PT Home Exercise Plan  will address next session;    Consulted and Agree with Plan of Care  Patient       Patient will benefit from skilled therapeutic intervention in order to improve the following deficits and impairments:  Abnormal gait, Decreased balance, Decreased endurance, Decreased mobility, Difficulty walking, Cardiopulmonary status limiting activity, Decreased activity tolerance, Decreased strength  Visit Diagnosis: Muscle weakness (generalized)  Unsteadiness on feet  History of falling     Problem List Patient Active Problem List   Diagnosis Date Noted  . Syncope 01/18/2019  . Thrombocytopenia ([place]) 01/18/2019  . Microcytic anemia 01/18/2019  . Buttock pain 01/18/2019  . Diarrhea 01/18/2019  . Nausea & vomiting 01/18/2019  . Closed minimally displaced zone II fracture of sacrum ([place])   . Vertigo 12/05/2018  . Right orbit fracture ([place]) 12/05/2018  . Cerebral hemorrhage ([place]) 11/26/2018  . Actinic keratosis 05/03/2018  . Chronic kidney disease, stage III (moderate) 03/29/2018  . Advanced directives, counseling/discussion 02/06/2014  . Routine general medical examination at a health care facility 07/27/2012  . Osteoporosis   . Hypertension   . Hyperlipidemia   . Allergic rhinitis, cause unspecified   . Hypothyroidism   . Prediabetes   . Osteoarthritis, multiple sites   . GERD (gastroesophageal reflux disease)     Himani Corona PT, DPT 03/06/2019, 10:35 AM  [place] MAIN [facility] SERVICES [address] [place], [place], [address] Phone: [phone]   Fax:  [phone]  Name: ITZEL KONICKI MRN: [id number] Date of Birth: 1940-09-16

## 2019-03-07 ENCOUNTER — Ambulatory Visit: Payer: Medicare Other | Admitting: Physical Therapy

## 2019-03-11 ENCOUNTER — Encounter: Payer: Medicare Other | Admitting: Physical Therapy

## 2019-03-12 ENCOUNTER — Encounter: Payer: Self-pay | Admitting: Physical Therapy

## 2019-03-12 ENCOUNTER — Other Ambulatory Visit: Payer: Self-pay

## 2019-03-12 ENCOUNTER — Ambulatory Visit: Payer: Medicare Other | Admitting: Physical Therapy

## 2019-03-12 DIAGNOSIS — M6281 Muscle weakness (generalized): Secondary | ICD-10-CM

## 2019-03-12 DIAGNOSIS — Z9181 History of falling: Secondary | ICD-10-CM

## 2019-03-12 DIAGNOSIS — R2681 Unsteadiness on feet: Secondary | ICD-10-CM

## 2019-03-12 NOTE — Therapy (Signed)
[place] MAIN [facility] SERVICES [address] [place], [place], [address] Phone: [phone]   Fax:  [phone]  Physical Therapy Treatment  Patient Details  Name: Sandra Montgomery MRN: [id number] Date of Birth: Sep 08, 1940 Referring Provider (PT): Viviana Simpler MD (PCP)   Encounter Date: 03/12/2019  PT End of Session - 03/12/19 1451    Visit Number  2    Number of Visits  9    Date for PT Re-Evaluation  05/01/19    PT Start Time  [id number]    PT Stop Time  1530    PT Time Calculation (min)  44 min    Equipment Utilized During Treatment  Gait belt    Activity Tolerance  Patient tolerated treatment well    Behavior During Therapy  [facility] for tasks assessed/performed       Past Medical History:  Diagnosis Date  . Allergic rhinitis, cause unspecified   . Cancer (HCC)    shoulder carcinoma  . Diabetes mellitus without complication ([place])   . GERD (gastroesophageal reflux disease)   . History of blood transfusion   . Hyperlipidemia   . Hypertension   . Impaired fasting glucose   . Osteoarthrosis involving, or with mention of more than one site, but not specified as generalized, multiple sites   . Osteoporosis   . Pneumothorax 1965   prolonged bed rest   . Unspecified hypothyroidism     Past Surgical History:  Procedure Laterality Date  . ABDOMINAL HYSTERECTOMY  1981  . BREAST EXCISIONAL BIOPSY Left years ago   x 2. Negative.   Marland Kitchen BREAST SURGERY  1985   non cancerous mass removed  . CATARACT EXTRACTION W/PHACO Left 04/26/2017   Procedure: CATARACT EXTRACTION PHACO AND INTRAOCULAR LENS PLACEMENT ([place])  LEFT;  Surgeon: Leandrew Koyanagi, MD;  Location: [place];  Service: Ophthalmology;  Laterality: Left;  Diabetic - oral meds  . CATARACT EXTRACTION W/PHACO Right 05/24/2017   Procedure: CATARACT EXTRACTION PHACO AND INTRAOCULAR LENS PLACEMENT ([place]) RIGHT DIABETIC;  Surgeon: Leandrew Koyanagi, MD;  Location: [place];  Service: Ophthalmology;  Laterality: Right;  Diabetic - oral meds  . COLONOSCOPY    . COLONOSCOPY WITH PROPOFOL N/A 07/18/2016   Procedure: COLONOSCOPY WITH PROPOFOL;  Surgeon: Manya Silvas, MD;  Location: [facility] ENDOSCOPY;  Service: Endoscopy;  Laterality: N/A;  . ESOPHAGOGASTRODUODENOSCOPY    . TONSILLECTOMY AND ADENOIDECTOMY  1961    There were no vitals filed for this visit.  Subjective Assessment - 03/12/19 1449    Subjective  Patient reports continued left knee pain which limits her mobility tolerance. She denies any new falls. "I'm sorry I'm late today. I got stuck in traffic."    Pertinent History  78 yo Female referred to outpatient PT for unsteady gait/balance. She has experienced 2 falls both as a result of syncope episode. In August 2020, patient fell forward at [employer] and suffered a fractured face, bruised ribs, etc. She was taken to hospital; Patient then discharged home. In October 2020 she was in line at the post office and then passed out. She is concerned that wearing a mask is limiting her oxygen which could be contributing to loss of balance/syncope. medical history significant of hypertension, hyperlipidemia, borderline diabetes not taking medications, GERD, hypothyroidism, shoulder carcinoma, LAD, recent ICH (11/25/2018), CKD stage III. Her husband recently passed a week ago. She is currently not using any assistive device. She denies any unsteadiness with most walking, but states that her  falls have occured with syncope. She is unsure of cause of syncope. She has been evaluated by ENT and was diagnosed with a deviated septum. They said that they could do surgery but didn't feel that it was severe enough to recommend surgery. She was assessed for vertigo which was negative. She reports feeling some episodes of room spinning when she has been to the beauty shop but states otherwise no symptoms of vertigo. She feels that her syncope could be coming from lack of oxygen  with wearing a mask.    Limitations  Standing;Walking    How long can you sit comfortably?  Does have some pain with prolonged sitting from a recent fractured coccyx, but she sits on a donut pillow with good tolerance;    How long can you stand comfortably?  NA    How long can you walk comfortably?  able to walk as long as needed, but unable to move as quickly as she used to    Diagnostic tests  MRI in October 2020 shows no acute changes, Small posterior scalp contusion.    Patient Stated Goals  Being able to walk as quickly as she used to, being able to just get up out of a chair without having to push up with arms.    Currently in Pain?  Yes    Pain Score  8     Pain Location  Knee    Pain Orientation  Left    Pain Descriptors / Indicators  Sore    Pain Type  Chronic pain    Pain Onset  More than a month ago    Pain Frequency  Intermittent    Aggravating Factors   worse with movement    Pain Relieving Factors  rest    Effect of Pain on Daily Activities  decreased activity tolerance;    Multiple Pain Sites  No          TREATMENT: Warm up on Nustep BUE/BLE level 2 x4 min (Unbilled);  Standing with red tband around BLE: -hip extension x15 -hip flexion x15 -hip abduction x15 Side stepping x10 feet x2 laps Patient required min-moderate verbal/tactile cues for correct exercise technique including cues to avoid trunk lean and keep knee straight for better hip strengthening;   Patient able to don/doff tband well with good safety awareness;     NMR: Instructed patient in balance exercise as part of HEP: -staggered stance on firm surface:  Eyes open 30 sec hold x1 set each foot in front;'  Eyes open head turns side/side x10 reps each foot in front  Eyes open head turns up/down x10 reps each foot in front;   Eyes closed 10 sec hold x1 rep each foot in front; Patient able to complete exercise without rail assist with supervision for safety and min VCS for proper foot position to  challenge stance control; Also required min VCs to slow down head movement for less unsteadiness  Resisted walking 17.5# forward/backward, side/side 4 way x2 laps each with min A for safety and cues to slow down LE movement for better dynamic control;  Standing on airex foam: -feet together  BUE ball pass side/side x10 reps  Ball tosses x10 reps  Eyes open/closed 10 sec hold x3 reps each;  Required CGA for safety;   Response to treatment: Tolerated well. She does report mild fatigue with advanced exercise. Patient able to exhibit good safety awareness and good stance control with balance exercise for HEP; She does require CGA to  min A with some balance exercises especially with resistance/compliant surfaces;                     PT Education - 03/12/19 1451    Education Details  LE strengthening, balance, HEP    Person(s) Educated  Patient    Methods  Explanation;Verbal cues    Comprehension  Verbalized understanding;Returned demonstration;Verbal cues required;Need further instruction       PT Short Term Goals - 03/06/19 1031      PT SHORT TERM GOAL #1   Title  Patient will be adherent to HEP at least 3x a week to improve functional strength and balance for better safety at home.    Time  4    Period  Weeks    Status  New    Target Date  04/03/19      PT SHORT TERM GOAL #2   Title  Patient (> 41 years old) will complete five times sit to stand test in < 15 seconds indicating an increased LE strength and improved balance.    Time  4    Period  Weeks    Status  New    Target Date  04/03/19        PT Long Term Goals - 03/06/19 1032      PT LONG TERM GOAL #1   Title  Patient will increase six minute walk test distance to >1300 for progression to community ambulator and improve gait ability closer to age group norms.    Time  8    Period  Weeks    Status  New    Target Date  05/01/19      PT LONG TERM GOAL #2   Title  Patient will increase Functional Gait  Assessment score to >20/30 as to reduce fall risk and improve dynamic gait safety with community ambulation.    Time  8    Period  Weeks    Status  New    Target Date  05/01/19      PT LONG TERM GOAL #3   Title  Patient will tolerate 5 seconds of single leg stance without loss of balance to improve ability to get in and out of shower safely.    Time  8    Period  Weeks    Status  New    Target Date  05/01/19      PT LONG TERM GOAL #4   Title  Patient will increase BLE gross strength to 4+/5 as to improve functional strength for independent gait, increased standing tolerance and increased ADL ability.    Time  8    Period  Weeks    Status  New    Target Date  05/01/19            Plan - 03/12/19 1508    Clinical Impression Statement  Patient motivated and participated well within session;Instructed patient in advanced LE strengthening exercise as part of HEP. Patient was able to don/doff tband well with good safety. Instructed patient in advanced balance tasks. She did have difficulty with narrow base of support  especially with head turns. Patient instructed in advanced balance exercise utilizing compliant surface to challenge stance control. She requires CGA for safety. Patient exhibits decreased ankle strategies often relying on hip/knee for reaction strategies for loss of balance. He would benefit from additional skilled PT intervention to improve strength, balance and gait safety;    Personal Factors and Comorbidities  Age;Comorbidity  3+    Comorbidities  medical history significant of hypertension, hyperlipidemia, borderline diabetes not taking medications, GERD, hypothyroidism, shoulder carcinoma, LAD, recent ICH (11/25/2018), CKD stage III.    Examination-Activity Limitations  Caring for Others;Carry;Lift;Locomotion Level;Squat;Stairs;Stand;Transfers    Examination-Participation Restrictions  Cleaning;Community Activity;Shop;Volunteer;Yard Work    Multimedia programmer  Evolving/Moderate complexity    Rehab Potential  Good    PT Frequency  1x / week    PT Duration  8 weeks    PT Treatment/Interventions  Cryotherapy;Moist Heat;Gait training;Stair training;Functional mobility training;Therapeutic activities;Therapeutic exercise;Balance training;Neuromuscular re-education;Patient/family education;Energy conservation;Vestibular    PT Next Visit Plan  address HEP/balance/strengthening    PT Home Exercise Plan  will address next session;    Consulted and Agree with Plan of Care  Patient       Patient will benefit from skilled therapeutic intervention in order to improve the following deficits and impairments:  Abnormal gait, Decreased balance, Decreased endurance, Decreased mobility, Difficulty walking, Cardiopulmonary status limiting activity, Decreased activity tolerance, Decreased strength  Visit Diagnosis: Muscle weakness (generalized)  Unsteadiness on feet  History of falling     Problem List Patient Active Problem List   Diagnosis Date Noted  . Syncope 01/18/2019  . Thrombocytopenia ([place]) 01/18/2019  . Microcytic anemia 01/18/2019  . Buttock pain 01/18/2019  . Diarrhea 01/18/2019  . Nausea & vomiting 01/18/2019  . Closed minimally displaced zone II fracture of sacrum ([place])   . Vertigo 12/05/2018  . Right orbit fracture ([place]) 12/05/2018  . Cerebral hemorrhage ([place]) 11/26/2018  . Actinic keratosis 05/03/2018  . Chronic kidney disease, stage III (moderate) 03/29/2018  . Advanced directives, counseling/discussion 02/06/2014  . Routine general medical examination at a health care facility 07/27/2012  . Osteoporosis   . Hypertension   . Hyperlipidemia   . Allergic rhinitis, cause unspecified   . Hypothyroidism   . Prediabetes   . Osteoarthritis, multiple sites   . GERD (gastroesophageal reflux disease)     Versia Mignogna PT, DPT 03/12/2019, 3:46 PM  [place] MAIN [facility] SERVICES [address] [place], [place], [address] Phone: [phone]   Fax:  [phone]  Name: Sandra Montgomery MRN: [id number] Date of Birth: March 24, 1941

## 2019-03-14 ENCOUNTER — Ambulatory Visit: Payer: Medicare Other | Admitting: Physical Therapy

## 2019-03-18 ENCOUNTER — Encounter: Payer: Medicare Other | Admitting: Physical Therapy

## 2019-03-19 ENCOUNTER — Other Ambulatory Visit: Payer: Self-pay

## 2019-03-19 ENCOUNTER — Ambulatory Visit: Payer: Medicare Other | Admitting: Physical Therapy

## 2019-03-19 ENCOUNTER — Encounter: Payer: Self-pay | Admitting: Physical Therapy

## 2019-03-19 DIAGNOSIS — Z9181 History of falling: Secondary | ICD-10-CM

## 2019-03-19 DIAGNOSIS — R2681 Unsteadiness on feet: Secondary | ICD-10-CM

## 2019-03-19 DIAGNOSIS — M6281 Muscle weakness (generalized): Secondary | ICD-10-CM

## 2019-03-19 NOTE — Therapy (Signed)
[place] MAIN [facility] SERVICES [address] [place], [place], [address] Phone: [phone]   Fax:  [phone]  Physical Therapy Treatment  Patient Details  Name: Sandra Montgomery MRN: [id number] Date of Birth: [birth year] Referring Provider (PT): Viviana Simpler MD (PCP)   Encounter Date: 03/19/2019  PT End of Session - 03/19/19 1349    Visit Number  3    Number of Visits  9    Date for PT Re-Evaluation  05/01/19    PT Start Time  1347    PT Stop Time  1430    PT Time Calculation (min)  43 min    Equipment Utilized During Treatment  Gait belt    Activity Tolerance  Patient tolerated treatment well    Behavior During Therapy  [facility] for tasks assessed/performed       Past Medical History:  Diagnosis Date  . Allergic rhinitis, cause unspecified   . Cancer (HCC)    shoulder carcinoma  . Diabetes mellitus without complication ([place])   . GERD (gastroesophageal reflux disease)   . History of blood transfusion   . Hyperlipidemia   . Hypertension   . Impaired fasting glucose   . Osteoarthrosis involving, or with mention of more than one site, but not specified as generalized, multiple sites   . Osteoporosis   . Pneumothorax 1965   prolonged bed rest   . Unspecified hypothyroidism     Past Surgical History:  Procedure Laterality Date  . ABDOMINAL HYSTERECTOMY  1981  . BREAST EXCISIONAL BIOPSY Left years ago   x 2. Negative.   Marland Kitchen BREAST SURGERY  1985   non cancerous mass removed  . CATARACT EXTRACTION W/PHACO Left 04/26/2017   Procedure: CATARACT EXTRACTION PHACO AND INTRAOCULAR LENS PLACEMENT ([place])  LEFT;  Surgeon: Leandrew Koyanagi, MD;  Location: [place];  Service: Ophthalmology;  Laterality: Left;  Diabetic - oral meds  . CATARACT EXTRACTION W/PHACO Right 05/24/2017   Procedure: CATARACT EXTRACTION PHACO AND INTRAOCULAR LENS PLACEMENT ([place]) RIGHT DIABETIC;  Surgeon: Leandrew Koyanagi, MD;  Location: [place];  Service: Ophthalmology;  Laterality: Right;  Diabetic - oral meds  . COLONOSCOPY    . COLONOSCOPY WITH PROPOFOL N/A 07/18/2016   Procedure: COLONOSCOPY WITH PROPOFOL;  Surgeon: Manya Silvas, MD;  [facility];  Service: Endoscopy;  Laterality: N/A;  . ESOPHAGOGASTRODUODENOSCOPY    . TONSILLECTOMY AND ADENOIDECTOMY  1961    There were no vitals filed for this visit.  Subjective Assessment - 03/19/19 1400    Subjective  Patient reports increased left knee pain and back pain which could be related to bad weather. She denies any new falls. She reports working on [employer] but states she is worried that the band exercise pulls on her knee and she "shouldn't be doing that."    Pertinent History  78 yo Female referred to outpatient PT for unsteady gait/balance. She has experienced 2 falls both as a result of syncope episode. In August 2020, patient fell forward at [employer] and suffered a fractured face, bruised ribs, etc. She was taken to hospital; Patient then discharged home. In October 2020 she was in line at the post office and then passed out. She is concerned that wearing a mask is limiting her oxygen which could be contributing to loss of balance/syncope. medical history significant of hypertension, hyperlipidemia, borderline diabetes not taking medications, GERD, hypothyroidism, shoulder carcinoma, LAD, recent ICH (11/25/2018), CKD stage III. Her husband recently passed a week ago.  She is currently not using any assistive device. She denies any unsteadiness with most walking, but states that her falls have occured with syncope. She is unsure of cause of syncope. She has been evaluated by ENT and was diagnosed with a deviated septum. They said that they could do surgery but didn't feel that it was severe enough to recommend surgery. She was assessed for vertigo which was negative. She reports feeling some episodes of room spinning when she has been to the beauty shop but states  otherwise no symptoms of vertigo. She feels that her syncope could be coming from lack of oxygen with wearing a mask.    Limitations  Standing;Walking    How long can you sit comfortably?  Does have some pain with prolonged sitting from a recent fractured coccyx, but she sits on a donut pillow with good tolerance;    How long can you stand comfortably?  NA    How long can you walk comfortably?  able to walk as long as needed, but unable to move as quickly as she used to    Diagnostic tests  MRI in October 2020 shows no acute changes, Small posterior scalp contusion.    Patient Stated Goals  Being able to walk as quickly as she used to, being able to just get up out of a chair without having to push up with arms.    Currently in Pain?  Yes    Pain Score  8     Pain Location  Knee    Pain Orientation  Left    Pain Descriptors / Indicators  Aching    Pain Type  Chronic pain    Pain Onset  More than a month ago    Pain Frequency  Intermittent    Aggravating Factors   worse with movement    Pain Relieving Factors  rest    Effect of Pain on Daily Activities  decreased activity tolerance;    Multiple Pain Sites  No                       TREATMENT:  Patient instructed in advanced balance exercise  Standing in parallel bars:  Standing on airex foam: -Heel raises 3 sec hold x10 reps with rail assist for balance -modified tandem stance:   head turns side/side x5 reps each foot in front;  Head turns up/down x5 reps each foot in front  Eyes open/closed 15 sec hold x2 reps each Patient required min VCs for balance stability, including to increase trunk control for less loss of balance with smaller base of support; Required CGA to min A for safety;   Standing on 1/2 foam: (Flat side up) -heel/toe rocks with feet apart heel/toe rocks x15 with rail assist for safety -feet apart, alternate UE lift x5 reps each  -tandem stance with 2-0 rail assist 10 sec hold x2 reps each foot in  front with CGA to min A for safety and cues to improve erect posture and increase weight shift for better stance control   Weaving around cones #6 x4 laps with supervision for safety; required min VCs to increase step length and increase turn for better cone negotiation Side stepping over cones #6 x2 lap each direction with CGA for safety and mod VCs to increase step length and increase hip flexion for better foot clearance    Gait outside in hallway: Forward walking with head turns up/down, side/side x100 feet each Forward/backward walking directional change x100  feet Patient required min VCs to increase step length and improve gait speed especially with dual task such as toss/catch ball. Required cues to improve gaze stabilization with head turns to reduce dizziness and reduce veering side/side.    Response to treatment: Tolerated fair. She reported increased left knee pain and back pain with prolonged standing. Required intermittent seated rest breaks. Patient did have difficulty with compliant surfaces with decreased stance control requiring CGA to min A for safety;                        PT Education - 03/19/19 1349    Education Details  LE strength, balance, HEP    Person(s) Educated  Patient    Methods  Explanation;Verbal cues    Comprehension  Verbalized understanding;Returned demonstration;Verbal cues required;Need further instruction       PT Short Term Goals - 03/06/19 1031      PT SHORT TERM GOAL #1   Title  Patient will be adherent to HEP at least 3x a week to improve functional strength and balance for better safety at home.    Time  4    Period  Weeks    Status  New    Target Date  04/03/19      PT SHORT TERM GOAL #2   Title  Patient (> 14 years old) will complete five times sit to stand test in < 15 seconds indicating an increased LE strength and improved balance.    Time  4    Period  Weeks    Status  New    Target Date  04/03/19         PT Long Term Goals - 03/06/19 1032      PT LONG TERM GOAL #1   Title  Patient will increase six minute walk test distance to >1300 for progression to community ambulator and improve gait ability closer to age group norms.    Time  8    Period  Weeks    Status  New    Target Date  05/01/19      PT LONG TERM GOAL #2   Title  Patient will increase Functional Gait Assessment score to >20/30 as to reduce fall risk and improve dynamic gait safety with community ambulation.    Time  8    Period  Weeks    Status  New    Target Date  05/01/19      PT LONG TERM GOAL #3   Title  Patient will tolerate 5 seconds of single leg stance without loss of balance to improve ability to get in and out of shower safely.    Time  8    Period  Weeks    Status  New    Target Date  05/01/19      PT LONG TERM GOAL #4   Title  Patient will increase BLE gross strength to 4+/5 as to improve functional strength for independent gait, increased standing tolerance and increased ADL ability.    Time  8    Period  Weeks    Status  New    Target Date  05/01/19            Plan - 03/19/19 1619    Clinical Impression Statement  Patient tolerated session fair. She reports increased LLE knee pain and back pain throughout most of session. This is likely related to cold weather and arthritis. Instructed patient in advanced balance exercise  utilizing compliant surfaces to challenge stance control. Patient exhibits poor ankle strategies often reaching for rail assist for balance recovery. She requires CGA to min A with balance exercise. Reinforced HEP. She would benefit from additional skilled PT intervention to improve strength, balance and gait safety;    Personal Factors and Comorbidities  Age;Comorbidity 3+    Comorbidities  medical history significant of hypertension, hyperlipidemia, borderline diabetes not taking medications, GERD, hypothyroidism, shoulder carcinoma, LAD, recent ICH (11/25/2018), CKD stage  III.    Examination-Activity Limitations  Caring for Others;Carry;Lift;Locomotion Level;Squat;Stairs;Stand;Transfers    Examination-Participation Restrictions  Cleaning;Community Activity;Shop;Volunteer;Yard Work    Merchant navy officer  Evolving/Moderate complexity    Rehab Potential  Good    PT Frequency  1x / week    PT Duration  8 weeks    PT Treatment/Interventions  Cryotherapy;Moist Heat;Gait training;Stair training;Functional mobility training;Therapeutic activities;Therapeutic exercise;Balance training;Neuromuscular re-education;Patient/family education;Energy conservation;Vestibular    PT Next Visit Plan  address HEP/balance/strengthening    PT Home Exercise Plan  will address next session;    Consulted and Agree with Plan of Care  Patient       Patient will benefit from skilled therapeutic intervention in order to improve the following deficits and impairments:  Abnormal gait, Decreased balance, Decreased endurance, Decreased mobility, Difficulty walking, Cardiopulmonary status limiting activity, Decreased activity tolerance, Decreased strength  Visit Diagnosis: Muscle weakness (generalized)  Unsteadiness on feet  History of falling     Problem List Patient Active Problem List   Diagnosis Date Noted  . Syncope 01/18/2019  . Thrombocytopenia ([place]) 01/18/2019  . Microcytic anemia 01/18/2019  . Buttock pain 01/18/2019  . Diarrhea 01/18/2019  . Nausea & vomiting 01/18/2019  . Closed minimally displaced zone II fracture of sacrum ([place])   . Vertigo 12/05/2018  . Right orbit fracture ([employer]) 12/05/2018  . Cerebral hemorrhage ([place]) 11/26/2018  . Actinic keratosis 05/03/2018  . Chronic kidney disease, stage III (moderate) 03/29/2018  . Advanced directives, counseling/discussion 02/06/2014  . Routine general medical examination at a health care facility 07/27/2012  . Osteoporosis   . Hypertension   . Hyperlipidemia   . Allergic rhinitis, cause unspecified    . Hypothyroidism   . Prediabetes   . Osteoarthritis, multiple sites   . GERD (gastroesophageal reflux disease)     Sandra Montgomery PT, DPT 03/19/2019, 4:28 PM  [place] MAIN [facility] SERVICES [address] [place], [place], [address] Phone: [phone]   Fax:  [phone]  Name: Sandra Montgomery MRN: [id number] Date of Birth: Feb 03, 1941

## 2019-03-21 ENCOUNTER — Ambulatory Visit: Payer: Medicare Other | Admitting: Physical Therapy

## 2019-03-25 ENCOUNTER — Encounter: Payer: Medicare Other | Admitting: Physical Therapy

## 2019-03-26 ENCOUNTER — Ambulatory Visit: Payer: Medicare Other | Admitting: Physical Therapy

## 2019-03-26 ENCOUNTER — Other Ambulatory Visit: Payer: Self-pay

## 2019-03-26 ENCOUNTER — Ambulatory Visit: Payer: Medicare Other

## 2019-03-26 DIAGNOSIS — R2681 Unsteadiness on feet: Secondary | ICD-10-CM

## 2019-03-26 DIAGNOSIS — Z9181 History of falling: Secondary | ICD-10-CM

## 2019-03-26 DIAGNOSIS — M6281 Muscle weakness (generalized): Secondary | ICD-10-CM

## 2019-03-26 NOTE — Therapy (Signed)
[place] MAIN [facility] SERVICES [address] [place], [place], [address] Phone: [phone]   Fax:  [phone]  Physical Therapy Treatment  Patient Details  Name: Sandra Montgomery MRN: [id number] Date of Birth: May 16, 1940 Referring Provider (PT): Viviana Simpler MD (PCP)   Encounter Date: 03/26/2019  PT End of Session - 03/27/19 0843    Visit Number  4    Number of Visits  9    Date for PT Re-Evaluation  05/01/19    PT Start Time  1100    PT Stop Time  1145    PT Time Calculation (min)  45 min    Equipment Utilized During Treatment  Gait belt    Activity Tolerance  Patient tolerated treatment well    Behavior During Therapy  [facility] for tasks assessed/performed       Past Medical History:  Diagnosis Date  . Allergic rhinitis, cause unspecified   . Cancer (HCC)    shoulder carcinoma  . Diabetes mellitus without complication ([place])   . GERD (gastroesophageal reflux disease)   . History of blood transfusion   . Hyperlipidemia   . Hypertension   . Impaired fasting glucose   . Osteoarthrosis involving, or with mention of more than one site, but not specified as generalized, multiple sites   . Osteoporosis   . Pneumothorax 1965   prolonged bed rest   . Unspecified hypothyroidism     Past Surgical History:  Procedure Laterality Date  . ABDOMINAL HYSTERECTOMY  1981  . BREAST EXCISIONAL BIOPSY Left years ago   x 2. Negative.   Marland Kitchen BREAST SURGERY  1985   non cancerous mass removed  . CATARACT EXTRACTION W/PHACO Left 04/26/2017   Procedure: CATARACT EXTRACTION PHACO AND INTRAOCULAR LENS PLACEMENT ([place])  LEFT;  Surgeon: Leandrew Koyanagi, MD;  Location: [place];  Service: Ophthalmology;  Laterality: Left;  Diabetic - oral meds  . CATARACT EXTRACTION W/PHACO Right 05/24/2017   Procedure: CATARACT EXTRACTION PHACO AND INTRAOCULAR LENS PLACEMENT ([place]) RIGHT DIABETIC;  Surgeon: Leandrew Koyanagi, MD;  Location: [place];  Service: Ophthalmology;  Laterality: Right;  Diabetic - oral meds  . COLONOSCOPY    . COLONOSCOPY WITH PROPOFOL N/A 07/18/2016   Procedure: COLONOSCOPY WITH PROPOFOL;  Surgeon: Manya Silvas, MD;  Location: [facility] ENDOSCOPY;  Service: Endoscopy;  Laterality: N/A;  . ESOPHAGOGASTRODUODENOSCOPY    . TONSILLECTOMY AND ADENOIDECTOMY  1961    There were no vitals filed for this visit.  Subjective Assessment - 03/27/19 0828    Pertinent History  78 yo Female referred to outpatient PT for unsteady gait/balance. She has experienced 2 falls both as a result of syncope episode. In August 2020, patient fell forward at [employer] and suffered a fractured face, bruised ribs, etc. She was taken to hospital; Patient then discharged home. In October 2020 she was in line at the post office and then passed out. She is concerned that wearing a mask is limiting her oxygen which could be contributing to loss of balance/syncope. medical history significant of hypertension, hyperlipidemia, borderline diabetes not taking medications, GERD, hypothyroidism, shoulder carcinoma, LAD, recent ICH (11/25/2018), CKD stage III. Her husband recently passed a week ago. She is currently not using any assistive device. She denies any unsteadiness with most walking, but states that her falls have occured with syncope. She is unsure of cause of syncope. She has been evaluated by ENT and was diagnosed with a deviated septum. They said that they could do  surgery but didn't feel that it was severe enough to recommend surgery. She was assessed for vertigo which was negative. She reports feeling some episodes of room spinning when she has been to the beauty shop but states otherwise no symptoms of vertigo. She feels that her syncope could be coming from lack of oxygen with wearing a mask.    Limitations  Standing;Walking    How long can you sit comfortably?  Does have some pain with prolonged sitting from a recent fractured coccyx, but  she sits on a donut pillow with good tolerance;    How long can you stand comfortably?  NA    How long can you walk comfortably?  able to walk as long as needed, but unable to move as quickly as she used to    Diagnostic tests  MRI in October 2020 shows no acute changes, Small posterior scalp contusion.    Patient Stated Goals  Being able to walk as quickly as she used to, being able to just get up out of a chair without having to push up with arms.    Currently in Pain?  Yes    Pain Score  10-Worst pain ever    Pain Location  Knee    Pain Orientation  Left    Pain Descriptors / Indicators  Aching    Pain Type  Chronic pain        TREATMENT:     NMR: Tandem stance on firm surface:   Static x30sec x2 rep             head turns side/side x5 reps each foot in front;             Head turns up/down x5 reps each foot in front             Eyes open/closed 15 sec hold x2 reps each Patient required min VCs for balance stability, CGA for safety   Therapeutic exercise: ( hot pack on L knee for increased pain):  Seated ankle df with GTB 2x20 EA Seated marching 2x15 with 4lb ankle weight  Resisted PT hip abduction 2x15 ea side LAQ 2x15 bilaterally    Gait outside in hallway: Forward walking with head turns up/down, side/side x100 feet each Forward/backward walking x127ft ea ea Ball tosses 15ft ea    Patient response/clinical impression: The patient presented to clinic today with 10/10 knee pain, activities modified to avoid further exacerbation this session. The patient was able to perform exercises with seated rest breaks. Patient reported little to no improvement at end of session. PT encouraged patient to speak to PCP at her appointment on Monday about her knee pain to establish a plan, pt agreeable. The patient would benefit from further skilled PT intervention to continue to progress towards goals.     PT Education - 03/27/19 0831    Education Details  LE strength, balance, HEP     Person(s) Educated  Patient    Methods  Explanation;Verbal cues;Demonstration    Comprehension  Verbalized understanding       PT Short Term Goals - 03/06/19 1031      PT SHORT TERM GOAL #1   Title  Patient will be adherent to HEP at least 3x a week to improve functional strength and balance for better safety at home.    Time  4    Period  Weeks    Status  New    Target Date  04/03/19      PT  SHORT TERM GOAL #2   Title  Patient (> 96 years old) will complete five times sit to stand test in < 15 seconds indicating an increased LE strength and improved balance.    Time  4    Period  Weeks    Status  New    Target Date  04/03/19        PT Long Term Goals - 03/06/19 1032      PT LONG TERM GOAL #1   Title  Patient will increase six minute walk test distance to >1300 for progression to community ambulator and improve gait ability closer to age group norms.    Time  8    Period  Weeks    Status  New    Target Date  05/01/19      PT LONG TERM GOAL #2   Title  Patient will increase Functional Gait Assessment score to >20/30 as to reduce fall risk and improve dynamic gait safety with community ambulation.    Time  8    Period  Weeks    Status  New    Target Date  05/01/19      PT LONG TERM GOAL #3   Title  Patient will tolerate 5 seconds of single leg stance without loss of balance to improve ability to get in and out of shower safely.    Time  8    Period  Weeks    Status  New    Target Date  05/01/19      PT LONG TERM GOAL #4   Title  Patient will increase BLE gross strength to 4+/5 as to improve functional strength for independent gait, increased standing tolerance and increased ADL ability.    Time  8    Period  Weeks    Status  New    Target Date  05/01/19            Plan - 03/27/19 0834    Clinical Impression Statement  The patient presented to clinic today with 10/10 knee pain, activities modified to avoid further exacerbation this session. The patient was  able to perform exercises with seated rest breaks. Patient reported little to no improvement at end of session. PT encouraged patient to speak to PCP at her appointment on Monday about her knee pain to establish a plan, pt agreeable. The patient would benefit from further skilled PT intervention to continue to progress towards goals.    Personal Factors and Comorbidities  Age;Comorbidity 3+    Comorbidities  medical history significant of hypertension, hyperlipidemia, borderline diabetes not taking medications, GERD, hypothyroidism, shoulder carcinoma, LAD, recent ICH (11/25/2018), CKD stage III.    Examination-Activity Limitations  Caring for Others;Carry;Lift;Locomotion Level;Squat;Stairs;Stand;Transfers    Examination-Participation Restrictions  Cleaning;Community Activity;Shop;Volunteer;Yard Work    Rehab Potential  Good    PT Frequency  1x / week    PT Duration  8 weeks    PT Treatment/Interventions  Cryotherapy;Moist Heat;Gait training;Stair training;Functional mobility training;Therapeutic activities;Therapeutic exercise;Balance training;Neuromuscular re-education;Patient/family education;Energy conservation;Vestibular    PT Next Visit Plan  address HEP/balance/strengthening    PT Home Exercise Plan  will address next session;    Consulted and Agree with Plan of Care  Patient       Patient will benefit from skilled therapeutic intervention in order to improve the following deficits and impairments:  Abnormal gait, Decreased balance, Decreased endurance, Decreased mobility, Difficulty walking, Cardiopulmonary status limiting activity, Decreased activity tolerance, Decreased strength  Visit Diagnosis: Muscle weakness (generalized)  Unsteadiness on feet  History of falling     Problem List Patient Active Problem List   Diagnosis Date Noted  . Syncope 01/18/2019  . Thrombocytopenia ([place]) 01/18/2019  . Microcytic anemia 01/18/2019  . Buttock pain 01/18/2019  . Diarrhea 01/18/2019   . Nausea & vomiting 01/18/2019  . Closed minimally displaced zone II fracture of sacrum ([place])   . Vertigo 12/05/2018  . Right orbit fracture ([place]) 12/05/2018  . Cerebral hemorrhage ([place]) 11/26/2018  . Actinic keratosis 05/03/2018  . Chronic kidney disease, stage III (moderate) 03/29/2018  . Advanced directives, counseling/discussion 02/06/2014  . Routine general medical examination at a health care facility 07/27/2012  . Osteoporosis   . Hypertension   . Hyperlipidemia   . Allergic rhinitis, cause unspecified   . Hypothyroidism   . Prediabetes   . Osteoarthritis, multiple sites   . GERD (gastroesophageal reflux disease)     Lieutenant Diego PT, DPT 8:45 AM,03/27/19   [place] MAIN [facility] SERVICES [address] [place], [place], [address] Phone: [phone]   Fax:  [phone]  Name: Sandra Montgomery MRN: [id number] Date of Birth: April 16, 1940

## 2019-04-01 ENCOUNTER — Other Ambulatory Visit: Payer: Self-pay

## 2019-04-01 ENCOUNTER — Ambulatory Visit (INDEPENDENT_AMBULATORY_CARE_PROVIDER_SITE_OTHER): Payer: Medicare Other | Admitting: Internal Medicine

## 2019-04-01 ENCOUNTER — Encounter: Payer: Self-pay | Admitting: Internal Medicine

## 2019-04-01 VITALS — BP 120/70 | HR 72 | Temp 97.6°F | Ht 64.25 in | Wt 157.0 lb

## 2019-04-01 DIAGNOSIS — N1831 Chronic kidney disease, stage 3a: Secondary | ICD-10-CM | POA: Diagnosis not present

## 2019-04-01 DIAGNOSIS — Z7189 Other specified counseling: Secondary | ICD-10-CM | POA: Diagnosis not present

## 2019-04-01 DIAGNOSIS — Z0000 Encounter for general adult medical examination without abnormal findings: Secondary | ICD-10-CM | POA: Diagnosis not present

## 2019-04-01 DIAGNOSIS — I10 Essential (primary) hypertension: Secondary | ICD-10-CM

## 2019-04-01 DIAGNOSIS — K219 Gastro-esophageal reflux disease without esophagitis: Secondary | ICD-10-CM

## 2019-04-01 DIAGNOSIS — E039 Hypothyroidism, unspecified: Secondary | ICD-10-CM | POA: Diagnosis not present

## 2019-04-01 DIAGNOSIS — R7303 Prediabetes: Secondary | ICD-10-CM | POA: Diagnosis not present

## 2019-04-01 NOTE — Assessment & Plan Note (Signed)
Controlled with PPI

## 2019-04-01 NOTE — Assessment & Plan Note (Signed)
Hopefully better with 25# weight loss

## 2019-04-01 NOTE — Patient Instructions (Signed)
Please set up your screening mammogram. 

## 2019-04-01 NOTE — Assessment & Plan Note (Signed)
BP Readings from Last 3 Encounters:  04/01/19 120/70  01/25/19 130/84  01/22/19 (!) 172/79   Good control Don't think this was related to the syncope

## 2019-04-01 NOTE — Assessment & Plan Note (Signed)
Will recheck labs 

## 2019-04-01 NOTE — Assessment & Plan Note (Signed)
See social history 
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
## 2019-04-01 NOTE — Assessment & Plan Note (Signed)
I have personally reviewed the Medicare Annual Wellness questionnaire and have noted 1. The patient's medical and social history 2. Their use of alcohol, tobacco or illicit drugs 3. Their current medications and supplements 4. The patient's functional ability including ADL's, fall risks, home safety risks and hearing or visual             impairment. 5. Diet and physical activities 6. Evidence for depression or mood disorders  The patients weight, height, BMI and visual acuity have been recorded in the chart I have made referrals, counseling and provided education to the patient based review of the above and I have provided the pt with a written personalized care plan for preventive services.  I have provided you with a copy of your personalized plan for preventive services. Please take the time to review along with your updated medication list.  Recommended 1 last mammogram Done with colons--benign in 2018 Yearly flu vaccine Will consider shingrix at pharmacy Discussed exercise

## 2019-04-01 NOTE — Assessment & Plan Note (Signed)
Seems euthyroid still without meds

## 2019-04-01 NOTE — Progress Notes (Signed)
Hearing Screening   125Hz  250Hz  500Hz  1000Hz  2000Hz  3000Hz  4000Hz  6000Hz  8000Hz   Right ear:   25 40 20  40    Left ear:   20 25 20  25     Vision Screening Comments: February 2020

## 2019-04-02 ENCOUNTER — Telehealth: Payer: Self-pay | Admitting: Internal Medicine

## 2019-04-02 ENCOUNTER — Telehealth: Payer: Self-pay | Admitting: Gastroenterology

## 2019-04-02 ENCOUNTER — Ambulatory Visit: Payer: Medicare Other | Admitting: Physical Therapy

## 2019-04-02 ENCOUNTER — Telehealth: Payer: Self-pay | Admitting: Radiology

## 2019-04-02 ENCOUNTER — Encounter: Payer: Medicare Other | Admitting: Physical Therapy

## 2019-04-02 DIAGNOSIS — D649 Anemia, unspecified: Secondary | ICD-10-CM

## 2019-04-02 LAB — CBC
HCT: 22.8 % — CL (ref 36.0–46.0)
Hemoglobin: 6.9 g/dL — CL (ref 12.0–15.0)
MCHC: 30.2 g/dL (ref 30.0–36.0)
MCV: 68.9 fl — ABNORMAL LOW (ref 78.0–100.0)
Platelets: 283 10*3/uL (ref 150.0–400.0)
RBC: 3.31 Mil/uL — ABNORMAL LOW (ref 3.87–5.11)
RDW: 17.9 % — ABNORMAL HIGH (ref 11.5–15.5)
WBC: 9.3 10*3/uL (ref 4.0–10.5)

## 2019-04-02 LAB — RENAL FUNCTION PANEL
Albumin: 4.3 g/dL (ref 3.5–5.2)
BUN: 21 mg/dL (ref 6–23)
CO2: 27 mEq/L (ref 19–32)
Calcium: 9.4 mg/dL (ref 8.4–10.5)
Chloride: 104 mEq/L (ref 96–112)
Creatinine, Ser: 1.03 mg/dL (ref 0.40–1.20)
GFR: 51.96 mL/min — ABNORMAL LOW (ref >60.00)
Glucose, Bld: 89 mg/dL (ref 70–99)
Phosphorus: 3.2 mg/dL (ref 2.3–4.6)
Potassium: 4.4 mEq/L (ref 3.5–5.1)
Sodium: 138 mEq/L (ref 135–145)

## 2019-04-02 LAB — HEPATIC FUNCTION PANEL
ALT: 11 U/L (ref 0–35)
AST: 15 U/L (ref 0–37)
Albumin: 4.3 g/dL (ref 3.5–5.2)
Alkaline Phosphatase: 71 U/L (ref 39–117)
Bilirubin, Direct: 0.1 mg/dL (ref 0.0–0.3)
Total Bilirubin: 0.3 mg/dL (ref 0.2–1.2)
Total Protein: 6.6 g/dL (ref 6.0–8.3)

## 2019-04-02 LAB — HEMOGLOBIN A1C: Hgb A1c MFr Bld: 6.2 % (ref 4.6–6.5)

## 2019-04-02 LAB — TSH: TSH: 4.2 u[IU]/mL (ref 0.35–4.50)

## 2019-04-02 LAB — T4, FREE: Free T4: 0.82 ng/dL (ref 0.60–1.60)

## 2019-04-02 NOTE — Telephone Encounter (Signed)
Tried calling. Still no answer.

## 2019-04-02 NOTE — Telephone Encounter (Signed)
I tried to call pt but her cell VM is not set up.

## 2019-04-02 NOTE — Telephone Encounter (Signed)
[facility] called a critical HGB - 6.9, HCT - 22.8. Results given to Dr Silvio Pate

## 2019-04-02 NOTE — Telephone Encounter (Signed)
Spoke to pt. She said she does feel tired. I advise her that if anything changes before tomorrow, to go to the ER. Otherwise, plan on going tomorrow and pack a bag just in case they keep her.

## 2019-04-02 NOTE — Telephone Encounter (Signed)
Received a call from [employer] from [facility] GI Dept. After reviewing labs that we faxed over for a Urgent appointment Sharyn Lull and Dr [name] recommended that we send the patient to the [facility] ED. She will likely need a blood transfusion before they will be able to do a procedure on her to see where she is bleeding and it is not possible to get that done in a timely manner. Dr [name] and [employer] are on call tomorrow 04/03/19 and will be aware that she may go to the ED where they can take care of her. They feek this is the most direct way of taking care of her right now. She probably has a small bleeding ulcer and after taking the Aleve it made things worse. If patient is feeling ok have her go to the ED on Wednesday, 04/03/19.

## 2019-04-02 NOTE — Telephone Encounter (Deleted)
Received a call from [employer] from [facility] GI Dept. After reviewing labs that we faxed over for a Urgent appointment Sharyn Lull and Dr [name] recommended that we send the patient to the [facility] ED. She will likely need a blood transfusion before they will be able to do a procedure on her to see where she is bleeding and it is not possible to get that done in a timely manner. Dr [name] and [employer] are on call tomorrow 04/03/19 and will be aware that she may go to the ED where they can take care of her. They feek this is the most direct way of taking care of her right now. She probably has a small bleeding ulcer and after taking the Aleve it made things worse. If patient is feeling ok have her go to the ED on Wednesday, 04/03/19.

## 2019-04-02 NOTE — Telephone Encounter (Signed)
This is down from 8.2 in October while in the hospital Will set up urgent GI evaluation Make sure she stops naproxen (and asa, ibuprofen, etc)

## 2019-04-02 NOTE — Telephone Encounter (Signed)
I spoke with Sandra Montgomery and the pt is a current patient of [facility].  She last saw Dr Vira Agar for colonoscopy in 2018.  I advised that she should call that office for continuity of care and urgency of the request for appt.  Sandra Montgomery states that Dr Vira Agar retired but she will speak with the pt about seeing the MD that is currently at that practice.  I did tell her that I could send the records to the doc of the day to review and see if the pt would be accepted to our practice and she states that it may be quicker to get the pt in at the current practice she is a pt of.  She will call back if needed.

## 2019-04-02 NOTE — Telephone Encounter (Signed)
Spoke to pt. She will stop the Aleve. Will wait for the call for the referral.

## 2019-04-03 ENCOUNTER — Emergency Department
Admission: EM | Admit: 2019-04-03 | Discharge: 2019-04-03 | Disposition: A | Discharge status: Home / Self Care | Payer: Medicare Other | Attending: Emergency Medicine | Admitting: Emergency Medicine

## 2019-04-03 ENCOUNTER — Other Ambulatory Visit: Payer: Self-pay

## 2019-04-03 DIAGNOSIS — E039 Hypothyroidism, unspecified: Secondary | ICD-10-CM | POA: Diagnosis not present

## 2019-04-03 DIAGNOSIS — E1122 Type 2 diabetes mellitus with diabetic chronic kidney disease: Secondary | ICD-10-CM | POA: Insufficient documentation

## 2019-04-03 DIAGNOSIS — D509 Iron deficiency anemia, unspecified: Secondary | ICD-10-CM

## 2019-04-03 DIAGNOSIS — N183 Chronic kidney disease, stage 3 unspecified: Secondary | ICD-10-CM | POA: Diagnosis not present

## 2019-04-03 DIAGNOSIS — I129 Hypertensive chronic kidney disease with stage 1 through stage 4 chronic kidney disease, or unspecified chronic kidney disease: Secondary | ICD-10-CM | POA: Insufficient documentation

## 2019-04-03 DIAGNOSIS — D649 Anemia, unspecified: Secondary | ICD-10-CM

## 2019-04-03 DIAGNOSIS — R7989 Other specified abnormal findings of blood chemistry: Secondary | ICD-10-CM | POA: Diagnosis present

## 2019-04-03 LAB — COMPREHENSIVE METABOLIC PANEL
ALT: 15 U/L (ref 0–44)
AST: 20 U/L (ref 15–41)
Albumin: 4.1 g/dL (ref 3.5–5.0)
Alkaline Phosphatase: 68 U/L (ref 38–126)
Anion gap: 11 (ref 5–15)
BUN: 26 mg/dL — ABNORMAL HIGH (ref 8–23)
CO2: 24 mmol/L (ref 22–32)
Calcium: 9.4 mg/dL (ref 8.9–10.3)
Chloride: 106 mmol/L (ref 98–111)
Creatinine, Ser: 1.06 mg/dL — ABNORMAL HIGH (ref 0.44–1.00)
GFR calc Af Amer: 59 mL/min — ABNORMAL LOW (ref >60)
GFR calc non Af Amer: 51 mL/min — ABNORMAL LOW (ref >60)
Glucose, Bld: 146 mg/dL — ABNORMAL HIGH (ref 70–99)
Potassium: 4.1 mmol/L (ref 3.5–5.1)
Sodium: 141 mmol/L (ref 135–145)
Total Bilirubin: 0.9 mg/dL (ref 0.3–1.2)
Total Protein: 7.4 g/dL (ref 6.5–8.1)

## 2019-04-03 LAB — IRON AND TIBC
Iron: 20 ug/dL — ABNORMAL LOW (ref 28–170)
Saturation Ratios: 4 % — ABNORMAL LOW (ref 10.4–31.8)
TIBC: 484 ug/dL — ABNORMAL HIGH (ref 250–450)
UIBC: 464 ug/dL

## 2019-04-03 LAB — CBC
HCT: 23.5 % — ABNORMAL LOW (ref 36.0–46.0)
Hemoglobin: 6.7 g/dL — ABNORMAL LOW (ref 12.0–15.0)
MCH: 20.4 pg — ABNORMAL LOW (ref 26.0–34.0)
MCHC: 28.5 g/dL — ABNORMAL LOW (ref 30.0–36.0)
MCV: 71.6 fL — ABNORMAL LOW (ref 80.0–100.0)
Platelets: 294 10*3/uL (ref 150–400)
RBC: 3.28 MIL/uL — ABNORMAL LOW (ref 3.87–5.11)
RDW: 17.7 % — ABNORMAL HIGH (ref 11.5–15.5)
WBC: 7.6 10*3/uL (ref 4.0–10.5)
nRBC: 0 % (ref 0.0–0.2)

## 2019-04-03 LAB — PREPARE RBC (CROSSMATCH)

## 2019-04-03 LAB — FERRITIN: Ferritin: 3 ng/mL — ABNORMAL LOW (ref 11–307)

## 2019-04-03 LAB — ABO/RH: ABO/RH(D): A POS

## 2019-04-03 MED ORDER — RABIES VACCINE, PCEC IM SUSR: 1.0000 mL | Freq: Once | INTRAMUSCULAR | Status: DC

## 2019-04-03 MED ORDER — IRON 325 (65 FE) MG PO TABS: 1.0000 | ORAL_TABLET | Freq: Every day | ORAL | 0 refills | Status: DC

## 2019-04-03 MED ORDER — DOCUSATE SODIUM 100 MG PO CAPS: 200.0000 mg | ORAL_CAPSULE | Freq: Two times a day (BID) | ORAL | 0 refills | Status: DC

## 2019-04-03 MED ORDER — SODIUM CHLORIDE 0.9 % IV SOLN: 10.0000 mL/h | Freq: Once | INTRAVENOUS | Status: DC

## 2019-04-03 NOTE — ED Notes (Signed)
Pt updated on wait by this RN. Verbalized understanding.

## 2019-04-03 NOTE — ED Notes (Signed)
EDP made aware per bloodbank, pt only eligible for 1 unit of blood.

## 2019-04-03 NOTE — ED Notes (Signed)
ED Provider at bedside. 

## 2019-04-03 NOTE — ED Notes (Signed)
Blood infusion started. Pt A&Ox4. Pt in NAD. VSS. Pt denies any needs at this time. Will continue to round.

## 2019-04-03 NOTE — ED Notes (Signed)
Pt given 2 blankets by this RN. Pt resting in comfortably in bed. Explained delay to patient at this time. Pt states understanding.

## 2019-04-03 NOTE — ED Provider Notes (Addendum)
[facility] Emergency Department Provider Note  ____________________________________________  Time seen: Approximately 2:38 PM  I have reviewed the triage vital signs and the nursing notes.   HISTORY  Chief Complaint Abnormal Lab    HPI Sandra Montgomery is a 78 y.o. female with a history of GERD diabetes hypertension who is sent to the ED out of concerns for symptomatic anemia and bleeding stomach ulcer.  Primary care noted her hemoglobin level decreased to 6.8 compared to 8.2 previously.  They had plan for her to follow-up with GI outpatient but were concerned that with downtrending hemoglobin she may need to be transfused first or seen more urgently.  Patient does report that sometimes she feels fatigued whenever she walks a long distance, but does not get lightheaded or pass out with standing.  She does change position more slowly to allow herself time to equilibrate.  Denies chest pain or shortness of breath.   Symptoms are gradual in onset, intermittent.  She reports that her stools remain brown, no black or runny stool.  Does not take blood thinners.  She has been taking twice daily naproxen over a long period of time but recently stopped at the direction of her doctor.     Past Medical History:  Diagnosis Date  . Allergic rhinitis, cause unspecified   . Cancer (HCC)    shoulder carcinoma  . Diabetes mellitus without complication ([place])   . GERD (gastroesophageal reflux disease)   . History of blood transfusion   . Hyperlipidemia   . Hypertension   . Impaired fasting glucose   . Osteoarthrosis involving, or with mention of more than one site, but not specified as generalized, multiple sites   . Osteoporosis   . Pneumothorax 1965   prolonged bed rest   . Unspecified hypothyroidism      Patient Active Problem List   Diagnosis Date Noted  . Syncope 01/18/2019  . Thrombocytopenia ([place]) 01/18/2019  . Microcytic anemia 01/18/2019  . Vertigo  12/05/2018  . Actinic keratosis 05/03/2018  . Chronic kidney disease, stage III (moderate) 03/29/2018  . Advanced directives, counseling/discussion 02/06/2014  . Routine general medical examination at a health care facility 07/27/2012  . Osteoporosis   . Hypertension   . Hyperlipidemia   . Allergic rhinitis, cause unspecified   . Hypothyroidism   . Prediabetes   . Osteoarthritis, multiple sites   . GERD (gastroesophageal reflux disease)      Past Surgical History:  Procedure Laterality Date  . ABDOMINAL HYSTERECTOMY  1981  . BREAST EXCISIONAL BIOPSY Left years ago   x 2. Negative.   Marland Kitchen BREAST SURGERY  1985   non cancerous mass removed  . CATARACT EXTRACTION W/PHACO Left 04/26/2017   Procedure: CATARACT EXTRACTION PHACO AND INTRAOCULAR LENS PLACEMENT ([place])  LEFT;  Surgeon: Leandrew Koyanagi, MD;  Location: [place];  Service: Ophthalmology;  Laterality: Left;  Diabetic - oral meds  . CATARACT EXTRACTION W/PHACO Right 05/24/2017   Procedure: CATARACT EXTRACTION PHACO AND INTRAOCULAR LENS PLACEMENT ([place]) RIGHT DIABETIC;  Surgeon: Leandrew Koyanagi, MD;  Location: [place];  Service: Ophthalmology;  Laterality: Right;  Diabetic - oral meds  . COLONOSCOPY    . COLONOSCOPY WITH PROPOFOL N/A 07/18/2016   Procedure: COLONOSCOPY WITH PROPOFOL;  Surgeon: Manya Silvas, MD;  Location: [facility] ENDOSCOPY;  Service: Endoscopy;  Laterality: N/A;  . ESOPHAGOGASTRODUODENOSCOPY    . TONSILLECTOMY AND ADENOIDECTOMY  1961     Prior to Admission medications   Medication Sig Start Date  End Date Taking? Authorizing Provider  atorvastatin (LIPITOR) 80 MG tablet 1 by mouth daily Patient taking differently: Take 80 mg by mouth at bedtime.  03/29/18   Venia Carbon, MD  Cholecalciferol (VITAMIN D3 PO) Take 1 capsule by mouth every morning. 1000 IU    [provider]  docusate sodium (COLACE) 100 MG capsule Take 2 capsules (200 mg total) by mouth 2 (two) times daily.  04/03/19   Carrie Mew, MD  Ferrous Sulfate (IRON) 325 (65 Fe) MG TABS Take 1 tablet (325 mg total) by mouth daily. 04/03/19 06/02/19  Carrie Mew, MD  LORazepam (ATIVAN) 0.5 MG tablet Take 0.5-1 tablets (0.25-0.5 mg total) by mouth 3 (three) times daily as needed for anxiety. 02/20/19   Venia Carbon, MD  naproxen sodium (ALEVE) 220 MG tablet Take 220-440 mg by mouth See admin instructions. Take two tablets (440 mg) by mouth every morning, may also take one or two tablets (220-440 mg) at night as needed for pain/headache    [provider]  olmesartan (BENICAR) 40 MG tablet Take 1 tablet (40 mg total) by mouth daily. 05/09/18   Venia Carbon, MD  omeprazole (PRILOSEC) 40 MG capsule Take 1 capsule (40 mg total) by mouth daily. 03/29/18   Venia Carbon, MD  Probiotic Product (PROBIOTIC DAILY PO) Take 1 capsule by mouth daily.    [provider]  zinc gluconate 50 MG tablet Take 50 mg by mouth daily.    [provider]     Allergies Short ragweed pollen ext and Tape   Family History  Problem Relation Age of Onset  . Alzheimer's disease Mother   . Hyperlipidemia Father   . Heart disease Father     Social History Social History   Tobacco Use  . Smoking status: Never Smoker  . Smokeless tobacco: Never Used  Substance Use Topics  . Alcohol use: No  . Drug use: No    Review of Systems  Constitutional:   No fever or chills.  ENT:   No sore throat. No rhinorrhea. Cardiovascular:   No chest pain or syncope. Respiratory:   No dyspnea or cough. Gastrointestinal:   Negative for abdominal pain, vomiting and diarrhea.  Musculoskeletal:   Negative for focal pain or swelling All other systems reviewed and are negative except as documented above in ROS and HPI.  ____________________________________________   PHYSICAL EXAM:  VITAL SIGNS: ED Triage Vitals  Enc Vitals Group     BP 04/03/19 0815 (!) 159/94     Pulse Rate 04/03/19 0815 84      Resp 04/03/19 0815 16     Temp 04/03/19 1407 97.9 F (36.6 C)     Temp Source 04/03/19 0815 Oral     SpO2 04/03/19 0815 100 %     Weight 04/03/19 0816 157 lb (71.2 kg)     Height 04/03/19 0816 5\' 4"  (1.626 m)     Head Circumference --      Peak Flow --      Pain Score 04/03/19 0815 0     Pain Loc --      Pain Edu? --      Excl. in [place]? --     Vital signs reviewed, nursing assessments reviewed.   Constitutional:   Alert and oriented. Non-toxic appearance. Eyes:   Conjunctivae are mildly pale. EOMI. PERRL. ENT      Head:   Normocephalic and atraumatic.      Nose:   Wearing a mask.  Mouth/Throat:   Wearing a mask.      Neck:   No meningismus. Full ROM. Hematological/Lymphatic/Immunilogical:   No cervical lymphadenopathy. Cardiovascular:   RRR. Symmetric bilateral radial and DP pulses.  No murmurs. Cap refill less than 2 seconds. Respiratory:   Normal respiratory effort without tachypnea/retractions. Breath sounds are clear and equal bilaterally. No wheezes/rales/rhonchi. Gastrointestinal:   Soft and nontender. Non distended. There is no CVA tenderness.  No rebound, rigidity, or guarding.  Rectal exam shows brown stool, Hemoccult negative Musculoskeletal:   Normal range of motion in all extremities. No joint effusions.  No lower extremity tenderness.  No edema. Neurologic:   Normal speech and language.  Motor grossly intact. No acute focal neurologic deficits are appreciated.  Skin:    Skin is warm, dry and intact. No rash noted.  No petechiae, purpura, or bullae.  ____________________________________________    LABS (pertinent positives/negatives) (all labs ordered are listed, but only abnormal results are displayed) Labs Reviewed  COMPREHENSIVE METABOLIC PANEL - Abnormal; Notable for the following components:      Result Value   Glucose, Bld 146 (*)    BUN 26 (*)    Creatinine, Ser 1.06 (*)    GFR calc non Af Amer 51 (*)    GFR calc Af Amer 59 (*)    All other  components within normal limits  CBC - Abnormal; Notable for the following components:   RBC 3.28 (*)    Hemoglobin 6.7 (*)    HCT 23.5 (*)    MCV 71.6 (*)    MCH 20.4 (*)    MCHC 28.5 (*)    RDW 17.7 (*)    All other components within normal limits  IRON AND TIBC - Abnormal; Notable for the following components:   Iron 20 (*)    TIBC 484 (*)    Saturation Ratios 4 (*)    All other components within normal limits  FERRITIN - Abnormal; Notable for the following components:   Ferritin 3 (*)    All other components within normal limits  POC OCCULT BLOOD, ED  TYPE AND SCREEN  PREPARE RBC (CROSSMATCH)  ABO/RH   ____________________________________________   EKG    ____________________________________________    RADIOLOGY  No results found.  ____________________________________________   PROCEDURES .Critical Care Performed by: Carrie Mew, MD Authorized by: Carrie Mew, MD   Critical care provider statement:    Critical care time (minutes):  35   Critical care time was exclusive of:  Separately billable procedures and treating other patients   Critical care was necessary to treat or prevent imminent or life-threatening deterioration of the following conditions:  Circulatory failure   Critical care was time spent personally by me on the following activities:  Development of treatment plan with patient or surrogate, discussions with consultants, evaluation of patient's response to treatment, examination of patient, obtaining history from patient or surrogate, ordering and performing treatments and interventions, ordering and review of laboratory studies, ordering and review of radiographic studies, pulse oximetry, re-evaluation of patient's condition and review of old charts    ____________________________________________    CLINICAL IMPRESSION / [place] / ED COURSE  Medications ordered in the ED: Medications  0.9 %  sodium chloride  infusion (0 mL/hr Intravenous Hold 04/03/19 1412)    Pertinent labs & imaging results that were available during my care of the patient were reviewed by me and considered in my medical decision making (see chart for details).  Sandra Montgomery was evaluated  in Emergency Department on 04/03/2019 for the symptoms described in the history of present illness. She was evaluated in the context of the global COVID-19 pandemic, which necessitated consideration that the patient might be at risk for infection with the SARS-CoV-2 virus that causes COVID-19. Institutional protocols and algorithms that pertain to the evaluation of patients at risk for COVID-19 are in a state of rapid change based on information released by regulatory bodies including the CDC and federal and state organizations. These policies and algorithms were followed during the patient's care in the ED.   Patient presents with hemoglobin level of 6.7.  This is downtrending from an initial level of 8.94 months ago.  This appears to be a slow, continuous decline.  Exam today reveals no evidence of acute blood loss.  Vital signs are normal, no severe symptoms.  We will check an iron panel, plan to transfuse.  Given that she is hemodynamically stable and without acute blood loss in the hospital is currently overwhelmed due to the Covid pandemic, will plan for outpatient follow-up after transfusion.  Attempted to reach her outpatient gastroenterologist without success.  Clinical Course as of Apr 02 1441  Wed Apr 03, 2019  1436 Iron panel consistent with severe iron deficiency which may be causing her anemia which is very gradual in onset.  Vital signs are unremarkable, she stable for discharge home after transfusion, would start an iron supplement and continue following up with primary care.   [PS]    Clinical Course User Index [PS] Carrie Mew, MD     ____________________________________________   FINAL CLINICAL IMPRESSION(S) / ED  DIAGNOSES    Final diagnoses:  Symptomatic anemia  Iron deficiency anemia, unspecified iron deficiency anemia type     ED Discharge Orders         Ordered    Ferrous Sulfate (IRON) 325 (65 Fe) MG TABS  Daily     04/03/19 1438    docusate sodium (COLACE) 100 MG capsule  2 times daily     04/03/19 1438          Portions of this note were generated with dragon dictation software. Dictation errors may occur despite best attempts at proofreading.   Carrie Mew, MD 04/03/19 1442    Carrie Mew, MD 04/03/19 [phone]

## 2019-04-03 NOTE — ED Triage Notes (Signed)
Pt sent for low Hbg 6.9, hx of blood transfusion. States she went for her wellness visit 2 days ago.

## 2019-04-03 NOTE — ED Notes (Signed)
Pt assisted to restroom.  

## 2019-04-03 NOTE — Discharge Instructions (Signed)
Your blood test today reveal very low iron levels, and iron deficiency may be the cause of your anemia.  Start taking a daily iron supplement and follow-up with your doctor in a week for reassessment.  Return to the emergency room if you develop fever, bowel movements that are bloody or black and oily, or vomiting blood.

## 2019-04-04 LAB — BPAM RBC
Blood Product Expiration Date: 202101092359
ISSUE DATE / TIME: 202012301404
Unit Type and Rh: 600

## 2019-04-04 LAB — TYPE AND SCREEN
ABO/RH(D): A POS
Antibody Screen: NEGATIVE
Unit division: 0

## 2019-04-08 DIAGNOSIS — M6530 Trigger finger, unspecified finger: Secondary | ICD-10-CM | POA: Insufficient documentation

## 2019-04-08 DIAGNOSIS — M6208 Separation of muscle (nontraumatic), other site: Secondary | ICD-10-CM | POA: Insufficient documentation

## 2019-04-08 DIAGNOSIS — E669 Obesity, unspecified: Secondary | ICD-10-CM | POA: Insufficient documentation

## 2019-04-08 DIAGNOSIS — M539 Dorsopathy, unspecified: Secondary | ICD-10-CM | POA: Insufficient documentation

## 2019-04-09 ENCOUNTER — Telehealth: Payer: Self-pay | Admitting: Cardiology

## 2019-04-09 ENCOUNTER — Other Ambulatory Visit: Payer: Self-pay

## 2019-04-09 ENCOUNTER — Ambulatory Visit: Payer: Medicare Other | Attending: Internal Medicine

## 2019-04-09 VITALS — BP 155/75 | HR 68

## 2019-04-09 DIAGNOSIS — R2681 Unsteadiness on feet: Secondary | ICD-10-CM

## 2019-04-09 DIAGNOSIS — Z9181 History of falling: Secondary | ICD-10-CM

## 2019-04-09 DIAGNOSIS — M6281 Muscle weakness (generalized): Secondary | ICD-10-CM

## 2019-04-09 NOTE — Telephone Encounter (Signed)
   Primary Cardiologist: Kate Sable, MD  Chart reviewed as part of pre-operative protocol coverage. Given past medical history and time since last visit, based on ACC/AHA guidelines, Sandra Montgomery would be at acceptable risk for the planned procedure without further cardiovascular testing. Okay to hold ASA day of procedure.   I will route this recommendation to the requesting party via Epic fax function and remove from pre-op pool.  Please call with questions.  Phill Myron. [place] Pugh, ANP, AACC  04/09/2019, 2:46 PM

## 2019-04-09 NOTE — Telephone Encounter (Signed)
   [facility] Pre-operative Risk Assessment    Request for surgical clearance:  1. What type of surgery is being performed? Colonoscopy and Upper Endoscopy  What type of clearance is required (medical clearance vs. Pharmacy clearance to hold med vs. Both)? both 2. Are there any medications that need to be held prior to surgery and how long? Aspirin 81 mg  3. Practice name and name of physician performing surgery? [facility], [place]  4. What is your office phone number [phone]   7.   What is your office fax number [phone]  8.   Anesthesia type (None, local, MAC, general) ? Not listed   Sandra Montgomery 04/09/2019, 2:34 PM  _________________________________________________________________   (provider comments below)

## 2019-04-09 NOTE — Therapy (Signed)
[place] MAIN [facility] SERVICES [address] [place], [place], [address] Phone: [phone]   Fax:  [phone]  Physical Therapy Treatment  Patient Details  Name: Sandra Montgomery MRN: [id number] Date of Birth: 05-30-1940 Referring Provider (PT): Viviana Simpler MD (PCP)   Encounter Date: 04/09/2019  PT End of Session - 04/09/19 1317    Visit Number  5    Number of Visits  9    Date for PT Re-Evaluation  05/01/19    Authorization Time Period  03/05/19-05/01/19    PT Start Time  1302    PT Stop Time  1342    PT Time Calculation (min)  40 min    Activity Tolerance  Patient tolerated treatment well;No increased pain;Patient limited by pain    Behavior During Therapy  [facility] for tasks assessed/performed       Past Medical History:  Diagnosis Date  . Allergic rhinitis, cause unspecified   . Cancer (HCC)    shoulder carcinoma  . Diabetes mellitus without complication ([place])   . GERD (gastroesophageal reflux disease)   . History of blood transfusion   . Hyperlipidemia   . Hypertension   . Impaired fasting glucose   . Osteoarthrosis involving, or with mention of more than one site, but not specified as generalized, multiple sites   . Osteoporosis   . Pneumothorax 1965   prolonged bed rest   . Unspecified hypothyroidism     Past Surgical History:  Procedure Laterality Date  . ABDOMINAL HYSTERECTOMY  1981  . BREAST EXCISIONAL BIOPSY Left years ago   x 2. Negative.   Marland Kitchen BREAST SURGERY  1985   non cancerous mass removed  . CATARACT EXTRACTION W/PHACO Left 04/26/2017   Procedure: CATARACT EXTRACTION PHACO AND INTRAOCULAR LENS PLACEMENT ([place])  LEFT;  Surgeon: Leandrew Koyanagi, MD;  Location: [place];  Service: Ophthalmology;  Laterality: Left;  Diabetic - oral meds  . CATARACT EXTRACTION W/PHACO Right 05/24/2017   Procedure: CATARACT EXTRACTION PHACO AND INTRAOCULAR LENS PLACEMENT ([place]) RIGHT DIABETIC;  Surgeon: Leandrew Koyanagi, MD;  Location: [place];  Service: Ophthalmology;  Laterality: Right;  Diabetic - oral meds  . COLONOSCOPY    . COLONOSCOPY WITH PROPOFOL N/A 07/18/2016   Procedure: COLONOSCOPY WITH PROPOFOL;  Surgeon: Manya Silvas, MD;  Location: [facility] ENDOSCOPY;  Service: Endoscopy;  Laterality: N/A;  . ESOPHAGOGASTRODUODENOSCOPY    . TONSILLECTOMY AND ADENOIDECTOMY  1961    Vitals:   04/09/19 1307  BP: (!) 155/75  Pulse: 68    Subjective Assessment - 04/09/19 1307    Subjective  Pt reports an eventful last 7 days. Was sent to ED for ABLA, Hb: 6.9, was given 1 unit in ED. Pt reports minimal sequela but does endorse some SOB which is not terribly different from base baesline.    Pertinent History  79 yo Female referred to outpatient PT for unsteady gait/balance. She has experienced 2 falls both as a result of syncope episode. In August 2020, patient fell forward at [employer] and suffered a fractured face, bruised ribs, etc. She was taken to hospital; Patient then discharged home. In October 2020 she was in line at the post office and then passed out. She is concerned that wearing a mask is limiting her oxygen which could be contributing to loss of balance/syncope. medical history significant of hypertension, hyperlipidemia, borderline diabetes not taking medications, GERD, hypothyroidism, shoulder carcinoma, LAD, recent ICH (11/25/2018), CKD stage III. Her husband recently passed a  week ago. She is currently not using any assistive device. She denies any unsteadiness with most walking, but states that her falls have occured with syncope. She is unsure of cause of syncope. She has been evaluated by ENT and was diagnosed with a deviated septum. They said that they could do surgery but didn't feel that it was severe enough to recommend surgery. She was assessed for vertigo which was negative. She reports feeling some episodes of room spinning when she has been to the beauty shop but states  otherwise no symptoms of vertigo. She feels that her syncope could be coming from lack of oxygen with wearing a mask.    Currently in Pain?  Yes    Pain Score  7     Pain Location  --   bilat knees      INTERVENTION THIS DATE:  -BP assessment, 150s SBP, then 160s SBP for 2nd assessment -seated LAQ 1x20 bilat, gravity resisted  -standing marching c 4lb AW 2x10 bilat  -tandem stance on 2x4 beam 3x30sec bilat, intermittent  -side stepping in // bars, short and choppy, 4lb AW bilat 2x40ft  -seated ankle DF 2x15 bilat c 4lb AW  -Airex foam AMB: 15 vertical headturns, 20 horizontal head turns, 15 chest press c 5lb weight -airex eyes closed 2x30sec     PT Short Term Goals - 03/06/19 1031      PT SHORT TERM GOAL #1   Title  Patient will be adherent to HEP at least 3x a week to improve functional strength and balance for better safety at home.    Time  4    Period  Weeks    Status  New    Target Date  04/03/19      PT SHORT TERM GOAL #2   Title  Patient (> 58 years old) will complete five times sit to stand test in < 15 seconds indicating an increased LE strength and improved balance.    Time  4    Period  Weeks    Status  New    Target Date  04/03/19        PT Long Term Goals - 03/06/19 1032      PT LONG TERM GOAL #1   Title  Patient will increase six minute walk test distance to >1300 for progression to community ambulator and improve gait ability closer to age group norms.    Time  8    Period  Weeks    Status  New    Target Date  05/01/19      PT LONG TERM GOAL #2   Title  Patient will increase Functional Gait Assessment score to >20/30 as to reduce fall risk and improve dynamic gait safety with community ambulation.    Time  8    Period  Weeks    Status  New    Target Date  05/01/19      PT LONG TERM GOAL #3   Title  Patient will tolerate 5 seconds of single leg stance without loss of balance to improve ability to get in and out of shower safely.    Time  8     Period  Weeks    Status  New    Target Date  05/01/19      PT LONG TERM GOAL #4   Title  Patient will increase BLE gross strength to 4+/5 as to improve functional strength for independent gait, increased standing tolerance and increased ADL ability.  Time  8    Period  Weeks    Status  New    Target Date  05/01/19            Plan - 04/09/19 1319    Clinical Impression Statement In general, patient demonstrating good tolerance to therapy session this date, reasonable accommodations are alllowed in-session to allow adequate rest between activities as needed as patient is now off NSAIDs and bilat knee pain is exacerbated. All interventional carefully executed without any exacerbation of pain or other symptoms. Pt given intermittent multimodal cues to teach best possible form with exercises. Pt continues to make steady progress toward most goals. No home exercise updates made at this time.     Rehab Potential  Good    PT Frequency  1x / week    PT Duration  8 weeks    PT Treatment/Interventions  Cryotherapy;Moist Heat;Gait training;Stair training;Functional mobility training;Therapeutic activities;Therapeutic exercise;Balance training;Neuromuscular re-education;Patient/family education;Energy conservation;Vestibular    PT Next Visit Plan  address HEP/balance/strengthening    PT Home Exercise Plan  No set up    Consulted and Agree with Plan of Care  Patient       Patient will benefit from skilled therapeutic intervention in order to improve the following deficits and impairments:  Abnormal gait, Decreased balance, Decreased endurance, Decreased mobility, Difficulty walking, Cardiopulmonary status limiting activity, Decreased activity tolerance, Decreased strength  Visit Diagnosis: Muscle weakness (generalized)  Unsteadiness on feet  History of falling     Problem List Patient Active Problem List   Diagnosis Date Noted  . Syncope 01/18/2019  . Thrombocytopenia ([place])  01/18/2019  . Microcytic anemia 01/18/2019  . Vertigo 12/05/2018  . Actinic keratosis 05/03/2018  . Chronic kidney disease, stage III (moderate) 03/29/2018  . Advanced directives, counseling/discussion 02/06/2014  . Routine general medical examination at a health care facility 07/27/2012  . Osteoporosis   . Hypertension   . Hyperlipidemia   . Allergic rhinitis, cause unspecified   . Hypothyroidism   . Prediabetes   . Osteoarthritis, multiple sites   . GERD (gastroesophageal reflux disease)    1:29 PM, 04/09/19 Etta Grandchild, PT, DPT Physical Therapist - [facility]  Outpatient Physical Therapy- [place] [phone]     Etta Grandchild 04/09/2019, 1:21 PM  [place] MAIN [facility] SERVICES [address] [place], [place], [address] Phone: [phone]   Fax:  [phone]  Name: Sandra Montgomery MRN: [id number] Date of Birth: [birth year]

## 2019-04-10 ENCOUNTER — Ambulatory Visit: Payer: Medicare Other | Admitting: Internal Medicine

## 2019-04-15 ENCOUNTER — Other Ambulatory Visit
Admission: RE | Admit: 2019-04-15 | Discharge: 2019-04-15 | Disposition: A | Discharge status: Home / Self Care | Payer: Medicare Other | Source: Ambulatory Visit | Attending: Internal Medicine | Admitting: Internal Medicine

## 2019-04-15 ENCOUNTER — Other Ambulatory Visit: Payer: Self-pay

## 2019-04-15 DIAGNOSIS — Z01812 Encounter for preprocedural laboratory examination: Secondary | ICD-10-CM | POA: Diagnosis not present

## 2019-04-15 DIAGNOSIS — Z20822 Contact with and (suspected) exposure to covid-19: Secondary | ICD-10-CM | POA: Insufficient documentation

## 2019-04-15 LAB — SARS CORONAVIRUS 2 (TAT 6-24 HRS): SARS Coronavirus 2: NEGATIVE

## 2019-04-16 ENCOUNTER — Other Ambulatory Visit: Payer: Medicare Other

## 2019-04-16 ENCOUNTER — Other Ambulatory Visit: Payer: Self-pay | Admitting: Internal Medicine

## 2019-04-18 ENCOUNTER — Ambulatory Visit: Payer: Medicare Other | Admitting: Certified Registered Nurse Anesthetist

## 2019-04-18 ENCOUNTER — Encounter: Payer: Self-pay | Admitting: Internal Medicine

## 2019-04-18 ENCOUNTER — Ambulatory Visit: Admit: 2019-04-18 | Payer: Medicare Other | Admitting: Internal Medicine

## 2019-04-18 ENCOUNTER — Encounter
Admission: RE | Disposition: A | Discharge status: Home / Self Care | Payer: Self-pay | Source: Home / Self Care | Attending: Internal Medicine

## 2019-04-18 ENCOUNTER — Ambulatory Visit
Admission: RE | Admit: 2019-04-18 | Discharge: 2019-04-18 | Disposition: A | Discharge status: Home / Self Care | Payer: Medicare Other | Attending: Internal Medicine | Admitting: Internal Medicine

## 2019-04-18 DIAGNOSIS — E785 Hyperlipidemia, unspecified: Secondary | ICD-10-CM | POA: Insufficient documentation

## 2019-04-18 DIAGNOSIS — Z79899 Other long term (current) drug therapy: Secondary | ICD-10-CM | POA: Diagnosis not present

## 2019-04-18 DIAGNOSIS — I10 Essential (primary) hypertension: Secondary | ICD-10-CM | POA: Insufficient documentation

## 2019-04-18 DIAGNOSIS — Z91048 Other nonmedicinal substance allergy status: Secondary | ICD-10-CM | POA: Insufficient documentation

## 2019-04-18 DIAGNOSIS — K640 First degree hemorrhoids: Secondary | ICD-10-CM | POA: Insufficient documentation

## 2019-04-18 DIAGNOSIS — R634 Abnormal weight loss: Secondary | ICD-10-CM | POA: Insufficient documentation

## 2019-04-18 DIAGNOSIS — J309 Allergic rhinitis, unspecified: Secondary | ICD-10-CM | POA: Diagnosis not present

## 2019-04-18 DIAGNOSIS — M810 Age-related osteoporosis without current pathological fracture: Secondary | ICD-10-CM | POA: Insufficient documentation

## 2019-04-18 DIAGNOSIS — R197 Diarrhea, unspecified: Secondary | ICD-10-CM | POA: Insufficient documentation

## 2019-04-18 DIAGNOSIS — M1990 Unspecified osteoarthritis, unspecified site: Secondary | ICD-10-CM | POA: Diagnosis not present

## 2019-04-18 DIAGNOSIS — Z9109 Other allergy status, other than to drugs and biological substances: Secondary | ICD-10-CM | POA: Insufficient documentation

## 2019-04-18 DIAGNOSIS — Z888 Allergy status to other drugs, medicaments and biological substances status: Secondary | ICD-10-CM | POA: Insufficient documentation

## 2019-04-18 DIAGNOSIS — K449 Diaphragmatic hernia without obstruction or gangrene: Secondary | ICD-10-CM | POA: Diagnosis not present

## 2019-04-18 DIAGNOSIS — K5730 Diverticulosis of large intestine without perforation or abscess without bleeding: Secondary | ICD-10-CM | POA: Insufficient documentation

## 2019-04-18 DIAGNOSIS — E039 Hypothyroidism, unspecified: Secondary | ICD-10-CM | POA: Insufficient documentation

## 2019-04-18 DIAGNOSIS — D649 Anemia, unspecified: Secondary | ICD-10-CM | POA: Diagnosis not present

## 2019-04-18 DIAGNOSIS — K648 Other hemorrhoids: Secondary | ICD-10-CM | POA: Diagnosis not present

## 2019-04-18 DIAGNOSIS — D509 Iron deficiency anemia, unspecified: Secondary | ICD-10-CM | POA: Diagnosis not present

## 2019-04-18 DIAGNOSIS — R7301 Impaired fasting glucose: Secondary | ICD-10-CM | POA: Insufficient documentation

## 2019-04-18 DIAGNOSIS — K2100 Gastro-esophageal reflux disease with esophagitis, without bleeding: Secondary | ICD-10-CM | POA: Insufficient documentation

## 2019-04-18 DIAGNOSIS — D500 Iron deficiency anemia secondary to blood loss (chronic): Secondary | ICD-10-CM | POA: Insufficient documentation

## 2019-04-18 DIAGNOSIS — Z6826 Body mass index (BMI) 26.0-26.9, adult: Secondary | ICD-10-CM | POA: Insufficient documentation

## 2019-04-18 DIAGNOSIS — K5790 Diverticulosis of intestine, part unspecified, without perforation or abscess without bleeding: Secondary | ICD-10-CM | POA: Diagnosis not present

## 2019-04-18 DIAGNOSIS — K219 Gastro-esophageal reflux disease without esophagitis: Secondary | ICD-10-CM | POA: Diagnosis not present

## 2019-04-18 HISTORY — PX: ESOPHAGOGASTRODUODENOSCOPY (EGD) WITH PROPOFOL: SHX5813

## 2019-04-18 HISTORY — PX: COLONOSCOPY WITH PROPOFOL: SHX5780

## 2019-04-18 SURGERY — COLONOSCOPY WITH PROPOFOL
Anesthesia: General

## 2019-04-18 MED ORDER — GLYCOPYRROLATE 0.2 MG/ML IJ SOLN
INTRAMUSCULAR | Status: DC | PRN
  Administered 2019-04-18: .2 mg via INTRAVENOUS

## 2019-04-18 MED ORDER — PROPOFOL 500 MG/50ML IV EMUL
INTRAVENOUS | Status: DC | PRN
  Administered 2019-04-18: 150 ug/kg/min via INTRAVENOUS

## 2019-04-18 MED ORDER — SODIUM CHLORIDE 0.9 % IV SOLN: INTRAVENOUS | Status: DC

## 2019-04-18 MED ORDER — PROPOFOL 10 MG/ML IV BOLUS
INTRAVENOUS | Status: DC | PRN
  Administered 2019-04-18 (×2): 50 mg via INTRAVENOUS

## 2019-04-18 MED ORDER — LIDOCAINE HCL (CARDIAC) PF 100 MG/5ML IV SOSY
PREFILLED_SYRINGE | INTRAVENOUS | Status: DC | PRN
  Administered 2019-04-18: 60 mg via INTRATRACHEAL

## 2019-04-18 NOTE — H&P (Signed)
Outpatient short stay form Pre-procedure 04/18/2019 10:09 AM Sandra Montgomery K. Sandra Montgomery, M.D.  Primary Physician: Sandra Montgomery, M.D.  Reason for visit:  Iron deficiency anemia, NSAID use, Change in bowel habits.  History of present illness:  As above. Patient denies melena or hematochezia. Has some mild diarrhea. No fever, chills or sweats. Has GERD controlled on Omeprazole. No dysphagia.    Current Facility-Administered Medications:  .  0.9 %  sodium chloride infusion, , Intravenous, Continuous, Sandra Montgomery, Sandra Pike, MD, Last Rate: 20 mL/hr at 04/18/19 [id number], Restarted at 04/18/19 [id number]  Medications Prior to Admission  Medication Sig Dispense Refill Last Dose  . atorvastatin (LIPITOR) 80 MG tablet 1 by mouth daily (Patient taking differently: Take 80 mg by mouth at bedtime. ) 90 tablet 3 04/17/2019 at Unknown time  . Cholecalciferol (VITAMIN D3 PO) Take 1 capsule by mouth every morning. 1000 IU   04/17/2019 at Unknown time  . olmesartan (BENICAR) 40 MG tablet TAKE 1 TABLET DAILY 90 tablet 3 04/18/2019 at Unknown time  . omeprazole (PRILOSEC) 40 MG capsule Take 1 capsule (40 mg total) by mouth daily. 90 capsule 3 04/17/2019 at Unknown time  . Probiotic Product (PROBIOTIC DAILY PO) Take 1 capsule by mouth daily.   Past Week at Unknown time  . zinc gluconate 50 MG tablet Take 50 mg by mouth daily.   Past Week at Unknown time  . docusate sodium (COLACE) 100 MG capsule Take 2 capsules (200 mg total) by mouth 2 (two) times daily. 120 capsule 0 04/12/2019  . Ferrous Sulfate (IRON) 325 (65 Fe) MG TABS Take 1 tablet (325 mg total) by mouth daily. 60 tablet 0 04/12/2019  . LORazepam (ATIVAN) 0.5 MG tablet Take 0.5-1 tablets (0.25-0.5 mg total) by mouth 3 (three) times daily as needed for anxiety. (Patient not taking: Reported on 04/18/2019) 20 tablet 0 Not Taking at Unknown time  . naproxen sodium (ALEVE) 220 MG tablet Take 220-440 mg by mouth See admin instructions. Take two tablets (440 mg) by mouth every morning, may  also take one or two tablets (220-440 mg) at night as needed for pain/headache   04/11/2019     Allergies  Allergen Reactions  . Short Ragweed Pollen Ext Other (See Comments)    Congestion/cough  . Tape Rash and Other (See Comments)    Peels the skin      Past Medical History:  Diagnosis Date  . Allergic rhinitis, cause unspecified   . Cancer (HCC)    shoulder carcinoma  . GERD (gastroesophageal reflux disease)   . History of blood transfusion   . Hyperlipidemia   . Hypertension   . Impaired fasting glucose   . Osteoarthrosis involving, or with mention of more than one site, but not specified as generalized, multiple sites   . Osteoporosis   . Pneumothorax 1965   prolonged bed rest   . Unspecified hypothyroidism     Review of systems:  Otherwise negative.    Physical Exam  Gen: Alert, oriented. Appears stated age.  HEENT: [place]/AT. PERRLA. Lungs: CTA, no wheezes. CV: RR nl S1, S2. Abd: soft, benign, no masses. BS+ Ext: No edema. Pulses 2+    Planned procedures: Proceed with EGD and colonoscopy. The patient understands the nature of the planned procedure, indications, risks, alternatives and potential complications including but not limited to bleeding, infection, perforation, damage to internal organs and possible oversedation/side effects from anesthesia. The patient agrees and gives consent to proceed.  Please refer to procedure notes for findings, recommendations and  patient disposition/instructions.     Sandra Montgomery K. Sandra Montgomery, M.D. Gastroenterology 04/18/2019  10:09 AM

## 2019-04-18 NOTE — Transfer of Care (Signed)
Immediate Anesthesia Transfer of Care Note  Patient: Sandra Montgomery  Procedure(s) Performed: COLONOSCOPY WITH PROPOFOL (N/A ) ESOPHAGOGASTRODUODENOSCOPY (EGD) WITH PROPOFOL (N/A )  Patient Location: PACU  Anesthesia Type:General  Level of Consciousness: sedated  Airway & Oxygen Therapy: Patient Spontanous Breathing  Post-op Assessment: Report given to RN and Post -op Vital signs reviewed and stable  Post vital signs: Reviewed and stable  Last Vitals:  Vitals Value Taken Time  BP 111/71 04/18/19 1053  Temp 36.6 C 04/18/19 1053  Pulse 88 04/18/19 1054  Resp 12 04/18/19 1054  SpO2 100 % 04/18/19 1054  Vitals shown include unvalidated device data.  Last Pain:  Vitals:   04/18/19 1053  TempSrc: Temporal  PainSc: Asleep         Complications: No apparent anesthesia complications

## 2019-04-18 NOTE — Interval H&P Note (Signed)
History and Physical Interval Note:  04/18/2019 10:10 AM  Sandra Montgomery  has presented today for surgery, with the diagnosis of anemia.  The various methods of treatment have been discussed with the patient and family. After consideration of risks, benefits and other options for treatment, the patient has consented to  Procedure(s): COLONOSCOPY WITH PROPOFOL (N/A) ESOPHAGOGASTRODUODENOSCOPY (EGD) WITH PROPOFOL (N/A) as a surgical intervention.  The patient's history has been reviewed, patient examined, no change in status, stable for surgery.  I have reviewed the patient's chart and labs.  Questions were answered to the patient's satisfaction.     [place], [place]

## 2019-04-18 NOTE — Anesthesia Preprocedure Evaluation (Signed)
Anesthesia Evaluation  Patient identified by MRN, date of birth, ID band Patient awake    Reviewed: Allergy & Precautions, NPO status , Patient's Chart, lab work & pertinent test results  Airway Mallampati: III       Dental   Pulmonary neg pulmonary ROS,    Pulmonary exam normal        Cardiovascular hypertension, Normal cardiovascular exam     Neuro/Psych negative neurological ROS  negative psych ROS   GI/Hepatic Neg liver ROS, GERD  ,  Endo/Other  Hypothyroidism   Renal/GU Renal InsufficiencyRenal disease  negative genitourinary   Musculoskeletal  (+) Arthritis ,   Abdominal Normal abdominal exam  (+)   Peds negative pediatric ROS (+)  Hematology  (+) anemia ,   Anesthesia Other Findings Past Medical History: No date: Allergic rhinitis, cause unspecified No date: Cancer [facility])     Comment:  shoulder carcinoma No date: GERD (gastroesophageal reflux disease) No date: History of blood transfusion No date: Hyperlipidemia No date: Hypertension No date: Impaired fasting glucose No date: Osteoarthrosis involving, or with mention of more than one  site, but not specified as generalized, multiple sites No date: Osteoporosis 1965: Pneumothorax     Comment:  prolonged bed rest  No date: Unspecified hypothyroidism  Reproductive/Obstetrics                             Anesthesia Physical Anesthesia Plan  ASA: III  Anesthesia Plan: General   Post-op Pain Management:    Induction: Intravenous  PONV Risk Score and Plan: Propofol infusion  Airway Management Planned: Nasal Cannula  Additional Equipment:   Intra-op Plan:   Post-operative Plan:   Informed Consent: I have reviewed the patients History and Physical, chart, labs and discussed the procedure including the risks, benefits and alternatives for the proposed anesthesia with the patient or authorized representative who has  indicated his/her understanding and acceptance.     Dental advisory given  Plan Discussed with: CRNA and Surgeon  Anesthesia Plan Comments:         Anesthesia Quick Evaluation

## 2019-04-18 NOTE — Anesthesia Postprocedure Evaluation (Signed)
Anesthesia Post Note  Patient: Sandra Montgomery  Procedure(s) Performed: COLONOSCOPY WITH PROPOFOL (N/A ) ESOPHAGOGASTRODUODENOSCOPY (EGD) WITH PROPOFOL (N/A )  Patient location during evaluation: Endoscopy Anesthesia Type: General Level of consciousness: awake and alert and oriented Pain management: pain level controlled Vital Signs Assessment: post-procedure vital signs reviewed and stable Respiratory status: spontaneous breathing Cardiovascular status: blood pressure returned to baseline Anesthetic complications: no     Last Vitals:  Vitals:   04/18/19 0859 04/18/19 1053  BP: (!) 163/83 111/71  Pulse: 78 88  Resp: 18 11  Temp: (!) 35.8 C 36.6 C  SpO2: 98% 100%    Last Pain:  Vitals:   04/18/19 1053  TempSrc: Temporal  PainSc: Asleep                 Linzie Boursiquot

## 2019-04-18 NOTE — Op Note (Addendum)
[facility] Gastroenterology Patient Name: Sandra Montgomery Procedure Date: 04/18/2019 7:45 AM MRN: [id number] Account #: [id number] Date of Birth: [birth year] Admit Type: Outpatient Age: 79 Room: [facility] ENDO ROOM 3 Gender: Female Note Status: Finalized THIS EXAM WAS SENT IN ERROR

## 2019-04-19 NOTE — Op Note (Signed)
[facility] Gastroenterology Patient Name: Sandra Montgomery Procedure Date: 04/18/2019 10:15 AM MRN: [id number] Account #: [id number] Date of Birth: 04/02/1941 Admit Type: Outpatient Age: 79 Room: [facility] ENDO ROOM 3 Gender: Female Note Status: Finalized Procedure:             Upper GI endoscopy Indications:           Iron deficiency anemia, Esophageal reflux, Weight loss Providers:             Benay Pike. [name] MD, MD Referring MD:          Nino Glow (Referring MD), Venia Carbon                         (Referring MD) Medicines:             Propofol per Anesthesia Complications:         No immediate complications. Procedure:             Pre-Anesthesia Assessment:                        - The risks and benefits of the procedure and the                         sedation options and risks were discussed with the                         patient. All questions were answered and informed                         consent was obtained.                        - Patient identification and proposed procedure were                         verified prior to the procedure by the nurse. The                         procedure was verified in the procedure room.                        - ASA Grade Assessment: III - A patient with severe                         systemic disease.                        - After reviewing the risks and benefits, the patient                         was deemed in satisfactory condition to undergo the                         procedure.                        After obtaining informed consent, the endoscope was                         passed under direct  vision. Throughout the procedure,                         the patient's blood pressure, pulse, and oxygen                         saturations were monitored continuously. The Endoscope                         was introduced through the mouth, and advanced to the                         third part  of duodenum. The upper GI endoscopy was                         accomplished without difficulty. The patient tolerated                         the procedure well. Findings:      The esophagus was normal.      A 1 cm hiatal hernia was present.      The examined duodenum was normal.      The exam was otherwise without abnormality. Impression:            - Normal esophagus.                        - 1 cm hiatal hernia.                        - Normal examined duodenum.                        - The examination was otherwise normal.                        - No specimens collected. Recommendation:        - Proceed with colonoscopy Procedure Code(s):     --- Professional ---                        [phone], Esophagogastroduodenoscopy, flexible,                         transoral; diagnostic, including collection of                         specimen(s) by brushing or washing, when performed                         (separate procedure) Diagnosis Code(s):     --- Professional ---                        R63.4, Abnormal weight loss                        K21.9, Gastro-esophageal reflux disease without                         esophagitis  D50.9, Iron deficiency anemia, unspecified                        K44.9, Diaphragmatic hernia without obstruction or                         gangrene CPT copyright 2019 American [employer]. All rights reserved. The codes documented in this report are preliminary and upon coder review may  be revised to meet current compliance requirements. Efrain Sella MD, MD 04/18/2019 10:32:11 AM This report has been signed electronically. Number of Addenda: 0 Note Initiated On: 04/18/2019 10:15 AM Estimated Blood Loss:  Estimated blood loss: none.      [facility]

## 2019-04-19 NOTE — Op Note (Signed)
[facility] Gastroenterology Patient Name: Sandra Montgomery Procedure Date: 04/18/2019 10:14 AM MRN: [id number] Account #: [id number] Date of Birth: 08/05/1940 Admit Type: Outpatient Age: 79 Room: [facility] ENDO ROOM 3 Gender: Female Note Status: Finalized Procedure:             Colonoscopy Indications:           Iron deficiency anemia secondary to chronic blood loss Providers:             Benay Pike. Embrie Mikkelsen MD, MD Medicines:             Propofol per Anesthesia Complications:         No immediate complications. Procedure:             Pre-Anesthesia Assessment:                        - The risks and benefits of the procedure and the                         sedation options and risks were discussed with the                         patient. All questions were answered and informed                         consent was obtained.                        - Patient identification and proposed procedure were                         verified prior to the procedure by the nurse. The                         procedure was verified in the procedure room.                        - ASA Grade Assessment: III - A patient with severe                         systemic disease.                        - After reviewing the risks and benefits, the patient                         was deemed in satisfactory condition to undergo the                         procedure.                        After obtaining informed consent, the colonoscope was                         passed under direct vision. Throughout the procedure,                         the patient's blood pressure, pulse, and oxygen  saturations were monitored continuously. The                         Colonoscope was introduced through the anus and                         advanced to the the cecum, identified by appendiceal                         orifice and ileocecal valve. The colonoscopy was   performed without difficulty. The patient tolerated                         the procedure well. The quality of the bowel                         preparation was good. The ileocecal valve, appendiceal                         orifice, and rectum were photographed. Findings:      The perianal and digital rectal examinations were normal. Pertinent       negatives include normal sphincter tone and no palpable rectal lesions.      Many small and large-mouthed diverticula were found in the sigmoid colon.      Non-bleeding internal hemorrhoids were found during retroflexion. The       hemorrhoids were Grade I (internal hemorrhoids that do not prolapse).      The exam was otherwise without abnormality. Impression:            - Diverticulosis in the sigmoid colon.                        - Non-bleeding internal hemorrhoids.                        - The examination was otherwise normal.                        - No specimens collected. Recommendation:        - Patient has a contact number available for                         emergencies. The signs and symptoms of potential                         delayed complications were discussed with the patient.                         Return to normal activities tomorrow. Written                         discharge instructions were provided to the patient.                        - Resume previous diet.                        - Continue present medications.                        -  To visualize the small bowel, perform video capsule                         endoscopy at appointment to be scheduled.                        - Return to physician assistant in 6 weeks.                        - The findings and recommendations were discussed with                         the patient. Procedure Code(s):     --- Professional ---                        [phone], Colonoscopy, flexible; diagnostic, including                         collection of specimen(s) by brushing or  washing, when                         performed (separate procedure) Diagnosis Code(s):     --- Professional ---                        K57.30, Diverticulosis of large intestine without                         perforation or abscess without bleeding                        D50.0, Iron deficiency anemia secondary to blood loss                         (chronic)                        K64.0, First degree hemorrhoids CPT copyright 2019 American Medical Association. All rights reserved. The codes documented in this report are preliminary and upon coder review may  be revised to meet current compliance requirements. Efrain Sella MD, MD 04/18/2019 10:50:44 AM This report has been signed electronically. Number of Addenda: 0 Note Initiated On: 04/18/2019 10:14 AM Scope Withdrawal Time: 0 hours 7 minutes 21 seconds  Total Procedure Duration: 0 hours 14 minutes 31 seconds  Estimated Blood Loss:  Estimated blood loss: none.      [facility]

## 2019-05-07 DIAGNOSIS — D509 Iron deficiency anemia, unspecified: Secondary | ICD-10-CM | POA: Diagnosis not present

## 2019-05-13 ENCOUNTER — Other Ambulatory Visit: Payer: Self-pay | Admitting: Internal Medicine

## 2019-05-13 DIAGNOSIS — M170 Bilateral primary osteoarthritis of knee: Secondary | ICD-10-CM | POA: Diagnosis not present

## 2019-05-13 DIAGNOSIS — M67432 Ganglion, left wrist: Secondary | ICD-10-CM | POA: Diagnosis not present

## 2019-05-16 DIAGNOSIS — H40003 Preglaucoma, unspecified, bilateral: Secondary | ICD-10-CM | POA: Diagnosis not present

## 2019-05-23 ENCOUNTER — Inpatient Hospital Stay: Payer: Medicare Other | Admitting: Oncology

## 2019-05-27 ENCOUNTER — Inpatient Hospital Stay: Payer: Medicare Other

## 2019-05-27 ENCOUNTER — Encounter: Payer: Self-pay | Admitting: Oncology

## 2019-05-27 ENCOUNTER — Other Ambulatory Visit: Payer: Self-pay

## 2019-05-27 ENCOUNTER — Telehealth: Payer: Self-pay | Admitting: *Deleted

## 2019-05-27 ENCOUNTER — Encounter (INDEPENDENT_AMBULATORY_CARE_PROVIDER_SITE_OTHER): Payer: Self-pay

## 2019-05-27 ENCOUNTER — Inpatient Hospital Stay: Payer: Medicare Other | Attending: Oncology | Admitting: Oncology

## 2019-05-27 VITALS — BP 138/77 | HR 85 | Temp 97.1°F | Resp 16 | Wt 154.2 lb

## 2019-05-27 DIAGNOSIS — I129 Hypertensive chronic kidney disease with stage 1 through stage 4 chronic kidney disease, or unspecified chronic kidney disease: Secondary | ICD-10-CM | POA: Diagnosis not present

## 2019-05-27 DIAGNOSIS — K5730 Diverticulosis of large intestine without perforation or abscess without bleeding: Secondary | ICD-10-CM | POA: Diagnosis not present

## 2019-05-27 DIAGNOSIS — K648 Other hemorrhoids: Secondary | ICD-10-CM | POA: Insufficient documentation

## 2019-05-27 DIAGNOSIS — D509 Iron deficiency anemia, unspecified: Secondary | ICD-10-CM | POA: Insufficient documentation

## 2019-05-27 DIAGNOSIS — K449 Diaphragmatic hernia without obstruction or gangrene: Secondary | ICD-10-CM

## 2019-05-27 DIAGNOSIS — E1122 Type 2 diabetes mellitus with diabetic chronic kidney disease: Secondary | ICD-10-CM | POA: Diagnosis not present

## 2019-05-27 DIAGNOSIS — N1831 Chronic kidney disease, stage 3a: Secondary | ICD-10-CM | POA: Insufficient documentation

## 2019-05-27 LAB — URINALYSIS, COMPLETE (UACMP) WITH MICROSCOPIC
Bilirubin Urine: NEGATIVE
Glucose, UA: 50 mg/dL — AB
Hgb urine dipstick: NEGATIVE
Ketones, ur: NEGATIVE mg/dL
Nitrite: NEGATIVE
Protein, ur: NEGATIVE mg/dL
Specific Gravity, Urine: 1.016 (ref 1.005–1.030)
pH: 5 (ref 5.0–8.0)

## 2019-05-27 LAB — CBC WITH DIFFERENTIAL/PLATELET
Abs Immature Granulocytes: 0.06 10*3/uL (ref 0.00–0.07)
Basophils Absolute: 0.1 10*3/uL (ref 0.0–0.1)
Basophils Relative: 1 %
Eosinophils Absolute: 0.2 10*3/uL (ref 0.0–0.5)
Eosinophils Relative: 2 %
HCT: 36.2 % (ref 36.0–46.0)
Hemoglobin: 10.7 g/dL — ABNORMAL LOW (ref 12.0–15.0)
Immature Granulocytes: 1 %
Lymphocytes Relative: 14 %
Lymphs Abs: 1.5 10*3/uL (ref 0.7–4.0)
MCH: 24.4 pg — ABNORMAL LOW (ref 26.0–34.0)
MCHC: 29.6 g/dL — ABNORMAL LOW (ref 30.0–36.0)
MCV: 82.6 fL (ref 80.0–100.0)
Monocytes Absolute: 0.8 10*3/uL (ref 0.1–1.0)
Monocytes Relative: 7 %
Neutro Abs: 8 10*3/uL — ABNORMAL HIGH (ref 1.7–7.7)
Neutrophils Relative %: 75 %
Platelets: 312 10*3/uL (ref 150–400)
RBC: 4.38 MIL/uL (ref 3.87–5.11)
RDW: 25.1 % — ABNORMAL HIGH (ref 11.5–15.5)
WBC: 10.5 10*3/uL (ref 4.0–10.5)
nRBC: 0 % (ref 0.0–0.2)

## 2019-05-27 LAB — FERRITIN: Ferritin: 14 ng/mL (ref 11–307)

## 2019-05-27 LAB — IRON AND TIBC
Iron: 111 ug/dL (ref 28–170)
Saturation Ratios: 25 % (ref 10.4–31.8)
TIBC: 445 ug/dL (ref 250–450)
UIBC: 334 ug/dL

## 2019-05-27 MED ORDER — IRON 325 (65 FE) MG PO TABS: 1.0000 | ORAL_TABLET | Freq: Two times a day (BID) | ORAL | 0 refills | Status: DC

## 2019-05-27 NOTE — Progress Notes (Signed)
Patient newly diagnosed with anemia and had to have a blood transfusion.
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
## 2019-05-28 ENCOUNTER — Other Ambulatory Visit: Payer: Self-pay

## 2019-05-28 ENCOUNTER — Telehealth: Payer: Self-pay

## 2019-05-28 MED ORDER — IRON (FERROUS SULFATE) 325 (65 FE) MG PO TABS: 325.0000 mg | ORAL_TABLET | Freq: Two times a day (BID) | ORAL | 0 refills | Status: DC

## 2019-05-28 NOTE — Telephone Encounter (Signed)
Patient has been notified of results and voiced understanding.

## 2019-05-28 NOTE — Telephone Encounter (Signed)
-----   Message from Earlie Server, MD sent at 05/27/2019  7:59 PM EST ----- Please let patient know that her hemoglobin has further improved to 10.7, iron panel has also improved. Given that oral iron supplementation is working for her, I recommend to continue ferrous sulfate 325 mg twice daily.  I have sent a prescription to her pharmacy. Please arrange patient to follow-up in 3 months, lab-MD virtually or in person per her preference.  That has been ordered.

## 2019-06-10 DIAGNOSIS — M170 Bilateral primary osteoarthritis of knee: Secondary | ICD-10-CM | POA: Diagnosis not present

## 2019-06-10 DIAGNOSIS — M67432 Ganglion, left wrist: Secondary | ICD-10-CM | POA: Diagnosis not present

## 2019-06-24 ENCOUNTER — Ambulatory Visit: Payer: Medicare Other | Attending: Internal Medicine

## 2019-06-24 DIAGNOSIS — Z23 Encounter for immunization: Secondary | ICD-10-CM

## 2019-06-24 NOTE — Progress Notes (Signed)
   Covid-19 Vaccination Clinic  Name:  Sandra Montgomery    MRN: [id number] DOB: [birth year]  06/24/2019  [name] was observed post Covid-19 immunization for 15 minutes without incident. She was provided with Vaccine Information Sheet and instruction to access the V-Safe system.   [name] was instructed to call 911 with any severe reactions post vaccine: Marland Kitchen Difficulty breathing  . Swelling of face and throat  . A fast heartbeat  . A bad rash all over body  . Dizziness and weakness   Immunizations Administered    Name Date Dose VIS Date Route   Pfizer COVID-19 Vaccine 06/24/2019 11:34 AM 0.3 mL 03/15/2019 Intramuscular   Manufacturer: [place]   Lot: [id number]   [place]: [id number]

## 2019-06-28 ENCOUNTER — Telehealth: Payer: Self-pay

## 2019-06-28 NOTE — Telephone Encounter (Signed)
Pt's daughter, Malachy Mood, called reporting her mother called her this morning from her car upon arriving home and she was SOB, coughing and had vomiting. Pt also reports weakness, dry mouth, HA, and dizziness.  Pt had received her first covid vaccine on 3/22. Pt reports she was getting her hair done this morning and was dizzy there but drove home anyway. When she got home she got out of the car to vomit and was SOB.Pt reported when she went back to the car to sit down she reports her legs could not hold her so she couldn't get out of the vehicle. This is when she called her daughter.  Her daughter picked her up and took her to UC but did not bring pt in. Malachy Mood went in and told them what was going on and they said if pt needed x-rays or IV fluids they could not do that there and it would be better for pt to go to the ER.   Pt was not SOB on the phone and reported that she is feeling somewhat better. She still has a slight headache and weak and has a dry mouth. Pt is not sure if she can stand now. Pt denies at this time any SOB or vomiting, or chest pain. Pt reports she has not eaten this morning and wants to get something to eat and then see how she is feeling. Pt refused to go to the ER. Advised to go to the ER if any worsening symptoms, any symptoms or any chest pain. Advised pt's daughter to f/u with this office later today to update on her condition. Malachy Mood and the pt verbalized understanding.

## 2019-06-28 NOTE — Telephone Encounter (Signed)
Patient contacted the office and stated that she is feeling a little better. She stated that she was able to eat a meal and she is making sure she is drinking fluids and she is feeling much better than she was this morning. She has not had any more episodes of feeling SOB, vomiting, or feeling dizzy. Patient denies having a headache, and states that her dry mouth has since resolved.  Patient states her only issue is that she is wrapped up in a blanket, and she cannot get warm. Patient states she is unable to take her temperature right now because she does not have a thermometer at home.  Patient states she feels like her iron may be low, because she was never able to get a refill of this. She would like a 30 day supply sent in to the [facility] on [address], and then a 3 month supply sent in to [facility].

## 2019-06-28 NOTE — Telephone Encounter (Signed)
Left message on VM for Cheryl.

## 2019-06-28 NOTE — Telephone Encounter (Signed)
I will run this by Dr Silvio Pate for approval.

## 2019-06-28 NOTE — Telephone Encounter (Signed)
Please check on her later this afternoon and advise ER evaluation if not feeling better

## 2019-06-29 NOTE — Telephone Encounter (Signed)
I think she needs to be checked first. Either by me or the hematologist. Make sure she is okay on Monday---I am still worried and her symptoms are not likely to be related to anemia--even if she still is anemic

## 2019-07-01 NOTE — Telephone Encounter (Signed)
Tried to call pt. VM is not set up.

## 2019-07-02 ENCOUNTER — Other Ambulatory Visit: Payer: Self-pay | Admitting: Internal Medicine

## 2019-07-02 NOTE — Telephone Encounter (Signed)
Spoke to pt. She is going to check with [facility] about the rx for iron Dr Tasia Catchings sent 05-28-19. She will call back and schedule an appt with Dr Silvio Pate.

## 2019-07-08 DIAGNOSIS — M1712 Unilateral primary osteoarthritis, left knee: Secondary | ICD-10-CM | POA: Diagnosis not present

## 2019-07-16 ENCOUNTER — Ambulatory Visit: Payer: Medicare Other | Attending: Internal Medicine

## 2019-07-16 DIAGNOSIS — Z23 Encounter for immunization: Secondary | ICD-10-CM

## 2019-07-16 NOTE — Progress Notes (Signed)
   Covid-19 Vaccination Clinic  Name:  Sandra Montgomery    MRN: [id number] DOB: [birth year]  07/16/2019  [name] was observed post Covid-19 immunization for 15 minutes without incident. She was provided with Vaccine Information Sheet and instruction to access the V-Safe system.   [name] was instructed to call 911 with any severe reactions post vaccine: Marland Kitchen Difficulty breathing  . Swelling of face and throat  . A fast heartbeat  . A bad rash all over body  . Dizziness and weakness   Immunizations Administered    Name Date Dose VIS Date Route   Pfizer COVID-19 Vaccine 07/16/2019  2:40 PM 0.3 mL 03/15/2019 Intramuscular   Manufacturer: [place]   Lot: [id number]   [place]: [id number]

## 2019-07-24 DIAGNOSIS — M1712 Unilateral primary osteoarthritis, left knee: Secondary | ICD-10-CM | POA: Diagnosis not present

## 2019-07-31 DIAGNOSIS — M1712 Unilateral primary osteoarthritis, left knee: Secondary | ICD-10-CM | POA: Diagnosis not present

## 2019-08-07 DIAGNOSIS — M1712 Unilateral primary osteoarthritis, left knee: Secondary | ICD-10-CM | POA: Diagnosis not present

## 2019-08-07 DIAGNOSIS — M170 Bilateral primary osteoarthritis of knee: Secondary | ICD-10-CM | POA: Diagnosis not present

## 2019-08-27 ENCOUNTER — Inpatient Hospital Stay (HOSPITAL_BASED_OUTPATIENT_CLINIC_OR_DEPARTMENT_OTHER): Payer: Medicare Other | Admitting: Oncology

## 2019-08-27 ENCOUNTER — Encounter: Payer: Self-pay | Admitting: Oncology

## 2019-08-27 ENCOUNTER — Other Ambulatory Visit: Payer: Self-pay

## 2019-08-27 ENCOUNTER — Inpatient Hospital Stay: Payer: Medicare Other | Attending: Oncology

## 2019-08-27 VITALS — BP 147/74 | HR 77 | Temp 97.6°F | Resp 18 | Wt 159.7 lb

## 2019-08-27 DIAGNOSIS — N1831 Chronic kidney disease, stage 3a: Secondary | ICD-10-CM

## 2019-08-27 DIAGNOSIS — D509 Iron deficiency anemia, unspecified: Secondary | ICD-10-CM | POA: Insufficient documentation

## 2019-08-27 DIAGNOSIS — K648 Other hemorrhoids: Secondary | ICD-10-CM | POA: Diagnosis not present

## 2019-08-27 DIAGNOSIS — K5730 Diverticulosis of large intestine without perforation or abscess without bleeding: Secondary | ICD-10-CM | POA: Diagnosis not present

## 2019-08-27 LAB — CBC WITH DIFFERENTIAL/PLATELET
Abs Immature Granulocytes: 0.05 10*3/uL (ref 0.00–0.07)
Basophils Absolute: 0 10*3/uL (ref 0.0–0.1)
Basophils Relative: 0 %
Eosinophils Absolute: 0.2 10*3/uL (ref 0.0–0.5)
Eosinophils Relative: 2 %
HCT: 36.5 % (ref 36.0–46.0)
Hemoglobin: 11.9 g/dL — ABNORMAL LOW (ref 12.0–15.0)
Immature Granulocytes: 1 %
Lymphocytes Relative: 15 %
Lymphs Abs: 1.4 10*3/uL (ref 0.7–4.0)
MCH: 28.5 pg (ref 26.0–34.0)
MCHC: 32.6 g/dL (ref 30.0–36.0)
MCV: 87.3 fL (ref 80.0–100.0)
Monocytes Absolute: 0.5 10*3/uL (ref 0.1–1.0)
Monocytes Relative: 6 %
Neutro Abs: 7.1 10*3/uL (ref 1.7–7.7)
Neutrophils Relative %: 76 %
Platelets: 211 10*3/uL (ref 150–400)
RBC: 4.18 MIL/uL (ref 3.87–5.11)
RDW: 14.9 % (ref 11.5–15.5)
WBC: 9.2 10*3/uL (ref 4.0–10.5)
nRBC: 0 % (ref 0.0–0.2)

## 2019-08-27 LAB — IRON AND TIBC
Iron: 103 ug/dL (ref 28–170)
Saturation Ratios: 26 % (ref 10.4–31.8)
TIBC: 391 ug/dL (ref 250–450)
UIBC: 288 ug/dL

## 2019-08-27 LAB — FERRITIN: Ferritin: 16 ng/mL (ref 11–307)
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
## 2019-08-27 NOTE — Progress Notes (Signed)
Pt here for follow up. Reports feeling tired and cold.

## 2019-08-28 ENCOUNTER — Telehealth: Payer: Self-pay

## 2019-08-28 NOTE — Telephone Encounter (Signed)
-----   Message from Earlie Server, MD sent at 08/27/2019  1:06 PM EDT ----- Please advise patient to decrease to ferrous sulfate 325 mg once daily as maintenance.  Iron labs appear to be stable.  Follow-up as planned.

## 2019-08-28 NOTE — Telephone Encounter (Signed)
Message left informing patient of MD advise.

## 2019-10-27 IMAGING — MR MR HEAD W/O CM
12 of 13 series · 43 of 48 positions shown · non-contrast
Comparison: Prior CT from 01/18/2019.

CLINICAL DATA: Initial evaluation for acute syncope.

EXAM:
MRI HEAD WITHOUT CONTRAST
TECHNIQUE: Multiplanar, multiecho pulse sequences of the brain and surrounding
structures were obtained without intravenous contrast.

[Series 5: DWI · axial · 3.0mm · 0.88mm/px · z∈[-113,+32]mm · 7 of 100 slices shown (1 of 4)]
[im 1/100]
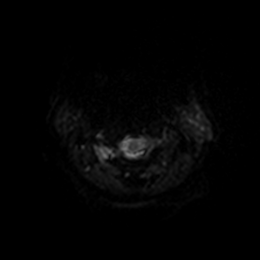
[im 17/100]
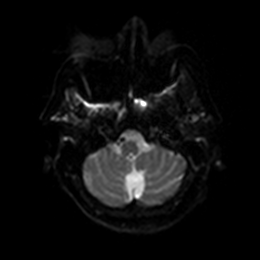
[im 34/100]
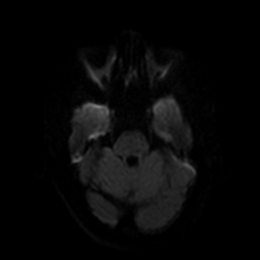
[im 50/100]
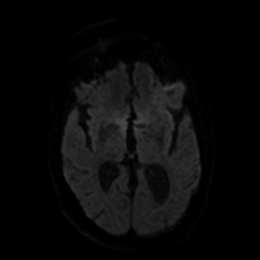
[im 67/100]
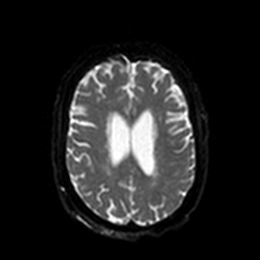
[im 83/100]
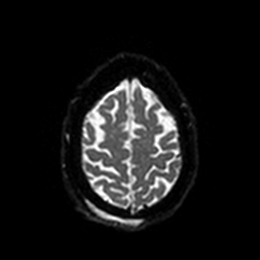
[im 100/100]
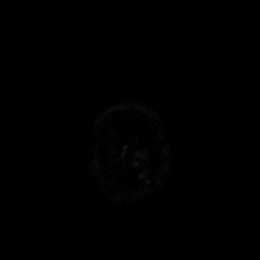

[Series 6: DWI · axial · 3.0mm · 0.88mm/px · z∈[-113,+32]mm · 3 of 50 slices shown (2 of 4)]
[im 1/50]
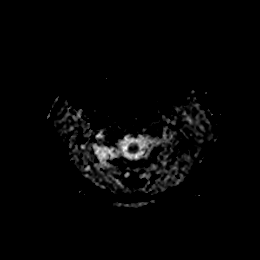
[im 25/50]
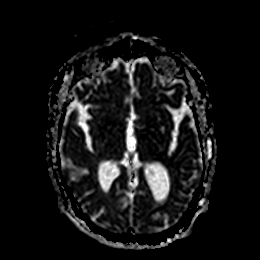
[im 50/50]
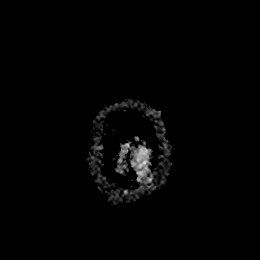

[Series 7: DWI · coronal · 4.0mm · 0.88mm/px · 5 of 64 slices shown (3 of 4)]
[im 1/64]
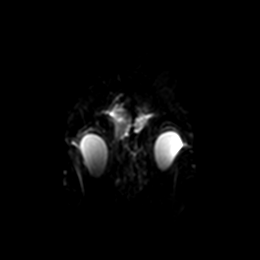
[im 16/64]
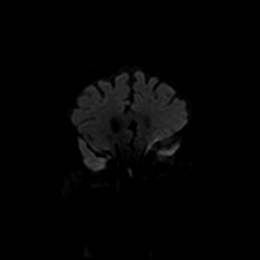
[im 32/64]
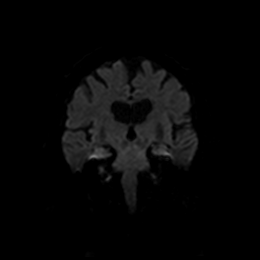
[im 48/64]
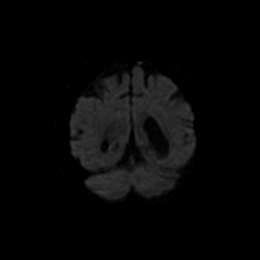
[im 64/64]
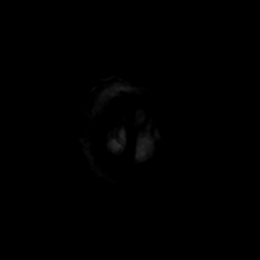

[Series 8: DWI · coronal · 4.0mm · 0.88mm/px · 2 of 32 slices shown (4 of 4)]
[im 1/32]
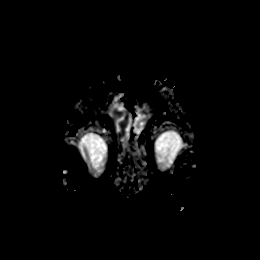
[im 32/32]
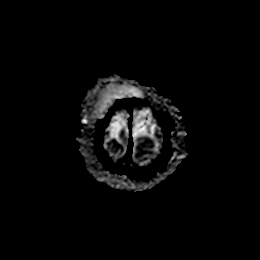

[Series 9: T1 · sagittal · 5.0mm · 0.75mm/px · 2 of 23 slices shown]
[im 1/23]
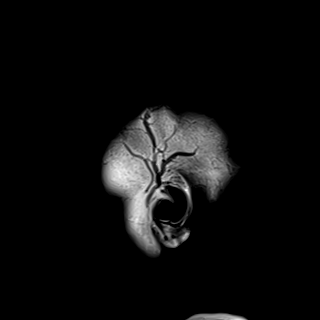
[im 23/23]
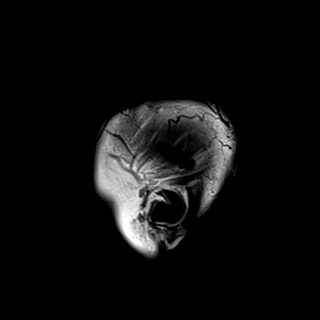

[Series 10: T2 · axial · 5.0mm · 0.72mm/px · z∈[-107,+34]mm · 2 of 25 slices shown (1 of 2)]
[im 1/25]
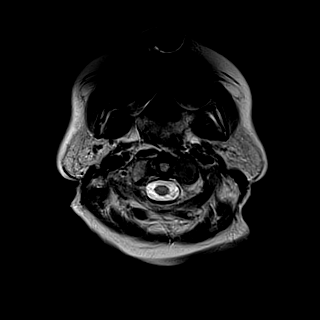
[im 25/25]
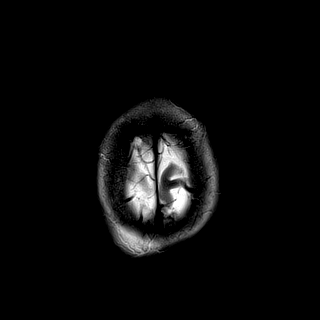

[Series 11: FLAIR · axial · 5.0mm · 0.45mm/px · z∈[-110,+31]mm · 2 of 25 slices shown]
[im 1/25]
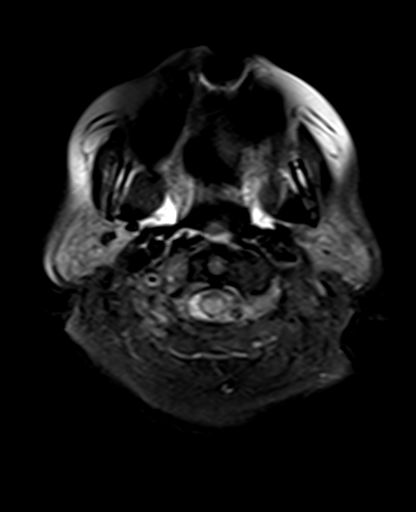
[im 25/25]
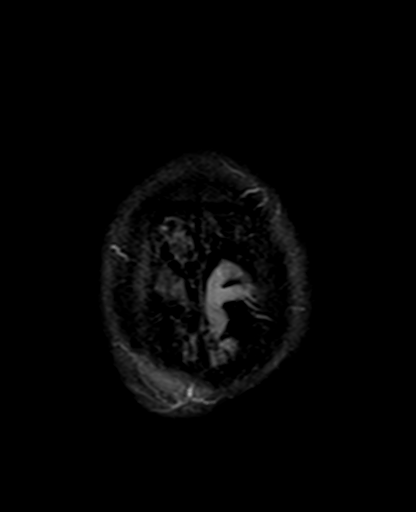

[Series 12: mag_images · axial · 3.0mm · 0.90mm/px · z∈[-127,+48]mm · 5 of 60 slices shown]
[im 1/60]
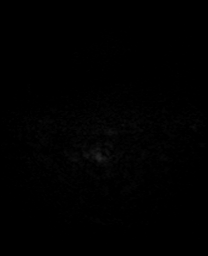
[im 15/60]
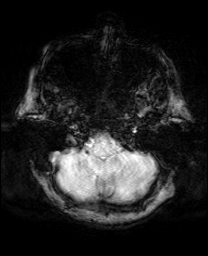
[im 30/60]
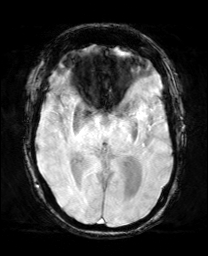
[im 45/60]
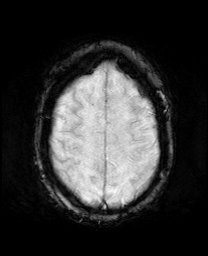
[im 60/60]
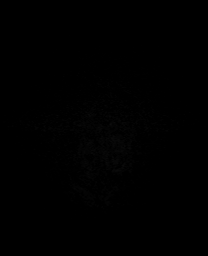

[Series 13: pha_images · axial · 3.0mm · 0.90mm/px · z∈[-127,+42]mm · 4 of 57 slices shown]
[im 1/57]
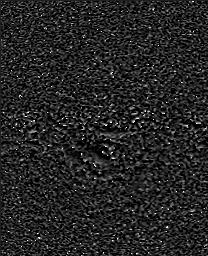
[im 19/57]
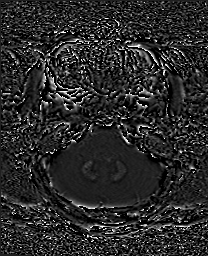
[im 38/57]
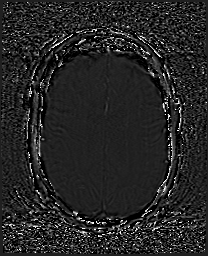
[im 57/57]
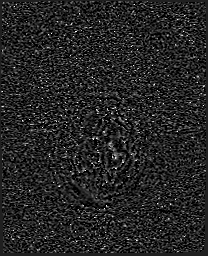

[Series 14: swi_images · axial · 3.0mm · 0.90mm/px · z∈[-127,+48]mm · 5 of 60 slices shown]
[im 1/60]
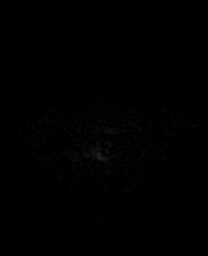
[im 15/60]
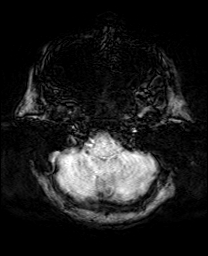
[im 30/60]
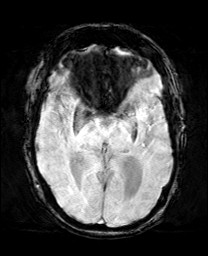
[im 45/60]
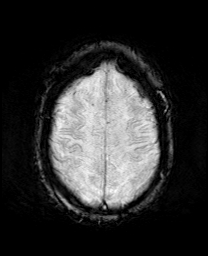
[im 60/60]
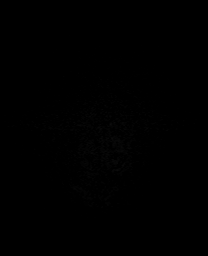

[Series 15: mip_images(sw) · axial · 24.0mm · 0.90mm/px · z∈[-116,+38]mm · 4 of 53 slices shown]
[im 1/53]
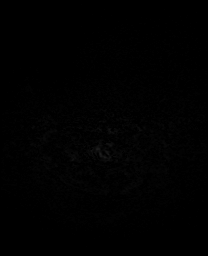
[im 18/53]
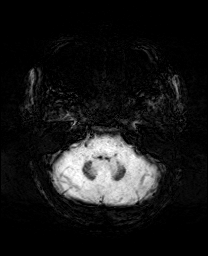
[im 35/53]
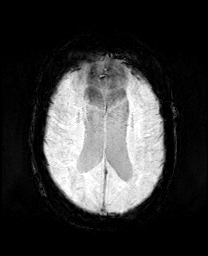
[im 53/53]
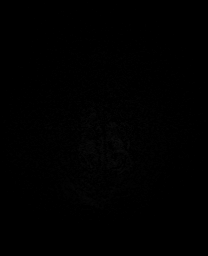

[Series 17: T2 · coronal · 5.0mm · 0.34mm/px · 2 of 29 slices shown (2 of 2)]
[im 1/29]
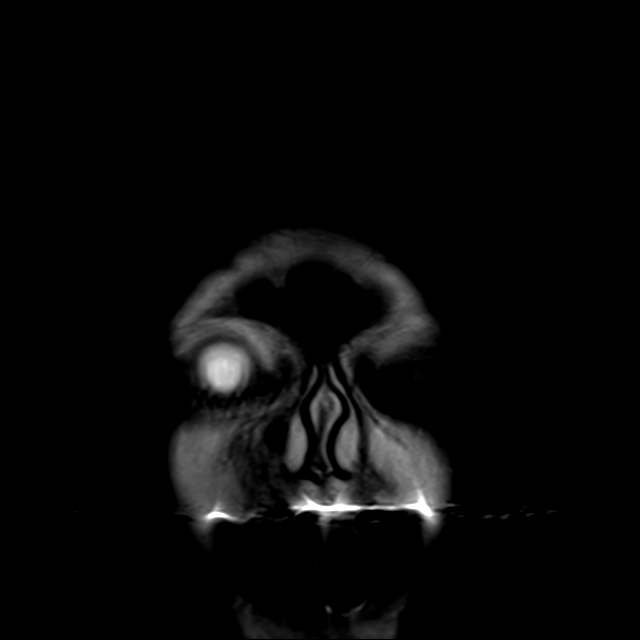
[im 29/29]
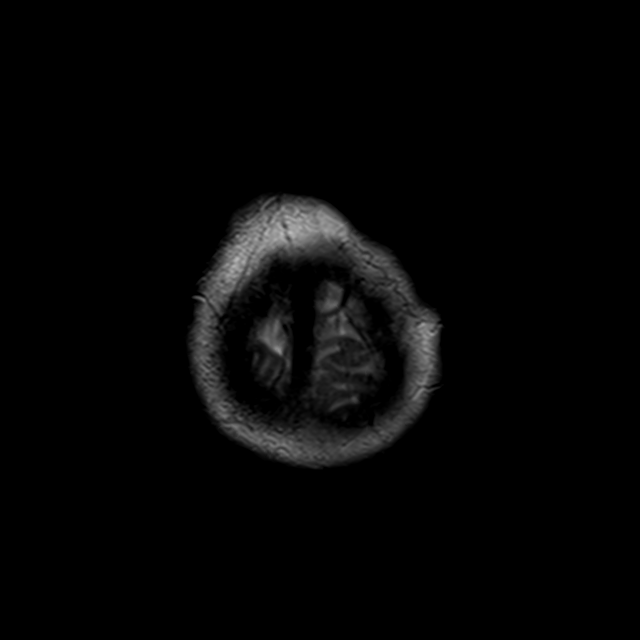

[43 of 48 positions shown; findings below may reference images not displayed]

FINDINGS: Brain: Examination moderately degraded by motion artifact.

Generalized age-related cerebral atrophy. Patchy and confluent
T2/FLAIR hyperintensity within the periventricular and deep white
matter both cerebral hemispheres most consistent with chronic small
vessel ischemic disease, mild in nature. Superimposed small remote
left cerebellar infarct noted.

No abnormal foci of restricted diffusion to suggest acute or
subacute ischemia. Gray-white matter differentiation maintained. No
areas of remote cortical infarction. No evidence for acute
intracranial hemorrhage.

No mass lesion, midline shift or mass effect. No hydrocephalus. No
extra-axial fluid collection. Pituitary gland suprasellar region
normal. Midline structures intact.

Vascular: Major intracranial vascular flow voids are maintained.

Skull and upper cervical spine: Craniocervical junction normal.
Upper cervical spine within normal limits. Bone marrow signal
intensity normal. Small posterior scalp contusion noted.

Sinuses/Orbits: Patient status post bilateral ocular lens
replacement. Paranasal sinuses are clear. No mastoid effusion. Inner
ear structures normal.

Other: None.
IMPRESSION: 1. No acute intracranial abnormality.
2. Small posterior scalp contusion.
3. Age-related cerebral atrophy with mild chronic small vessel
ischemic disease.

## 2019-11-18 DIAGNOSIS — H40003 Preglaucoma, unspecified, bilateral: Secondary | ICD-10-CM | POA: Diagnosis not present

## 2019-11-22 DIAGNOSIS — H40003 Preglaucoma, unspecified, bilateral: Secondary | ICD-10-CM | POA: Diagnosis not present

## 2019-11-22 LAB — HM DIABETES EYE EXAM

## 2020-01-04 DIAGNOSIS — Z23 Encounter for immunization: Secondary | ICD-10-CM | POA: Diagnosis not present

## 2020-02-21 ENCOUNTER — Inpatient Hospital Stay: Payer: Medicare Other | Attending: Oncology

## 2020-02-21 ENCOUNTER — Other Ambulatory Visit: Payer: Self-pay

## 2020-02-21 DIAGNOSIS — D509 Iron deficiency anemia, unspecified: Secondary | ICD-10-CM | POA: Insufficient documentation

## 2020-02-21 LAB — CBC WITH DIFFERENTIAL/PLATELET
Abs Immature Granulocytes: 0.05 10*3/uL (ref 0.00–0.07)
Basophils Absolute: 0 10*3/uL (ref 0.0–0.1)
Basophils Relative: 1 %
Eosinophils Absolute: 0.2 10*3/uL (ref 0.0–0.5)
Eosinophils Relative: 3 %
HCT: 39.6 % (ref 36.0–46.0)
Hemoglobin: 12.8 g/dL (ref 12.0–15.0)
Immature Granulocytes: 1 %
Lymphocytes Relative: 19 %
Lymphs Abs: 1.6 10*3/uL (ref 0.7–4.0)
MCH: 29.2 pg (ref 26.0–34.0)
MCHC: 32.3 g/dL (ref 30.0–36.0)
MCV: 90.2 fL (ref 80.0–100.0)
Monocytes Absolute: 0.5 10*3/uL (ref 0.1–1.0)
Monocytes Relative: 6 %
Neutro Abs: 6.1 10*3/uL (ref 1.7–7.7)
Neutrophils Relative %: 70 %
Platelets: 251 10*3/uL (ref 150–400)
RBC: 4.39 MIL/uL (ref 3.87–5.11)
RDW: 13.1 % (ref 11.5–15.5)
WBC: 8.5 10*3/uL (ref 4.0–10.5)
nRBC: 0 % (ref 0.0–0.2)

## 2020-02-21 LAB — IRON AND TIBC
Iron: 106 ug/dL (ref 28–170)
Saturation Ratios: 27 % (ref 10.4–31.8)
TIBC: 391 ug/dL (ref 250–450)
UIBC: 285 ug/dL

## 2020-02-21 LAB — FERRITIN: Ferritin: 27 ng/mL (ref 11–307)

## 2020-02-24 ENCOUNTER — Inpatient Hospital Stay (HOSPITAL_BASED_OUTPATIENT_CLINIC_OR_DEPARTMENT_OTHER): Payer: Medicare Other | Admitting: Oncology

## 2020-02-24 ENCOUNTER — Encounter: Payer: Self-pay | Admitting: Oncology

## 2020-02-24 DIAGNOSIS — D509 Iron deficiency anemia, unspecified: Secondary | ICD-10-CM

## 2020-02-24 NOTE — Progress Notes (Signed)
Patient reports no improved of her energy level and feels like she needs to sleep all the time.
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
## 2020-04-02 ENCOUNTER — Ambulatory Visit (INDEPENDENT_AMBULATORY_CARE_PROVIDER_SITE_OTHER): Payer: Medicare Other | Admitting: Internal Medicine

## 2020-04-02 ENCOUNTER — Other Ambulatory Visit: Payer: Self-pay

## 2020-04-02 ENCOUNTER — Encounter: Payer: Self-pay | Admitting: Internal Medicine

## 2020-04-02 VITALS — BP 138/70 | HR 67 | Temp 97.8°F | Ht 64.0 in | Wt 176.0 lb

## 2020-04-02 DIAGNOSIS — E039 Hypothyroidism, unspecified: Secondary | ICD-10-CM

## 2020-04-02 DIAGNOSIS — Z0000 Encounter for general adult medical examination without abnormal findings: Secondary | ICD-10-CM

## 2020-04-02 DIAGNOSIS — N1831 Chronic kidney disease, stage 3a: Secondary | ICD-10-CM

## 2020-04-02 DIAGNOSIS — I10 Essential (primary) hypertension: Secondary | ICD-10-CM

## 2020-04-02 DIAGNOSIS — K219 Gastro-esophageal reflux disease without esophagitis: Secondary | ICD-10-CM

## 2020-04-02 DIAGNOSIS — R7303 Prediabetes: Secondary | ICD-10-CM | POA: Diagnosis not present

## 2020-04-02 MED ORDER — ATORVASTATIN CALCIUM 80 MG PO TABS
ORAL_TABLET | ORAL | 3 refills | Status: DC
Start: 2020-04-02 — End: 2021-06-09

## 2020-04-02 MED ORDER — OMEPRAZOLE 40 MG PO CPDR: 40.0000 mg | DELAYED_RELEASE_CAPSULE | Freq: Every day | ORAL | 3 refills | Status: DC

## 2020-04-02 MED ORDER — OLMESARTAN MEDOXOMIL 40 MG PO TABS: 40.0000 mg | ORAL_TABLET | Freq: Every day | ORAL | 3 refills | Status: DC

## 2020-04-02 NOTE — Assessment & Plan Note (Signed)
Seems euthyroid without meds

## 2020-04-02 NOTE — Assessment & Plan Note (Signed)
Okay on PPI daily

## 2020-04-02 NOTE — Patient Instructions (Addendum)
It would be safer to put over the counter diclofenac gel on your knees--rather than taking the aleve. Please set up one last screening mammogram.

## 2020-04-02 NOTE — Assessment & Plan Note (Addendum)
Will check sugars--not strictly fasting today though Consider metformin if worse

## 2020-04-02 NOTE — Progress Notes (Signed)
Hearing Screening   Method: Audiometry   125Hz  250Hz  500Hz  1000Hz  2000Hz  3000Hz  4000Hz  6000Hz  8000Hz   Right ear:   20 20 20  25     Left ear:   20 20 20  25     Vision Screening Comments: July 2021

## 2020-04-02 NOTE — Assessment & Plan Note (Signed)
Continues on ARB Will check labs

## 2020-04-02 NOTE — Assessment & Plan Note (Signed)
BP Readings from Last 3 Encounters:  04/02/20 138/70  08/27/19 (!) 147/74  05/27/19 138/77   Good control on the benicar

## 2020-04-02 NOTE — Assessment & Plan Note (Signed)
I have personally reviewed the Medicare Annual Wellness questionnaire and have noted 1. The patient's medical and social history 2. Their use of alcohol, tobacco or illicit drugs 3. Their current medications and supplements 4. The patient's functional ability including ADL's, fall risks, home safety risks and hearing or visual             impairment. 5. Diet and physical activities 6. Evidence for depression or mood disorders  The patients weight, height, BMI and visual acuity have been recorded in the chart I have made referrals, counseling and provided education to the patient based review of the above and I have provided the pt with a written personalized care plan for preventive services.  I have provided you with a copy of your personalized plan for preventive services. Please take the time to review along with your updated medication list.  Done with colonoscopy--just had one Will get one last mammogram Had COVID booster and flu vaccine Consider shingrix vaccine at pharmacy Discussed exercise---especially quad strengthening
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
## 2020-04-03 LAB — LIPID PANEL
Cholesterol: 182 mg/dL (ref <200)
HDL: 56 mg/dL (ref >=50)
LDL Cholesterol (Calc): 98 mg/dL (calc)
Non-HDL Cholesterol (Calc): 126 mg/dL (calc) (ref <130)
Total CHOL/HDL Ratio: 3.3 (calc) (ref <5.0)
Triglycerides: 179 mg/dL — ABNORMAL HIGH (ref <150)

## 2020-04-03 LAB — CBC
HCT: 38.3 % (ref 35.0–45.0)
Hemoglobin: 12.6 g/dL (ref 11.7–15.5)
MCH: 29 pg (ref 27.0–33.0)
MCHC: 32.9 g/dL (ref 32.0–36.0)
MCV: 88.2 fL (ref 80.0–100.0)
MPV: 11.7 fL (ref 7.5–12.5)
Platelets: 252 10*3/uL (ref 140–400)
RBC: 4.34 10*6/uL (ref 3.80–5.10)
RDW: 12.7 % (ref 11.0–15.0)
WBC: 9.4 10*3/uL (ref 3.8–10.8)

## 2020-04-03 LAB — RENAL FUNCTION PANEL
Albumin: 4.6 g/dL (ref 3.6–5.1)
BUN/Creatinine Ratio: 20 (calc) (ref 6–22)
BUN: 26 mg/dL — ABNORMAL HIGH (ref 7–25)
CO2: 30 mmol/L (ref 20–32)
Calcium: 10 mg/dL (ref 8.6–10.4)
Chloride: 102 mmol/L (ref 98–110)
Creat: 1.27 mg/dL — ABNORMAL HIGH (ref 0.60–0.93)
Glucose, Bld: 84 mg/dL (ref 65–99)
Phosphorus: 4.4 mg/dL — ABNORMAL HIGH (ref 2.1–4.3)
Potassium: 4.9 mmol/L (ref 3.5–5.3)
Sodium: 141 mmol/L (ref 135–146)

## 2020-04-03 LAB — HEPATIC FUNCTION PANEL
AG Ratio: 2 (calc) (ref 1.0–2.5)
ALT: 10 U/L (ref 6–29)
AST: 12 U/L (ref 10–35)
Albumin: 4.6 g/dL (ref 3.6–5.1)
Alkaline phosphatase (APISO): 82 U/L (ref 37–153)
Bilirubin, Direct: 0.1 mg/dL (ref 0.0–0.2)
Globulin: 2.3 g/dL (calc) (ref 1.9–3.7)
Indirect Bilirubin: 0.4 mg/dL (calc) (ref 0.2–1.2)
Total Bilirubin: 0.5 mg/dL (ref 0.2–1.2)
Total Protein: 6.9 g/dL (ref 6.1–8.1)

## 2020-04-03 LAB — T4, FREE: Free T4: 1.1 ng/dL (ref 0.8–1.8)

## 2020-04-03 LAB — HEMOGLOBIN A1C
Hgb A1c MFr Bld: 6 % of total Hgb — ABNORMAL HIGH (ref <5.7)
Mean Plasma Glucose: 126 mg/dL
eAG (mmol/L): 7 mmol/L

## 2020-04-03 LAB — TSH: TSH: 7.42 mIU/L — ABNORMAL HIGH (ref 0.40–4.50)

## 2020-06-26 DIAGNOSIS — S4991XA Unspecified injury of right shoulder and upper arm, initial encounter: Secondary | ICD-10-CM | POA: Diagnosis not present

## 2020-06-26 DIAGNOSIS — M79621 Pain in right upper arm: Secondary | ICD-10-CM | POA: Diagnosis not present

## 2020-06-26 DIAGNOSIS — S79911A Unspecified injury of right hip, initial encounter: Secondary | ICD-10-CM | POA: Diagnosis not present

## 2020-06-26 DIAGNOSIS — M1611 Unilateral primary osteoarthritis, right hip: Secondary | ICD-10-CM | POA: Diagnosis not present

## 2020-06-26 DIAGNOSIS — R1031 Right lower quadrant pain: Secondary | ICD-10-CM | POA: Diagnosis not present

## 2020-06-26 DIAGNOSIS — M19011 Primary osteoarthritis, right shoulder: Secondary | ICD-10-CM | POA: Diagnosis not present

## 2020-06-26 DIAGNOSIS — S32511A Fracture of superior rim of right pubis, initial encounter for closed fracture: Secondary | ICD-10-CM | POA: Diagnosis not present

## 2020-06-26 DIAGNOSIS — S32591A Other specified fracture of right pubis, initial encounter for closed fracture: Secondary | ICD-10-CM | POA: Diagnosis not present

## 2020-06-29 DIAGNOSIS — M1712 Unilateral primary osteoarthritis, left knee: Secondary | ICD-10-CM | POA: Diagnosis not present

## 2020-06-29 DIAGNOSIS — M25551 Pain in right hip: Secondary | ICD-10-CM | POA: Diagnosis not present

## 2020-06-29 DIAGNOSIS — S32511A Fracture of superior rim of right pubis, initial encounter for closed fracture: Secondary | ICD-10-CM | POA: Diagnosis not present

## 2020-06-29 DIAGNOSIS — M25561 Pain in right knee: Secondary | ICD-10-CM | POA: Diagnosis not present

## 2020-07-15 DIAGNOSIS — M25531 Pain in right wrist: Secondary | ICD-10-CM | POA: Diagnosis not present

## 2020-07-15 DIAGNOSIS — M1712 Unilateral primary osteoarthritis, left knee: Secondary | ICD-10-CM | POA: Diagnosis not present

## 2020-07-15 DIAGNOSIS — S62101A Fracture of unspecified carpal bone, right wrist, initial encounter for closed fracture: Secondary | ICD-10-CM | POA: Diagnosis not present

## 2020-07-23 DIAGNOSIS — M1712 Unilateral primary osteoarthritis, left knee: Secondary | ICD-10-CM | POA: Diagnosis not present

## 2020-07-23 DIAGNOSIS — S32511A Fracture of superior rim of right pubis, initial encounter for closed fracture: Secondary | ICD-10-CM | POA: Diagnosis not present

## 2020-07-23 DIAGNOSIS — S62101A Fracture of unspecified carpal bone, right wrist, initial encounter for closed fracture: Secondary | ICD-10-CM | POA: Diagnosis not present

## 2020-08-13 DIAGNOSIS — S32511A Fracture of superior rim of right pubis, initial encounter for closed fracture: Secondary | ICD-10-CM | POA: Diagnosis not present

## 2020-08-21 ENCOUNTER — Inpatient Hospital Stay: Payer: Medicare Other

## 2020-08-24 ENCOUNTER — Inpatient Hospital Stay: Payer: Medicare Other

## 2020-08-24 ENCOUNTER — Inpatient Hospital Stay: Payer: Medicare Other | Admitting: Oncology

## 2020-09-10 DIAGNOSIS — S32511A Fracture of superior rim of right pubis, initial encounter for closed fracture: Secondary | ICD-10-CM | POA: Diagnosis not present

## 2020-09-10 DIAGNOSIS — S62101A Fracture of unspecified carpal bone, right wrist, initial encounter for closed fracture: Secondary | ICD-10-CM | POA: Diagnosis not present

## 2020-09-18 ENCOUNTER — Inpatient Hospital Stay: Payer: Medicare Other

## 2020-09-21 ENCOUNTER — Other Ambulatory Visit: Payer: Self-pay | Admitting: Internal Medicine

## 2020-09-21 DIAGNOSIS — Z1231 Encounter for screening mammogram for malignant neoplasm of breast: Secondary | ICD-10-CM

## 2020-09-22 ENCOUNTER — Inpatient Hospital Stay: Payer: Medicare Other | Admitting: Oncology

## 2020-09-29 ENCOUNTER — Ambulatory Visit
Admission: RE | Admit: 2020-09-29 | Discharge: 2020-09-29 | Disposition: A | Discharge status: Home / Self Care | Payer: Medicare Other | Source: Ambulatory Visit | Attending: Internal Medicine | Admitting: Internal Medicine

## 2020-09-29 ENCOUNTER — Other Ambulatory Visit: Payer: Self-pay

## 2020-09-29 DIAGNOSIS — Z1231 Encounter for screening mammogram for malignant neoplasm of breast: Secondary | ICD-10-CM | POA: Insufficient documentation

## 2020-10-10 DIAGNOSIS — S32511A Fracture of superior rim of right pubis, initial encounter for closed fracture: Secondary | ICD-10-CM | POA: Diagnosis not present

## 2020-10-10 DIAGNOSIS — S62101A Fracture of unspecified carpal bone, right wrist, initial encounter for closed fracture: Secondary | ICD-10-CM | POA: Diagnosis not present

## 2020-10-13 DIAGNOSIS — S62101A Fracture of unspecified carpal bone, right wrist, initial encounter for closed fracture: Secondary | ICD-10-CM | POA: Diagnosis not present

## 2020-10-13 DIAGNOSIS — M654 Radial styloid tenosynovitis [de Quervain]: Secondary | ICD-10-CM | POA: Diagnosis not present

## 2020-10-13 DIAGNOSIS — M1712 Unilateral primary osteoarthritis, left knee: Secondary | ICD-10-CM | POA: Diagnosis not present

## 2020-10-13 DIAGNOSIS — S32511A Fracture of superior rim of right pubis, initial encounter for closed fracture: Secondary | ICD-10-CM | POA: Diagnosis not present

## 2021-01-22 DIAGNOSIS — Z23 Encounter for immunization: Secondary | ICD-10-CM | POA: Diagnosis not present

## 2021-03-04 ENCOUNTER — Other Ambulatory Visit: Payer: Self-pay | Admitting: Internal Medicine

## 2021-03-29 ENCOUNTER — Other Ambulatory Visit: Payer: Self-pay | Admitting: Internal Medicine

## 2021-04-08 ENCOUNTER — Ambulatory Visit (INDEPENDENT_AMBULATORY_CARE_PROVIDER_SITE_OTHER): Payer: Medicare Other | Admitting: Internal Medicine

## 2021-04-08 ENCOUNTER — Encounter: Payer: Self-pay | Admitting: Internal Medicine

## 2021-04-08 ENCOUNTER — Other Ambulatory Visit: Payer: Self-pay

## 2021-04-08 VITALS — BP 130/80 | HR 73 | Temp 97.0°F | Ht 64.5 in | Wt 171.0 lb

## 2021-04-08 DIAGNOSIS — E039 Hypothyroidism, unspecified: Secondary | ICD-10-CM

## 2021-04-08 DIAGNOSIS — M159 Polyosteoarthritis, unspecified: Secondary | ICD-10-CM

## 2021-04-08 DIAGNOSIS — N1831 Chronic kidney disease, stage 3a: Secondary | ICD-10-CM | POA: Diagnosis not present

## 2021-04-08 DIAGNOSIS — E2839 Other primary ovarian failure: Secondary | ICD-10-CM

## 2021-04-08 DIAGNOSIS — I10 Essential (primary) hypertension: Secondary | ICD-10-CM

## 2021-04-08 DIAGNOSIS — M810 Age-related osteoporosis without current pathological fracture: Secondary | ICD-10-CM | POA: Diagnosis not present

## 2021-04-08 DIAGNOSIS — Z0000 Encounter for general adult medical examination without abnormal findings: Secondary | ICD-10-CM | POA: Diagnosis not present

## 2021-04-08 DIAGNOSIS — K219 Gastro-esophageal reflux disease without esophagitis: Secondary | ICD-10-CM

## 2021-04-08 NOTE — Assessment & Plan Note (Signed)
Hasn't needed meds in some years

## 2021-04-08 NOTE — Assessment & Plan Note (Signed)
Likely will get left TKR soon Discussed limiting naproxen---though probably okay if no decline in GFR (since has been taking)

## 2021-04-08 NOTE — Assessment & Plan Note (Signed)
Quiet on omeprazole 
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
## 2021-04-08 NOTE — Progress Notes (Signed)
Hearing Screening - Comments:: Passed whisper test Vision Screening - Comments:: January 2022  

## 2021-04-08 NOTE — Assessment & Plan Note (Signed)
BP Readings from Last 3 Encounters:  04/08/21 130/80  04/02/20 138/70  08/27/19 (!) 147/74   Good control on the olmesartan Will check labs

## 2021-04-08 NOTE — Patient Instructions (Signed)
Please call [facility] to set up your bone density test

## 2021-04-08 NOTE — Assessment & Plan Note (Signed)
Is on ARB If worsens, will stop naproxen

## 2021-04-08 NOTE — Assessment & Plan Note (Signed)
Recent pelvic fracture Will recheck DEXA Bisphosphates if still osteoporotic

## 2021-04-08 NOTE — Assessment & Plan Note (Signed)
I have personally reviewed the Medicare Annual Wellness questionnaire and have noted 1. The patient's medical and social history 2. Their use of alcohol, tobacco or illicit drugs 3. Their current medications and supplements 4. The patient's functional ability including ADL's, fall risks, home safety risks and hearing or visual             impairment. 5. Diet and physical activities 6. Evidence for depression or mood disorders  The patients weight, height, BMI and visual acuity have been recorded in the chart I have made referrals, counseling and provided education to the patient based review of the above and I have provided the pt with a written personalized care plan for preventive services.  I have provided you with a copy of your personalized plan for preventive services. Please take the time to review along with your updated medication list.  Discussed exercise Had bivalent COVID and flu vaccine Td/shingrix when covered Consider one last mammogram Done with colons

## 2021-04-09 LAB — HEPATIC FUNCTION PANEL
ALT: 11 U/L (ref 0–35)
AST: 13 U/L (ref 0–37)
Albumin: 4.4 g/dL (ref 3.5–5.2)
Alkaline Phosphatase: 96 U/L (ref 39–117)
Bilirubin, Direct: 0.1 mg/dL (ref 0.0–0.3)
Total Bilirubin: 0.6 mg/dL (ref 0.2–1.2)
Total Protein: 6.7 g/dL (ref 6.0–8.3)

## 2021-04-09 LAB — RENAL FUNCTION PANEL
Albumin: 4.4 g/dL (ref 3.5–5.2)
BUN: 23 mg/dL (ref 6–23)
CO2: 29 mEq/L (ref 19–32)
Calcium: 9.6 mg/dL (ref 8.4–10.5)
Chloride: 101 mEq/L (ref 96–112)
Creatinine, Ser: 1.39 mg/dL — ABNORMAL HIGH (ref 0.40–1.20)
GFR: 36.21 mL/min — ABNORMAL LOW (ref >60.00)
Glucose, Bld: 87 mg/dL (ref 70–99)
Phosphorus: 3.4 mg/dL (ref 2.3–4.6)
Potassium: 4.3 mEq/L (ref 3.5–5.1)
Sodium: 138 mEq/L (ref 135–145)

## 2021-04-09 LAB — T4, FREE: Free T4: 0.91 ng/dL (ref 0.60–1.60)

## 2021-04-09 LAB — VITAMIN D 25 HYDROXY (VIT D DEFICIENCY, FRACTURES): VITD: 91.94 ng/mL (ref 30.00–100.00)

## 2021-04-09 LAB — TSH: TSH: 6.08 u[IU]/mL — ABNORMAL HIGH (ref 0.35–5.50)

## 2021-04-09 LAB — PARATHYROID HORMONE, INTACT (NO CA): PTH: 44 pg/mL (ref 16–77)

## 2021-04-11 ENCOUNTER — Other Ambulatory Visit: Payer: Self-pay | Admitting: Internal Medicine

## 2021-04-11 DIAGNOSIS — N1832 Chronic kidney disease, stage 3b: Secondary | ICD-10-CM

## 2021-04-12 ENCOUNTER — Encounter: Payer: Self-pay | Admitting: *Deleted

## 2021-05-20 ENCOUNTER — Other Ambulatory Visit: Payer: Self-pay | Admitting: Nephrology

## 2021-05-20 DIAGNOSIS — N1832 Chronic kidney disease, stage 3b: Secondary | ICD-10-CM | POA: Diagnosis not present

## 2021-05-20 DIAGNOSIS — D631 Anemia in chronic kidney disease: Secondary | ICD-10-CM | POA: Insufficient documentation

## 2021-05-20 DIAGNOSIS — E1122 Type 2 diabetes mellitus with diabetic chronic kidney disease: Secondary | ICD-10-CM

## 2021-05-20 DIAGNOSIS — I10 Essential (primary) hypertension: Secondary | ICD-10-CM | POA: Diagnosis not present

## 2021-05-20 DIAGNOSIS — R7303 Prediabetes: Secondary | ICD-10-CM | POA: Diagnosis not present

## 2021-05-20 DIAGNOSIS — R809 Proteinuria, unspecified: Secondary | ICD-10-CM | POA: Insufficient documentation

## 2021-05-20 DIAGNOSIS — R82998 Other abnormal findings in urine: Secondary | ICD-10-CM | POA: Diagnosis not present

## 2021-05-20 DIAGNOSIS — R8290 Unspecified abnormal findings in urine: Secondary | ICD-10-CM

## 2021-05-20 HISTORY — DX: Type 2 diabetes mellitus with diabetic chronic kidney disease: E11.22

## 2021-05-28 ENCOUNTER — Ambulatory Visit
Admission: RE | Admit: 2021-05-28 | Discharge: 2021-05-28 | Disposition: A | Discharge status: Home / Self Care | Payer: Medicare Other | Source: Ambulatory Visit | Attending: Nephrology | Admitting: Nephrology

## 2021-05-28 ENCOUNTER — Other Ambulatory Visit: Payer: Self-pay

## 2021-05-28 DIAGNOSIS — N1832 Chronic kidney disease, stage 3b: Secondary | ICD-10-CM | POA: Diagnosis not present

## 2021-05-28 DIAGNOSIS — R8290 Unspecified abnormal findings in urine: Secondary | ICD-10-CM | POA: Insufficient documentation

## 2021-05-28 DIAGNOSIS — E1122 Type 2 diabetes mellitus with diabetic chronic kidney disease: Secondary | ICD-10-CM | POA: Diagnosis not present

## 2021-06-03 DIAGNOSIS — E1122 Type 2 diabetes mellitus with diabetic chronic kidney disease: Secondary | ICD-10-CM | POA: Diagnosis not present

## 2021-06-03 DIAGNOSIS — M1712 Unilateral primary osteoarthritis, left knee: Secondary | ICD-10-CM | POA: Diagnosis not present

## 2021-06-06 DIAGNOSIS — M1712 Unilateral primary osteoarthritis, left knee: Secondary | ICD-10-CM | POA: Insufficient documentation

## 2021-06-08 ENCOUNTER — Other Ambulatory Visit: Payer: Self-pay | Admitting: Internal Medicine

## 2021-06-09 NOTE — Telephone Encounter (Signed)
Refill request Lipitor ?Last lipid panel 04/02/20 ?Last office visit 04/08/21 ?Last refill 04/02/20 #90/3 ?

## 2021-06-24 DIAGNOSIS — D631 Anemia in chronic kidney disease: Secondary | ICD-10-CM | POA: Diagnosis not present

## 2021-06-24 DIAGNOSIS — E1122 Type 2 diabetes mellitus with diabetic chronic kidney disease: Secondary | ICD-10-CM | POA: Diagnosis not present

## 2021-06-24 DIAGNOSIS — I10 Essential (primary) hypertension: Secondary | ICD-10-CM | POA: Diagnosis not present

## 2021-06-24 DIAGNOSIS — N1832 Chronic kidney disease, stage 3b: Secondary | ICD-10-CM | POA: Diagnosis not present

## 2021-06-24 DIAGNOSIS — R82998 Other abnormal findings in urine: Secondary | ICD-10-CM | POA: Diagnosis not present

## 2021-06-24 DIAGNOSIS — R809 Proteinuria, unspecified: Secondary | ICD-10-CM | POA: Diagnosis not present

## 2021-06-24 DIAGNOSIS — R7303 Prediabetes: Secondary | ICD-10-CM | POA: Diagnosis not present

## 2021-06-27 NOTE — Discharge Instructions (Signed)
Instructions after Total Knee Replacement   Sandra Montgomery, Jr., M.D.     Dept. of Orthopaedics & Sports Medicine  [facility]  [address]  [place], [place]  [address]  Phone: [phone]   Fax: [phone]    DIET: Drink plenty of non-alcoholic fluids. Resume your normal diet. Include foods high in fiber.  ACTIVITY:  You may use crutches or a walker with weight-bearing as tolerated, unless instructed otherwise. You may be weaned off of the walker or crutches by your Physical Therapist.  Do NOT place pillows under the knee. Anything placed under the knee could limit your ability to straighten the knee.   Continue doing gentle exercises. Exercising will reduce the pain and swelling, increase motion, and prevent muscle weakness.   Please continue to use the TED compression stockings for 6 weeks. You may remove the stockings at night, but should reapply them in the morning. Do not drive or operate any equipment until instructed.  WOUND CARE:  Continue to use the PolarCare or ice packs periodically to reduce pain and swelling. You may bathe or shower after the staples are removed at the first office visit following surgery.  MEDICATIONS: You may resume your regular medications. Please take the pain medication as prescribed on the medication. Do not take pain medication on an empty stomach. You have been given a prescription for a blood thinner (Lovenox or Coumadin). Please take the medication as instructed. (NOTE: After completing a 2 week course of Lovenox, take one Enteric-coated aspirin once a day. This along with elevation will help reduce the possibility of phlebitis in your operated leg.) Do not drive or drink alcoholic beverages when taking pain medications.  CALL THE OFFICE FOR: Temperature above 101 degrees Excessive bleeding or drainage on the dressing. Excessive swelling, coldness, or paleness of the toes. Persistent nausea and vomiting.  FOLLOW-UP:  You  should have an appointment to return to the office in 10-14 days after surgery. Arrangements have been made for continuation of Physical Therapy (either home therapy or outpatient therapy).   [facility] Department Directory         [email or web address]       [email or web address]          Cardiology  Appointments: [place] - [phone] [place] - [phone]  Endocrinology  Appointments: [place] - [phone] [place] - [phone]  Gastroenterology  Appointments: [place] - [phone] [place] - [phone]        General Surgery   Appointments: [place] - [phone]  Internal Medicine/Family Medicine  Appointments: [place] - [phone] [place] - [phone] [place] - [phone]  Metabolic and Weigh Loss Surgery  Appointments: [place] - [phone]        Neurology  Appointments: [place] - [phone] [place] - [phone]  Neurosurgery  Appointments: [place] - [phone]  Obstetrics & Gynecology  Appointments: [place] - [phone] [place] - [phone]        Pediatrics  Appointments: [place] - [phone] [place] - [phone]  Physiatry  Appointments: [place] -[phone]  Physical Therapy  Appointments: [place] - [phone] [place] - [phone]        Podiatry  Appointments: [place] - [phone] [place] - [phone]  Pulmonology  Appointments: [place] - [phone]  Rheumatology  Appointments: [place] - [phone]        [place] Location: [facility]  [address] , [place]  [address]  [place] Location: [facility] [address] [place], [place]  [address]  [place] Location: [facility] [address] [place], [place]  [address]    

## 2021-06-28 ENCOUNTER — Other Ambulatory Visit: Payer: Self-pay | Admitting: Internal Medicine

## 2021-07-09 ENCOUNTER — Inpatient Hospital Stay: Admission: RE | Admit: 2021-07-09 | Payer: Medicare Other | Source: Ambulatory Visit

## 2021-07-12 ENCOUNTER — Other Ambulatory Visit: Payer: Self-pay

## 2021-07-12 ENCOUNTER — Encounter
Admission: RE | Admit: 2021-07-12 | Discharge: 2021-07-12 | Disposition: A | Discharge status: Home / Self Care | Payer: Medicare Other | Source: Ambulatory Visit | Attending: Orthopedic Surgery | Admitting: Orthopedic Surgery

## 2021-07-12 VITALS — BP 170/79 | HR 83 | Resp 14 | Ht 64.5 in | Wt 169.0 lb

## 2021-07-12 DIAGNOSIS — R8290 Unspecified abnormal findings in urine: Secondary | ICD-10-CM

## 2021-07-12 DIAGNOSIS — Z91040 Latex allergy status: Secondary | ICD-10-CM | POA: Diagnosis not present

## 2021-07-12 DIAGNOSIS — E119 Type 2 diabetes mellitus without complications: Secondary | ICD-10-CM | POA: Diagnosis not present

## 2021-07-12 DIAGNOSIS — M1712 Unilateral primary osteoarthritis, left knee: Secondary | ICD-10-CM

## 2021-07-12 DIAGNOSIS — N1831 Chronic kidney disease, stage 3a: Secondary | ICD-10-CM

## 2021-07-12 DIAGNOSIS — E785 Hyperlipidemia, unspecified: Secondary | ICD-10-CM | POA: Diagnosis not present

## 2021-07-12 DIAGNOSIS — D509 Iron deficiency anemia, unspecified: Secondary | ICD-10-CM

## 2021-07-12 DIAGNOSIS — Z01818 Encounter for other preprocedural examination: Secondary | ICD-10-CM | POA: Diagnosis not present

## 2021-07-12 DIAGNOSIS — Z01810 Encounter for preprocedural cardiovascular examination: Secondary | ICD-10-CM | POA: Diagnosis not present

## 2021-07-12 HISTORY — DX: Other seasonal allergic rhinitis: J30.2

## 2021-07-12 HISTORY — DX: Cardiac murmur, unspecified: R01.1

## 2021-07-12 LAB — SEDIMENTATION RATE: Sed Rate: 14 mm/hr (ref 0–30)

## 2021-07-12 LAB — URINALYSIS, ROUTINE W REFLEX MICROSCOPIC
Bilirubin Urine: NEGATIVE
Glucose, UA: NEGATIVE mg/dL
Hgb urine dipstick: NEGATIVE
Ketones, ur: NEGATIVE mg/dL
Nitrite: NEGATIVE
Protein, ur: NEGATIVE mg/dL
Specific Gravity, Urine: 1.008 (ref 1.005–1.030)
pH: 7 (ref 5.0–8.0)

## 2021-07-12 LAB — COMPREHENSIVE METABOLIC PANEL
ALT: 15 U/L (ref 0–44)
AST: 16 U/L (ref 15–41)
Albumin: 4.5 g/dL (ref 3.5–5.0)
Alkaline Phosphatase: 104 U/L (ref 38–126)
Anion gap: 8 (ref 5–15)
BUN: 21 mg/dL (ref 8–23)
CO2: 31 mmol/L (ref 22–32)
Calcium: 9.5 mg/dL (ref 8.9–10.3)
Chloride: 100 mmol/L (ref 98–111)
Creatinine, Ser: 1.31 mg/dL — ABNORMAL HIGH (ref 0.44–1.00)
GFR, Estimated: 41 mL/min — ABNORMAL LOW (ref >60)
Glucose, Bld: 108 mg/dL — ABNORMAL HIGH (ref 70–99)
Potassium: 3.3 mmol/L — ABNORMAL LOW (ref 3.5–5.1)
Sodium: 139 mmol/L (ref 135–145)
Total Bilirubin: 0.7 mg/dL (ref 0.3–1.2)
Total Protein: 8.1 g/dL (ref 6.5–8.1)

## 2021-07-12 LAB — CBC
HCT: 44.3 % (ref 36.0–46.0)
Hemoglobin: 13.9 g/dL (ref 12.0–15.0)
MCH: 28.3 pg (ref 26.0–34.0)
MCHC: 31.4 g/dL (ref 30.0–36.0)
MCV: 90 fL (ref 80.0–100.0)
Platelets: 279 10*3/uL (ref 150–400)
RBC: 4.92 MIL/uL (ref 3.87–5.11)
RDW: 14 % (ref 11.5–15.5)
WBC: 8.9 10*3/uL (ref 4.0–10.5)
nRBC: 0 % (ref 0.0–0.2)

## 2021-07-12 LAB — SURGICAL PCR SCREEN
MRSA, PCR: NEGATIVE
Staphylococcus aureus: NEGATIVE

## 2021-07-12 LAB — TYPE AND SCREEN
ABO/RH(D): A POS
Antibody Screen: NEGATIVE

## 2021-07-12 LAB — HEMOGLOBIN A1C
Hgb A1c MFr Bld: 6.2 % — ABNORMAL HIGH (ref 4.8–5.6)
Mean Plasma Glucose: 131.24 mg/dL

## 2021-07-12 LAB — C-REACTIVE PROTEIN: CRP: 0.7 mg/dL (ref <1.0)

## 2021-07-12 NOTE — Patient Instructions (Addendum)
Your procedure is scheduled on: 07/23/21 Report to [facility]. To find out your arrival time please call [phone] between 1PM - 3PM on 07/22/21.  Remember: Instructions that are not followed completely may result in serious medical risk, up to and including death, or upon the discretion of your surgeon and anesthesiologist your surgery may need to be rescheduled.     _X__ 1. Do not eat food after midnight the night before your procedure.                 No gum chewing or hard candies. You may drink clear liquids up to 2 hours                 before you are scheduled to arrive for your surgery- DO not drink clear                 liquids within 2 hours of the start of your surgery.                 Clear Liquids include:  water, apple juice without pulp, clear carbohydrate                 drink such as Clearfast or Gatorade, Black Coffee or Tea (Do not add                 anything to coffee or tea). Diabetics water only  DRINK THE ENSURE PRE SURGERY DRINK 2 HOURS BEFORE ARRIVING TO SURGERY  __X__2.  On the morning of surgery brush your teeth with toothpaste and water, you                 may rinse your mouth with mouthwash if you wish.  Do not swallow any              toothpaste of mouthwash.     _X__ 3.  No Alcohol for 24 hours before or after surgery.   _X__ 4.  Do Not Smoke or use e-cigarettes For 24 Hours Prior to Your Surgery.                 Do not use any chewable tobacco products for at least 6 hours prior to                 surgery.  ____  5.  Bring all medications with you on the day of surgery if instructed.   __X__  6.  Notify your doctor if there is any change in your medical condition      (cold, fever, infections).     Do not wear jewelry, make-up, hairpins, clips or nail polish. Do not wear lotions, powders, or perfumes.  Do not shave body hair 48 hours prior to surgery. Men may shave face and neck. Do not bring  valuables to the hospital.    [facility] is not responsible for any belongings or valuables.  Contacts, dentures/partials or body piercings may not be worn into surgery. Bring a case for your contacts, glasses or hearing aids, a denture cup will be supplied. Leave your suitcase in the car. After surgery it may be brought to your room. For patients admitted to the hospital, discharge time is determined by your treatment team.   Patients discharged the day of surgery will not be allowed to drive home.   Please read over the following fact sheets that you were given:   MRSA Information, CHG  soap, Ensure, Incentive Spirometer  __X__ Take these medicines the morning of surgery with A SIP OF WATER:    1. omeprazole (PRILOSEC) 40 MG capsule  2.   3.   4.  5.  6.  ____ Fleet Enema (as directed)   __X__ Use CHG Soap/SAGE wipes as directed  ____ Use inhalers on the day of surgery  ____ Stop metformin/Janumet/Farxiga 2 days prior to surgery    ____ Take 1/2 of usual insulin dose the night before surgery. No insulin the morning          of surgery.   ____ Stop Blood Thinners Coumadin/Plavix/Xarelto/Pleta/Pradaxa/Eliquis/Effient/Aspirin  on   Or contact your Surgeon, Cardiologist or Medical Doctor regarding  ability to stop your blood thinners  __X__ Stop Anti-inflammatories 7 days before surgery such as Advil, Ibuprofen, Motrin,  BC or Goodies Powder, Naprosyn, Naproxen, Aleve, Aspirin   My take Tylenol if needed  __X__ Stop all herbals and supplements, fish oil or vitamins for 7 days until after surgery.    ____ Bring C-Pap to the hospital.

## 2021-07-13 DIAGNOSIS — M1712 Unilateral primary osteoarthritis, left knee: Secondary | ICD-10-CM | POA: Diagnosis not present

## 2021-07-14 LAB — LATEX, IGE: Latex: <0.1 kU/L

## 2021-07-15 LAB — URINE CULTURE: Culture: >=100000 — AB

## 2021-07-15 NOTE — Pre-Procedure Instructions (Signed)
Progress Notes ?- documented in this encounter ?Lyla Son, MD - 06/24/2021 2:40 PM EDT ?Formatting of this note is different from the original. ?Images from the original note were not included. ?Follow Up Visit  ? ?Patient Name: Sandra Montgomery, female  ?Patient DOB: 03-11-1941 Date of Service: 06/24/2021  ?Patient MRN: [id number] Provider Creating Note: Lyla Son, MD  ?[phone] Primary Care Physician:  ?[id number] ?[place] [phone] Additional Physicians/ Providers:  ? ?History of Present Illness ?Sandra Montgomery is a 81 y.o. female now comes to the office for follow-up. ? ?81 y.o. female with past medical history of hypertension, coronary artery disease, severe osteoarthritis, gastroesophageal reflux disease, hypothyroidism now comes for renal follow-up.  ?She is scheduled for left knee surgery next month. ?She stopped taking the naproxen at this time. ?Denies any chest pain, SOB or DOE.  ?Denies any history of fever, chills or head aches. ? ?She has been on angiotensin receptor blocker and has been tolerating well. ? ?Medications  ? ?Current Outpatient Medications:  ? acetaminophen (TYLENOL) 500 MG tablet, Take by mouth every 6 (six) hours if needed for mild pain, Disp: , Rfl:  ? albuterol HFA (PROVENTIL HFA;VENTOLIN HFA) 108 (90 Base) MCG/ACT inhaler, Ventolin HFA 90 mcg/actuation aerosol inhaler USE 1-2 PUFF(S) INHALATION GIVEN EVERY 4 TO 6 HOURS AS NEEDED., Disp: , Rfl:  ? atorvastatin (LIPITOR) 80 MG tablet, Take 80 mg by mouth 1 (one) time each day, Disp: , Rfl:  ? Biotin 10 MG tablet, Take 1 tablet by mouth twice a day, Disp: , Rfl:  ? Calcium Citrate-Vitamin D (CALCIUM + D PO), Take 650 mg by mouth 2 (two) times a day, Disp: , Rfl:  ? Cholecalciferol (Vitamin D3) 1.25 MG (50000 UT) tablet, Take by mouth 1 (one) time each day, Disp: , Rfl:  ? Ferrous Sulfate 28 MG tablet, Take 28 mg by mouth 1 (one) time each day with breakfast, Disp: , Rfl:  ? fluticasone (FLONASE) 50  MCG/ACT nasal spray, fluticasone propionate 50 mcg/actuation nasal spray,suspension, Disp: , Rfl:  ? olmesartan (BENICAR) 40 MG tablet, Take 40 mg by mouth 1 (one) time each day, Disp: , Rfl:  ? omeprazole (PriLOSEC) 40 MG DR capsule, Take 40 mg by mouth 1 (one) time each day, Disp: , Rfl:  ? ?Allergies ?Short ragweed pollen ext, Adhesive tape, and Latex ? ?Problem List ?Patient Active Problem List  ?Diagnosis  ? Prediabetes  ? Anemia in chronic kidney disease  ? Proteinuria, not otherwise specified  ? Hypertension  ? Type 2 diabetes mellitus with diabetic chronic kidney disease ([place])  ? Stage 3b chronic kidney disease ([place])  ? Other abnormal finding in urine  ? ?Review of Systems ? ?Constitutional: Negative.  ?HENT: Negative.  ?Eyes: Negative.  ?Respiratory: Negative.  ?Cardiovascular: Negative.  ?Gastrointestinal: Negative.  ?Genitourinary: Negative.  ?Musculoskeletal: Positive for joint pain.  ?Skin: Negative.  ? ?History ?Past Medical History:  ?Diagnosis Date  ? Cancer [facility])  ?skin shoulder carcinoma  ? Gastroesophageal reflux disease  ? Hyperlipidemia  ? Hypertension  ? Hypothyroidism  ? Osteoarthrosis involving knee  ? Osteoporosis  ? ?Past Surgical History:  ?Procedure Laterality Date  ? BLADDER SURGERY  ?had it tacked many years ago  ? BREAST BIOPSY Left  ? BREAST SURGERY 04/05/1983  ? CATARACT EXTRACTION BILATERAL W/ ANTERIOR VITRECTOMY Left 04/26/2017  ?05/24/2017  ? COLONOSCOPY 07/18/2016  ?04/18/2019  ? ESOPHAGOGASTRODUODENOSCOPY 04/18/2019  ? TONSILLECTOMY 04/05/1959  ? TOTAL ABDOMINAL HYSTERECTOMY 04/05/1979  ? ?Family History  ?  Problem Relation Age of Onset  ? Heart disease Father  ? ?Social History  ? ?Tobacco Use  ? Smoking status: Never  ? Smokeless tobacco: Never  ?Substance Use Topics  ? Alcohol use: Not Currently  ? ?Physical Exam  ?Vitals ?BP 126/75 (BP Location: Left upper arm, Patient Position: Sitting)  Pulse 75  Temp 98 ?F  Wt 171 lb 3.2 oz (77.7 kg)  SpO2 95%  BMI 28.93 kg/m?   ? ?PHYSICAL EXAM: ?General appearance: well developed, well nourished, NAD ?Neck: Trachea midline; supple ?Lungs: CTAB, with normal respiratory effort  ?CV: S1S2, no murmurs or rubs. ?Abdomen: Soft, non-tender; bowel sounds present ?Extremities: No peripheral edema ? ?Laboratory Studies  ? ?Chemistry  ?Lab Units 05/20/21 ?1552 05/20/21 ?1509  ?SODIUM mmol/L 138 --  ?POTASSIUM mmol/L 4.7 --  ?CHLORIDE mmol/L 102 --  ?CO2 mmol/L 29 --  ?CALCIUM mg/dL 9.5 --  ?PHOSPHORUS mg/dL 3.0 --  ?PTH pg/mL 65 --  ?GLUCOSE mg/dL 99 --  ?ALBUMIN, S/P g/dL 4.5 --  ?ALBUMIN -- 30  ?BUN mg/dL 20 --  ?CREATININE mg/dL 1.44* --  ? ?CBC  ?Lab Units 05/20/21 ?1552  ?WBC AUTO Thousand/uL 9.6  ?HEMOGLOBIN g/dL 14.0  ?HEMATOCRIT % 43.2  ?MCV fL 88.5  ?PLATELETS AUTO Thousand/uL 277  ? ?Urine  ?Lab Units 05/20/21 ?1552 05/20/21 ?1509  ?COLOR UA -- Yellow  ?CLARITY UA -- Clear  ?KETONES UA -- Negative  ?PH UA -- 7.0  ?UROBILINOGEN UA -- 0.2  ?PROT/CREAT RATIO UR mg/g creat 0.254*  254* --  ? ? ? ?Imaging and Other Studies ? ?RENAL / URINARY TRACT ULTRASOUND COMPLETE  ? ?COMPARISON: None.  ? ?FINDINGS:  ?Right Kidney:  ? ?Renal measurements: 9.3 x 4.3 x 3.9 cm = volume: 80 mL. Echogenicity  ?within normal limits. No mass or hydronephrosis visualized.  ? ?Left Kidney:  ? ?Renal measurements: 9.2 x 4.2 x 4.5 cm = volume: 91 mL. Echogenicity  ?within normal limits. No mass or hydronephrosis visualized.  ? ?Bladder:  ? ?Appears normal for degree of bladder distention. Bilateral ureteral  ?jets were identified.  ? ?Other:  ? ?None.  ? ?IMPRESSION:  ?Normal ultrasound of the bilateral kidneys.  ? ?Electronically Signed  ?By: Yvonne Kendall M.D.  ?On: 05/31/2021 18:11 ? ?Impression/Recommendations  ? ?Sandra Montgomery is a 81 y.o. female with past medical history of hypertension, coronary artery disease, severe osteoarthritis, gastroesophageal reflux disease, hypothyroidism now comes for renal follow-up.  ? ?#1: Chronic kidney disease: CKD stage IIIa  most likely secondary to chronic hypertensive nephrosclerosis complicated by nonsteroidal anti-inflammatory drug use.  ? ?#2: Hypertension: She has been on Benicar at 40 mg daily. Blood pressure is improved after stopping the nonsteroidal antiinflammatory drugs. I advised her to stay on a 2 g salt restricted diet. ? ?#3: Anemia: Patient may have anemia of chronic disease secondary to iron deficiency. ? ?#4: Secondary hyperparathyroidism: PTH, calcium and phosphorus levels are within normal limits. There is no need for calcitriol at this time.  ? ?#5: Osteoarthritis: Patient is scheduled for left knee surgery. She is cleared from renal standpoint for the surgery.  ? ?#6: Proteinuria: Patient has subnephrotic proteinuria which may be secondary to NSAIDs usage. We will continue to monitor. We will continue the ARB for long-term cardiorenal protection. ? ?I spoke to the patient in detail and advised her on the diet. ?I have answered all her questions to her satisfaction. ?I advised her to avoid nonsteroidal anti-inflammatory drugs. ? ?Return in about 6 months (around  12/25/2021). ? ?Dear Dr. Silvio Pate, thank you for the opportunity to participate in the care of this very pleasant patient.  ? ?I discussed the assessment and treatment plan with the patient.  ?The patient was provided an opportunity to ask questions and all were answered.  ?The patient agreed with the plan and demonstrated an understanding of the instructions. ? ?The patient was advised to call back or seek an in-person evaluation if the symptoms worsen or if the condition fails to improve as anticipated. ? ?Thank you for letting me participate in the care of your pleasant patient. If you do have any questions or concerns regarding the patient care please do not hesitate to reach me in my office. ? ?Lyla Son, MD ?[facility] ?Ph: [phone] Fax: [phone] ?06/24/2021  ?Electronically signed by Lyla Son, MD at 06/24/2021  3:06 PM EDT  ?Plan of Treatment ?- documented as of this encounter ?Plan of Treatment - Upcoming Encounters ?Upcoming Encounters ?Date Type Specialty Care Team Description  ?12/29/2021 Office Visit Nephrology [place],

## 2021-07-18 NOTE — H&P (Signed)
ORTHOPAEDIC HISTORY & PHYSICAL ?[name], [place] - 07/13/2021 10:45 AM EDT ?Formatting of this note is different from the original. ?[place] ?[facility]THOPAEDICS [facility] SP[facility]TS [facility]ICINE ?Chief Complaint:  ? ?Chief Complaint  ?Patient presents with  ? Knee Pain  ?H & P LEFT KNEE  ? ?History of Present Illness:  ? ?Sandra Montgomery is a 81 y.o. female that presents to clinic today for her preoperative history and evaluation. Patient presents unaccompanied. The patient is scheduled to undergo a left total knee arthroplasty on 07/23/21 by Dr. Marry Guan. Her pain began several years ago. The pain is located primarily along the medial aspect of the knee. She describes her pain as worse with weightbearing. She reports associated swelling with some giving way of the knee. She denies associated numbness or tingling, denies locking.  ? ?The patient's symptoms have progressed to the point that they decrease her quality of life. The patient has previously undergone conservative treatment including NSAIDS and injections to the knee without adequate control of her symptoms. ? ?Patient denies history of lumbar surgery, denies history of DVT, denies significant cardiac history. She lives with her daughter and son-in-law, but they do work during the day. States she does have a friend she could stay with in [facility] if necessary.  ? ?Patient states she fell this past Friday and landed on the knee. Denies any increase in her knee pain since that time, has been able to weight-bear without issue.  ? ?Past Medical, Surgical, Family, Social History, Allergies, Medications:  ? ?Past Medical History:  ?Past Medical History:  ?Diagnosis Date  ? Diabetes mellitus type II, controlled (CMS-HCC)  ? High blood pressure  ? High cholesterol  ? History of blood transfusion 1986  ?Following head laceration sustained during fall  ? History of shingles  ? Migraines  ? Thyroid disorder  ? ?Past Surgical History:  ?Past Surgical History:   ?Procedure Laterality Date  ? COLONOSCOPY 04/07/2004  ?Adenomatous Polyps  ? EGD 06/28/2010  ?No repeat per RTE  ? COLONOSCOPY 06/28/2010  ?[place]ells Adenomatous Polyps: CBF 06/2015; Recall Ltr mailed 05/01/2015 (dw)  ? COLONOSCOPY 07/18/2016  ?PH Adenomatous Polyps: CBF 07/2021  ? COLONOSCOPY 04/18/2019  ?Diverticulosis/Internal hemorroids/Otherwise normal - No repeat recommended per TKT  ? EGD 04/18/2019  ?Normal/Hiatal hernia - No repeat recommended per TKT.  ? CARPAL TUNNEL SURGERY  ? HYSTERECTOMY VAGINAL  ? MASTECTOMY PARTIAL / LUMPECTOMY  ? TONSILLECTOMY  ? ?Current Medications:  ?Current Outpatient Medications  ?Medication Sig Dispense Refill  ? acetaminophen (TYLENOL) 500 MG tablet Take 1,000 mg by mouth 2 (two) times daily as needed for Pain  ? atorvastatin (LIPITOR) 80 MG tablet Take 80 mg by mouth once daily.  ? biotin 1,000 mcg Chew Take 2,000 mcg by mouth once daily  ? calcium carb/vitamin D3/vit K1 (SOFT CHEWS CALCIUM ORAL) Take 1 tablet by mouth 2 (two) times daily  ? cholecalciferol (VITAMIN D3) 5,000 unit capsule Take 1 capsule by mouth once daily  ? ferrous sulfate (IRON ORAL) Take 28 mg by mouth once daily  ? fluticasone propionate (FLONASE) 50 mcg/actuation nasal spray Place 2 sprays into both nostrils once daily as needed for Rhinitis  ? magnesium oxide,aspartate,citr (TRIPLE MAGNESIUM COMPLEX ORAL) Take 1 capsule by mouth once daily  ? methenamine-sodium salicylate 564-332.9 mg Tab Take 2 tablets by mouth once as needed  ? multivit-min-iron-FA-lutein 8 mg iron-400 mcg-300 mcg Tab Take 1 tablet by mouth once daily  ? olmesartan (BENICAR) 40 MG tablet Take 1  tablet by mouth once daily  ? omeprazole (PRILOSEC) 40 MG DR capsule Take 40 mg by mouth once daily.  ? biotin 1 mg tablet Take 1,000 mcg by mouth once daily  ? naproxen sodium (ALEVE) 220 MG tablet Take 440 mg by mouth every morning (Patient not taking: Reported on 07/13/2021)  ? ?No current facility-administered medications for this visit.   ? ?Allergies:  ?Allergies  ?Allergen Reactions  ? Adhesive Other (See Comments)  ? Ragweed Pollen Other (See Comments)  ? Adhesive Tape-Silicones Rash  ?And peels the skin  ? Latex Rash  ? ?Social History:  ?Social History  ? ?Socioeconomic History  ? Marital status: Widowed  ? Number of children: 2  ? Years of education: 13+  ?Occupational History  ? Occupation: Music therapist  ?Comment: [employer]  ?Tobacco Use  ? Smoking status: Never  ? Smokeless tobacco: Never  ?Vaping Use  ? Vaping Use: Never used  ?Substance and Sexual Activity  ? Alcohol use: No  ? Drug use: No  ? Sexual activity: Defer  ? ?Family History:  ?Family History  ?Problem Relation Age of Onset  ? Myocardial Infarction (Heart attack) Mother  ? Alzheimer's disease Father  ? COPD Sister  ? ?Review of Systems:  ? ?A 10+ ROS was performed, reviewed, and the pertinent orthopaedic findings are documented in the HPI.  ? ?Physical Examination:  ? ?BP (!) 150/80 (BP Location: Left upper arm, Patient Position: Sitting, BP Cuff Size: Adult)  Ht 162.6 cm ('5\' 4"'$ )  Wt 75.6 kg (166 lb 9.6 oz)  BMI 28.60 kg/m?  ? ?Patient is a well-developed, well-nourished female in no acute distress. Patient has normal mood and affect. Patient is alert and oriented to person, place, and time.  ? ?HEENT: Atraumatic, normocephalic. Pupils equal and reactive to light. Extraocular motion intact. Noninjected sclera. ? ?Cardiovascular: Regular rate and rhythm, with no murmurs, rubs, or gallops. Distal pulses palpable. No carotid bruits.  ? ?Respiratory: Lungs clear to auscultation bilaterally.  ? ?Left Knee: ?Soft tissue swelling: minimal ?Effusion: none ?Erythema: none ?Crepitance: mild ?Tenderness: medial ?Alignment: relative varus ?Mediolateral laxity: medial pseudolaxity ?Posterior sag: negative ?Patellar tracking: Good tracking without evidence of subluxation or tilt ?Atrophy: No significant atrophy.  ?Quadriceps tone was good. ?Range of  motion: 0/3/140 degrees  ? ?Patient able to plantarflex and dorsiflex the left ankle. Able to flex and extend the toes. ? ?Sensation intact over the saphenous, lateral sural cutaneous, superficial fibular, and deep fibular nerve distributions. ? ?Tests Performed/Reviewed:  ?X-rays ? ?3 views of the left knee were reviewed. Images reveal severe loss of medial compartment joint space with bone-on-bone contact and osteophyte formation. No fractures or dislocations. No other osseous abnormality noted ? ?Impression:  ? ?ICD-10-CM  ?1. Primary osteoarthritis of left knee M17.12  ? ?Plan:  ? ?The patient has end-stage degenerative changes of the left knee. It was explained to the patient that the condition is progressive in nature. Having failed conservative treatment, the patient has elected to proceed with a total joint arthroplasty. The patient will undergo a total joint arthroplasty with Dr. Marry Guan. The risks of surgery, including blood clot and infection, were discussed with the patient. Measures to reduce these risks, including the use of anticoagulation, perioperative antibiotics, and early ambulation were discussed. The importance of postoperative physical therapy was discussed with the patient. The patient elects to proceed with surgery. The patient is instructed to stop all blood thinners prior to surgery. The patient is instructed  to call the hospital the day before surgery to learn of the proper arrival time.  ? ?Contact our office with any questions or concerns. Follow up as indicated, or sooner should any new problems arise, if conditions worsen, or if they are otherwise concerned.  ? ?Gwenlyn Fudge, PA -C ?[facility] ?[address] ?[place], [place] [address] ?Phone: [phone] ? ?This note was generated in part with voice recognition software and I apologize for any typographical errors that were not detected and corrected. ? ?Electronically signed by Gwenlyn Fudge, PA at 07/13/2021 12:51 PM EDT ? ?

## 2021-07-23 ENCOUNTER — Other Ambulatory Visit: Payer: Self-pay

## 2021-07-23 ENCOUNTER — Observation Stay
Admission: RE | Admit: 2021-07-23 | Discharge: 2021-07-24 | Disposition: A | Discharge status: Home Health | Payer: Medicare Other | Attending: Orthopedic Surgery | Admitting: Orthopedic Surgery

## 2021-07-23 ENCOUNTER — Encounter: Payer: Self-pay | Admitting: Orthopedic Surgery

## 2021-07-23 ENCOUNTER — Ambulatory Visit: Payer: Medicare Other | Admitting: Urgent Care

## 2021-07-23 ENCOUNTER — Observation Stay: Payer: Medicare Other

## 2021-07-23 ENCOUNTER — Encounter
Admission: RE | Disposition: A | Discharge status: Home Health | Payer: Self-pay | Source: Home / Self Care | Attending: Orthopedic Surgery

## 2021-07-23 DIAGNOSIS — M1712 Unilateral primary osteoarthritis, left knee: Secondary | ICD-10-CM | POA: Diagnosis not present

## 2021-07-23 DIAGNOSIS — D509 Iron deficiency anemia, unspecified: Secondary | ICD-10-CM

## 2021-07-23 DIAGNOSIS — Z471 Aftercare following joint replacement surgery: Secondary | ICD-10-CM | POA: Diagnosis not present

## 2021-07-23 DIAGNOSIS — E119 Type 2 diabetes mellitus without complications: Secondary | ICD-10-CM | POA: Diagnosis not present

## 2021-07-23 DIAGNOSIS — Z79899 Other long term (current) drug therapy: Secondary | ICD-10-CM | POA: Diagnosis not present

## 2021-07-23 DIAGNOSIS — Z91040 Latex allergy status: Secondary | ICD-10-CM | POA: Insufficient documentation

## 2021-07-23 DIAGNOSIS — M25462 Effusion, left knee: Secondary | ICD-10-CM | POA: Diagnosis not present

## 2021-07-23 DIAGNOSIS — K219 Gastro-esophageal reflux disease without esophagitis: Secondary | ICD-10-CM | POA: Diagnosis not present

## 2021-07-23 DIAGNOSIS — N1831 Chronic kidney disease, stage 3a: Secondary | ICD-10-CM

## 2021-07-23 DIAGNOSIS — E785 Hyperlipidemia, unspecified: Secondary | ICD-10-CM

## 2021-07-23 DIAGNOSIS — Z96659 Presence of unspecified artificial knee joint: Secondary | ICD-10-CM

## 2021-07-23 DIAGNOSIS — Z96652 Presence of left artificial knee joint: Secondary | ICD-10-CM | POA: Diagnosis not present

## 2021-07-23 HISTORY — PX: KNEE ARTHROPLASTY: SHX992

## 2021-07-23 LAB — POCT I-STAT, CHEM 8
BUN: 24 mg/dL — ABNORMAL HIGH (ref 8–23)
Calcium, Ion: 1.17 mmol/L (ref 1.15–1.40)
Chloride: 105 mmol/L (ref 98–111)
Creatinine, Ser: 1.7 mg/dL — ABNORMAL HIGH (ref 0.44–1.00)
Glucose, Bld: 118 mg/dL — ABNORMAL HIGH (ref 70–99)
HCT: 37 % (ref 36.0–46.0)
Hemoglobin: 12.6 g/dL (ref 12.0–15.0)
Potassium: 4.2 mmol/L (ref 3.5–5.1)
Sodium: 137 mmol/L (ref 135–145)
TCO2: 23 mmol/L (ref 22–32)

## 2021-07-23 LAB — GLUCOSE, CAPILLARY
Glucose-Capillary: 143 mg/dL — ABNORMAL HIGH (ref 70–99)
Glucose-Capillary: 173 mg/dL — ABNORMAL HIGH (ref 70–99)

## 2021-07-23 SURGERY — ARTHROPLASTY, KNEE, TOTAL, USING IMAGELESS COMPUTER-ASSISTED NAVIGATION
Anesthesia: Spinal | Site: Knee | Laterality: Left

## 2021-07-23 MED ORDER — SENNOSIDES-DOCUSATE SODIUM 8.6-50 MG PO TABS
1.0000 | ORAL_TABLET | Freq: Two times a day (BID) | ORAL | Status: DC
  Administered 2021-07-23 – 2021-07-24 (×2): 1 via ORAL
  Filled 2021-07-23 (×2): qty 1

## 2021-07-23 MED ORDER — CEFAZOLIN SODIUM-DEXTROSE 2-4 GM/100ML-% IV SOLN
INTRAVENOUS | Status: AC
Start: 2021-07-23 — End: 2021-07-23
  Filled 2021-07-23: qty 100

## 2021-07-23 MED ORDER — INSULIN ASPART 100 UNIT/ML IJ SOLN: 0.0000 [IU] | Freq: Three times a day (TID) | INTRAMUSCULAR | Status: DC

## 2021-07-23 MED ORDER — BISACODYL 10 MG RE SUPP: 10.0000 mg | Freq: Every day | RECTAL | Status: DC | PRN

## 2021-07-23 MED ORDER — FLEET ENEMA 7-19 GM/118ML RE ENEM: 1.0000 | ENEMA | Freq: Once | RECTAL | Status: DC | PRN

## 2021-07-23 MED ORDER — OXYCODONE HCL 5 MG PO TABS
10.0000 mg | ORAL_TABLET | ORAL | Status: DC | PRN
  Administered 2021-07-23: 10 mg via ORAL
  Filled 2021-07-23: qty 2

## 2021-07-23 MED ORDER — SURGIPHOR WOUND IRRIGATION SYSTEM - OPTIME
TOPICAL | Status: DC | PRN
  Administered 2021-07-23: 1

## 2021-07-23 MED ORDER — MAGNESIUM HYDROXIDE 400 MG/5ML PO SUSP
30.0000 mL | Freq: Every day | ORAL | Status: DC
  Administered 2021-07-23 – 2021-07-24 (×2): 30 mL via ORAL
  Filled 2021-07-23 (×2): qty 30

## 2021-07-23 MED ORDER — PROMETHAZINE HCL 25 MG/ML IJ SOLN: 6.2500 mg | INTRAMUSCULAR | Status: DC | PRN

## 2021-07-23 MED ORDER — EPHEDRINE SULFATE (PRESSORS) 50 MG/ML IJ SOLN
INTRAMUSCULAR | Status: DC | PRN
  Administered 2021-07-23: 7.5 mg via INTRAVENOUS

## 2021-07-23 MED ORDER — FLUTICASONE PROPIONATE 50 MCG/ACT NA SUSP: 2.0000 | Freq: Every day | NASAL | Status: DC | PRN

## 2021-07-23 MED ORDER — INSULIN ASPART 100 UNIT/ML IJ SOLN: 0.0000 [IU] | Freq: Every day | INTRAMUSCULAR | Status: DC

## 2021-07-23 MED ORDER — SODIUM CHLORIDE 0.9 % IR SOLN
Status: DC | PRN
  Administered 2021-07-23: 3000 mL

## 2021-07-23 MED ORDER — ENSURE PRE-SURGERY PO LIQD
296.0000 mL | Freq: Once | ORAL | Status: AC
  Administered 2021-07-23: 296 mL via ORAL
  Filled 2021-07-23: qty 296

## 2021-07-23 MED ORDER — PANTOPRAZOLE SODIUM 40 MG PO TBEC
40.0000 mg | DELAYED_RELEASE_TABLET | Freq: Two times a day (BID) | ORAL | Status: DC
  Administered 2021-07-23 – 2021-07-24 (×2): 40 mg via ORAL
  Filled 2021-07-23 (×2): qty 1

## 2021-07-23 MED ORDER — CEFAZOLIN SODIUM-DEXTROSE 2-4 GM/100ML-% IV SOLN
2.0000 g | Freq: Four times a day (QID) | INTRAVENOUS | Status: AC
  Administered 2021-07-23 – 2021-07-24 (×2): 2 g via INTRAVENOUS
  Filled 2021-07-23 (×2): qty 100

## 2021-07-23 MED ORDER — MAGNESIUM OXIDE -MG SUPPLEMENT 400 (240 MG) MG PO TABS
400.0000 mg | ORAL_TABLET | Freq: Every day | ORAL | Status: DC
Start: 2021-07-24 — End: 2021-07-24
  Administered 2021-07-24: 400 mg via ORAL
  Filled 2021-07-23: qty 1

## 2021-07-23 MED ORDER — ADULT MULTIVITAMIN W/MINERALS CH
1.0000 | ORAL_TABLET | Freq: Every day | ORAL | Status: DC
  Administered 2021-07-24: 1 via ORAL
  Filled 2021-07-23: qty 1

## 2021-07-23 MED ORDER — CELECOXIB 200 MG PO CAPS
ORAL_CAPSULE | ORAL | Status: AC
  Administered 2021-07-23: 400 mg via ORAL
  Filled 2021-07-23: qty 2

## 2021-07-23 MED ORDER — ACETAMINOPHEN 10 MG/ML IV SOLN: 1000.0000 mg | Freq: Once | INTRAVENOUS | Status: DC | PRN

## 2021-07-23 MED ORDER — PROPOFOL 1000 MG/100ML IV EMUL
INTRAVENOUS | Status: AC
  Filled 2021-07-23: qty 100

## 2021-07-23 MED ORDER — CELECOXIB 200 MG PO CAPS
200.0000 mg | ORAL_CAPSULE | Freq: Two times a day (BID) | ORAL | Status: DC
  Administered 2021-07-23 – 2021-07-24 (×2): 200 mg via ORAL
  Filled 2021-07-23 (×2): qty 1

## 2021-07-23 MED ORDER — PHENYLEPHRINE 80 MCG/ML (10ML) SYRINGE FOR IV PUSH (FOR BLOOD PRESSURE SUPPORT)
PREFILLED_SYRINGE | INTRAVENOUS | Status: AC
Start: 2021-07-23 — End: ?
  Filled 2021-07-23: qty 10

## 2021-07-23 MED ORDER — TRANEXAMIC ACID-NACL 1000-0.7 MG/100ML-% IV SOLN
INTRAVENOUS | Status: AC
  Filled 2021-07-23: qty 100

## 2021-07-23 MED ORDER — LACTATED RINGERS IV SOLN: INTRAVENOUS | Status: DC

## 2021-07-23 MED ORDER — BUPIVACAINE HCL (PF) 0.5 % IJ SOLN
INTRAMUSCULAR | Status: DC | PRN
  Administered 2021-07-23: 3 mL

## 2021-07-23 MED ORDER — TRANEXAMIC ACID-NACL 1000-0.7 MG/100ML-% IV SOLN: 1000.0000 mg | Freq: Once | INTRAVENOUS | Status: AC

## 2021-07-23 MED ORDER — CHLORHEXIDINE GLUCONATE 4 % EX LIQD
60.0000 mL | Freq: Once | CUTANEOUS | Status: AC
  Administered 2021-07-23: 4 via TOPICAL

## 2021-07-23 MED ORDER — GABAPENTIN 300 MG PO CAPS
ORAL_CAPSULE | ORAL | Status: AC
  Administered 2021-07-23: 300 mg via ORAL
  Filled 2021-07-23: qty 1

## 2021-07-23 MED ORDER — SODIUM CHLORIDE (PF) 0.9 % IJ SOLN
INTRAMUSCULAR | Status: DC | PRN
  Administered 2021-07-23: 120 mL via INTRAMUSCULAR

## 2021-07-23 MED ORDER — ACETAMINOPHEN 325 MG PO TABS: 325.0000 mg | ORAL_TABLET | Freq: Four times a day (QID) | ORAL | Status: DC | PRN

## 2021-07-23 MED ORDER — DEXAMETHASONE SODIUM PHOSPHATE 10 MG/ML IJ SOLN: 8.0000 mg | Freq: Once | INTRAMUSCULAR | Status: AC

## 2021-07-23 MED ORDER — ACETAMINOPHEN 10 MG/ML IV SOLN
1000.0000 mg | Freq: Four times a day (QID) | INTRAVENOUS | Status: DC
  Administered 2021-07-23 – 2021-07-24 (×3): 1000 mg via INTRAVENOUS
  Filled 2021-07-23 (×3): qty 100

## 2021-07-23 MED ORDER — SODIUM CHLORIDE 0.9 % IV SOLN: INTRAVENOUS | Status: DC

## 2021-07-23 MED ORDER — ACETAMINOPHEN 10 MG/ML IV SOLN
INTRAVENOUS | Status: AC
  Filled 2021-07-23: qty 100

## 2021-07-23 MED ORDER — ALUM & MAG HYDROXIDE-SIMETH 200-200-20 MG/5ML PO SUSP: 30.0000 mL | ORAL | Status: DC | PRN

## 2021-07-23 MED ORDER — CHLORHEXIDINE GLUCONATE 0.12 % MT SOLN
15.0000 mL | Freq: Once | OROMUCOSAL | Status: AC
Start: 2021-07-23 — End: 2021-07-23

## 2021-07-23 MED ORDER — FERROUS SULFATE 325 (65 FE) MG PO TABS
325.0000 mg | ORAL_TABLET | Freq: Two times a day (BID) | ORAL | Status: DC
  Administered 2021-07-23 – 2021-07-24 (×2): 325 mg via ORAL
  Filled 2021-07-23 (×2): qty 1

## 2021-07-23 MED ORDER — HYDROMORPHONE HCL 1 MG/ML IJ SOLN: 0.5000 mg | INTRAMUSCULAR | Status: DC | PRN

## 2021-07-23 MED ORDER — DIPHENHYDRAMINE HCL 12.5 MG/5ML PO ELIX: 12.5000 mg | ORAL_SOLUTION | ORAL | Status: DC | PRN

## 2021-07-23 MED ORDER — MENTHOL 3 MG MT LOZG: 1.0000 | LOZENGE | OROMUCOSAL | Status: DC | PRN

## 2021-07-23 MED ORDER — METOCLOPRAMIDE HCL 10 MG PO TABS
10.0000 mg | ORAL_TABLET | Freq: Three times a day (TID) | ORAL | Status: DC
  Administered 2021-07-23 – 2021-07-24 (×3): 10 mg via ORAL
  Filled 2021-07-23 (×3): qty 1

## 2021-07-23 MED ORDER — DEXMEDETOMIDINE HCL IN NACL 80 MCG/20ML IV SOLN
INTRAVENOUS | Status: AC
  Filled 2021-07-23: qty 20

## 2021-07-23 MED ORDER — 0.9 % SODIUM CHLORIDE (POUR BTL) OPTIME
TOPICAL | Status: DC | PRN
  Administered 2021-07-23: 500 mL

## 2021-07-23 MED ORDER — PROPOFOL 10 MG/ML IV BOLUS
INTRAVENOUS | Status: AC
  Filled 2021-07-23: qty 20

## 2021-07-23 MED ORDER — TRANEXAMIC ACID-NACL 1000-0.7 MG/100ML-% IV SOLN
1000.0000 mg | INTRAVENOUS | Status: AC
  Administered 2021-07-23: 1000 mg via INTRAVENOUS

## 2021-07-23 MED ORDER — FENTANYL CITRATE (PF) 100 MCG/2ML IJ SOLN: 25.0000 ug | INTRAMUSCULAR | Status: DC | PRN

## 2021-07-23 MED ORDER — ASCORBIC ACID 500 MG PO TABS
1000.0000 mg | ORAL_TABLET | Freq: Every day | ORAL | Status: DC
  Administered 2021-07-24: 1000 mg via ORAL
  Filled 2021-07-23: qty 2

## 2021-07-23 MED ORDER — ONDANSETRON HCL 4 MG/2ML IJ SOLN: 4.0000 mg | Freq: Four times a day (QID) | INTRAMUSCULAR | Status: DC | PRN

## 2021-07-23 MED ORDER — FENTANYL CITRATE (PF) 100 MCG/2ML IJ SOLN
INTRAMUSCULAR | Status: AC
  Filled 2021-07-23: qty 2

## 2021-07-23 MED ORDER — ORAL CARE MOUTH RINSE: 15.0000 mL | Freq: Once | OROMUCOSAL | Status: AC

## 2021-07-23 MED ORDER — PHENOL 1.4 % MT LIQD: 1.0000 | OROMUCOSAL | Status: DC | PRN

## 2021-07-23 MED ORDER — TRANEXAMIC ACID-NACL 1000-0.7 MG/100ML-% IV SOLN
INTRAVENOUS | Status: AC
  Administered 2021-07-23: 1000 mg via INTRAVENOUS
  Filled 2021-07-23: qty 100

## 2021-07-23 MED ORDER — OXYCODONE HCL 5 MG PO TABS: 5.0000 mg | ORAL_TABLET | Freq: Once | ORAL | Status: DC | PRN

## 2021-07-23 MED ORDER — CELECOXIB 200 MG PO CAPS
400.0000 mg | ORAL_CAPSULE | Freq: Once | ORAL | Status: AC
Start: 2021-07-23 — End: 2021-07-23

## 2021-07-23 MED ORDER — FENTANYL CITRATE (PF) 100 MCG/2ML IJ SOLN
INTRAMUSCULAR | Status: DC | PRN
  Administered 2021-07-23: 25 ug via INTRAVENOUS

## 2021-07-23 MED ORDER — GABAPENTIN 300 MG PO CAPS: 300.0000 mg | ORAL_CAPSULE | Freq: Once | ORAL | Status: AC

## 2021-07-23 MED ORDER — ONDANSETRON HCL 4 MG PO TABS: 4.0000 mg | ORAL_TABLET | Freq: Four times a day (QID) | ORAL | Status: DC | PRN

## 2021-07-23 MED ORDER — BUPIVACAINE HCL (PF) 0.5 % IJ SOLN
INTRAMUSCULAR | Status: AC
  Filled 2021-07-23: qty 10

## 2021-07-23 MED ORDER — VITAMIN D 25 MCG (1000 UNIT) PO TABS
5000.0000 [IU] | ORAL_TABLET | Freq: Every day | ORAL | Status: DC
  Administered 2021-07-24: 5000 [IU] via ORAL
  Filled 2021-07-23: qty 5

## 2021-07-23 MED ORDER — TRAMADOL HCL 50 MG PO TABS
50.0000 mg | ORAL_TABLET | ORAL | Status: DC | PRN
  Administered 2021-07-23: 50 mg via ORAL
  Filled 2021-07-23: qty 1

## 2021-07-23 MED ORDER — CEFAZOLIN SODIUM-DEXTROSE 2-4 GM/100ML-% IV SOLN
2.0000 g | INTRAVENOUS | Status: AC
  Administered 2021-07-23: 2 g via INTRAVENOUS

## 2021-07-23 MED ORDER — DEXAMETHASONE SODIUM PHOSPHATE 10 MG/ML IJ SOLN
INTRAMUSCULAR | Status: AC
  Administered 2021-07-23: 8 mg via INTRAVENOUS
  Filled 2021-07-23: qty 1

## 2021-07-23 MED ORDER — PHENYLEPHRINE HCL (PRESSORS) 10 MG/ML IV SOLN
INTRAVENOUS | Status: AC
  Filled 2021-07-23: qty 1

## 2021-07-23 MED ORDER — PHENYLEPHRINE HCL (PRESSORS) 10 MG/ML IV SOLN
INTRAVENOUS | Status: DC | PRN
  Administered 2021-07-23: 80 ug via INTRAVENOUS

## 2021-07-23 MED ORDER — CHLORHEXIDINE GLUCONATE 0.12 % MT SOLN
OROMUCOSAL | Status: AC
  Administered 2021-07-23: 15 mL via OROMUCOSAL
  Filled 2021-07-23: qty 15

## 2021-07-23 MED ORDER — DEXMEDETOMIDINE (PRECEDEX) IN NS 20 MCG/5ML (4 MCG/ML) IV SYRINGE
PREFILLED_SYRINGE | INTRAVENOUS | Status: DC | PRN
  Administered 2021-07-23: 8 ug via INTRAVENOUS

## 2021-07-23 MED ORDER — ACETAMINOPHEN 10 MG/ML IV SOLN
INTRAVENOUS | Status: DC | PRN
Start: 2021-07-23 — End: 2021-07-23
  Administered 2021-07-23: 1000 mg via INTRAVENOUS

## 2021-07-23 MED ORDER — PROPOFOL 500 MG/50ML IV EMUL
INTRAVENOUS | Status: DC | PRN
  Administered 2021-07-23: 100 ug/kg/min via INTRAVENOUS

## 2021-07-23 MED ORDER — PHENYLEPHRINE HCL-NACL 20-0.9 MG/250ML-% IV SOLN
INTRAVENOUS | Status: DC | PRN
  Administered 2021-07-23: 40 ug/min via INTRAVENOUS

## 2021-07-23 MED ORDER — OXYCODONE HCL 5 MG/5ML PO SOLN: 5.0000 mg | Freq: Once | ORAL | Status: DC | PRN

## 2021-07-23 MED ORDER — IRBESARTAN 150 MG PO TABS
300.0000 mg | ORAL_TABLET | Freq: Every day | ORAL | Status: DC
  Administered 2021-07-24: 300 mg via ORAL
  Filled 2021-07-23: qty 2

## 2021-07-23 MED ORDER — OXYCODONE HCL 5 MG PO TABS
5.0000 mg | ORAL_TABLET | ORAL | Status: DC | PRN
  Administered 2021-07-24 (×2): 5 mg via ORAL
  Filled 2021-07-23 (×2): qty 1

## 2021-07-23 MED ORDER — EPHEDRINE 5 MG/ML INJ
INTRAVENOUS | Status: AC
Start: 2021-07-23 — End: ?
  Filled 2021-07-23: qty 5

## 2021-07-23 MED ORDER — ENOXAPARIN SODIUM 30 MG/0.3ML IJ SOSY
30.0000 mg | PREFILLED_SYRINGE | Freq: Every day | INTRAMUSCULAR | Status: DC
  Administered 2021-07-24: 30 mg via SUBCUTANEOUS
  Filled 2021-07-23: qty 0.3

## 2021-07-23 MED ORDER — ATORVASTATIN CALCIUM 20 MG PO TABS
80.0000 mg | ORAL_TABLET | Freq: Every day | ORAL | Status: DC
  Administered 2021-07-23: 80 mg via ORAL
  Filled 2021-07-23: qty 4

## 2021-07-23 SURGICAL SUPPLY — 77 items
ATTUNE PSFEM LTSZ5 NARCEM KNEE (Femur) ×1 IMPLANT
ATTUNE PSRP INSR SZ5 8 KNEE (Insert) ×1 IMPLANT
BASE TIBIAL ROT PLAT SZ 5 KNEE (Knees) IMPLANT
BATTERY INSTRU NAVIGATION (MISCELLANEOUS) ×8 IMPLANT
BLADE SAW 70X12.5 (BLADE) ×2 IMPLANT
BLADE SAW 90X13X1.19 OSCILLAT (BLADE) ×2 IMPLANT
BLADE SAW 90X25X1.19 OSCILLAT (BLADE) ×2 IMPLANT
BONE CEMENT GENTAMICIN (Cement) ×4 IMPLANT
CEMENT BONE GENTAMICIN 40 (Cement) IMPLANT
COOLER POLAR GLACIER W/PUMP (MISCELLANEOUS) ×2 IMPLANT
CUFF TOURN SGL QUICK 24 (TOURNIQUET CUFF)
CUFF TOURN SGL QUICK 34 (TOURNIQUET CUFF)
CUFF TRNQT CYL 24X4X16.5-23 (TOURNIQUET CUFF) IMPLANT
CUFF TRNQT CYL 34X4.125X (TOURNIQUET CUFF) IMPLANT
DRAPE 3/4 80X56 (DRAPES) ×2 IMPLANT
DRAPE INCISE IOBAN 66X45 STRL (DRAPES) ×1 IMPLANT
DRSG DERMACEA 8X12 NADH (GAUZE/BANDAGES/DRESSINGS) ×2 IMPLANT
DRSG MEPILEX SACRM 8.7X9.8 (GAUZE/BANDAGES/DRESSINGS) ×2 IMPLANT
DRSG OPSITE POSTOP 4X14 (GAUZE/BANDAGES/DRESSINGS) ×2 IMPLANT
DRSG TEGADERM 4X4.75 (GAUZE/BANDAGES/DRESSINGS) ×2 IMPLANT
DURAPREP 26ML APPLICATOR (WOUND CARE) ×4 IMPLANT
ELECT CAUTERY BLADE 6.4 (BLADE) ×2 IMPLANT
ELECT REM PT RETURN 9FT ADLT (ELECTROSURGICAL) ×2
ELECTRODE REM PT RTRN 9FT ADLT (ELECTROSURGICAL) ×1 IMPLANT
EX-PIN ORTHOLOCK NAV 4X150 (PIN) ×4 IMPLANT
GLOVE BIOGEL PI IND STRL 8 (GLOVE) ×1 IMPLANT
GLOVE BIOGEL PI INDICATOR 8 (GLOVE) ×1
GLOVE SURG ENC TEXT LTX SZ7.5 (GLOVE) ×8 IMPLANT
GLOVE SURG UNDER POLY LF SZ7.5 (GLOVE) ×2 IMPLANT
GOWN STRL REUS W/ TWL LRG LVL3 (GOWN DISPOSABLE) ×2 IMPLANT
GOWN STRL REUS W/ TWL XL LVL3 (GOWN DISPOSABLE) ×1 IMPLANT
GOWN STRL REUS W/TWL LRG LVL3 (GOWN DISPOSABLE) ×2
GOWN STRL REUS W/TWL XL LVL3 (GOWN DISPOSABLE) ×1
HEMOVAC 400CC 10FR (MISCELLANEOUS) ×2 IMPLANT
HOLDER FOLEY CATH W/STRAP (MISCELLANEOUS) ×2 IMPLANT
HOLSTER ELECTROSUGICAL PENCIL (MISCELLANEOUS) ×1 IMPLANT
HOOD PEEL AWAY FLYTE STAYCOOL (MISCELLANEOUS) ×4 IMPLANT
IV NS IRRIG 3000ML ARTHROMATIC (IV SOLUTION) ×2 IMPLANT
KIT TURNOVER KIT A (KITS) ×2 IMPLANT
KNIFE SCULPS 14X20 (INSTRUMENTS) ×2 IMPLANT
LABEL OR SOLS (LABEL) ×1 IMPLANT
MANIFOLD NEPTUNE II (INSTRUMENTS) ×4 IMPLANT
NDL SAFETY ECLIPSE 18X1.5 (NEEDLE) ×1 IMPLANT
NDL SPNL 20GX3.5 QUINCKE YW (NEEDLE) ×2 IMPLANT
NEEDLE HYPO 18GX1.5 SHARP (NEEDLE)
NEEDLE SPNL 20GX3.5 QUINCKE YW (NEEDLE) ×4 IMPLANT
NS IRRIG 500ML POUR BTL (IV SOLUTION) ×2 IMPLANT
PACK TOTAL KNEE (MISCELLANEOUS) ×2 IMPLANT
PAD ABD DERMACEA PRESS 5X9 (GAUZE/BANDAGES/DRESSINGS) ×4 IMPLANT
PAD WRAPON POLAR KNEE (MISCELLANEOUS) ×1 IMPLANT
PATELLA MEDIAL ATTUN 35MM KNEE (Knees) ×1 IMPLANT
PENCIL SMOKE EVACUATOR COATED (MISCELLANEOUS) ×1 IMPLANT
PIN DRILL FIX HALF THREAD (BIT) ×4 IMPLANT
PIN FIXATION 1/8DIA X 3INL (PIN) ×2 IMPLANT
PULSAVAC PLUS IRRIG FAN TIP (DISPOSABLE) ×2
SOL PREP PVP 2OZ (MISCELLANEOUS) ×2
SOLUTION IRRIG SURGIPHOR (IV SOLUTION) ×2 IMPLANT
SOLUTION PREP PVP 2OZ (MISCELLANEOUS) ×1 IMPLANT
SPONGE DRAIN TRACH 4X4 STRL 2S (GAUZE/BANDAGES/DRESSINGS) ×2 IMPLANT
STAPLER SKIN PROX 35W (STAPLE) ×2 IMPLANT
STOCKINETTE IMPERV 14X48 (MISCELLANEOUS) IMPLANT
STRAP TIBIA SHORT (MISCELLANEOUS) ×2 IMPLANT
SUCTION FRAZIER HANDLE 10FR (MISCELLANEOUS) ×1
SUCTION TUBE FRAZIER 10FR DISP (MISCELLANEOUS) ×1 IMPLANT
SUT VIC AB 0 CT1 36 (SUTURE) ×4 IMPLANT
SUT VIC AB 1 CT1 36 (SUTURE) ×4 IMPLANT
SUT VIC AB 2-0 CT2 27 (SUTURE) ×2 IMPLANT
SYR 20ML LL LF (SYRINGE) ×1 IMPLANT
SYR 30ML LL (SYRINGE) ×4 IMPLANT
TIBIAL BASE ROT PLAT SZ 5 KNEE (Knees) ×2 IMPLANT
TIP FAN IRRIG PULSAVAC PLUS (DISPOSABLE) ×1 IMPLANT
TOWEL OR 17X26 4PK STRL BLUE (TOWEL DISPOSABLE) ×1 IMPLANT
TOWER CARTRIDGE SMART MIX (DISPOSABLE) ×2 IMPLANT
TRAY FOLEY MTR SLVR 16FR STAT (SET/KITS/TRAYS/PACK) ×2 IMPLANT
WATER STERILE IRR 1000ML POUR (IV SOLUTION) ×1 IMPLANT
WATER STERILE IRR 500ML POUR (IV SOLUTION) ×1 IMPLANT
WRAPON POLAR PAD KNEE (MISCELLANEOUS) ×2

## 2021-07-23 NOTE — Op Note (Signed)
OPERATIVE NOTE ? ?DATE OF SURGERY:  07/23/2021 ? ?PATIENT NAME:  Sandra Montgomery   ?DOB: 1941/02/02  ?MRN: [id number] ? ?PRE-OPERATIVE DIAGNOSIS: Degenerative arthrosis of the left knee, primary ? ?POST-OPERATIVE DIAGNOSIS:  Same ? ?PROCEDURE:  Left total knee arthroplasty using computer-assisted navigation ? ?SURGEON:  Marciano Sequin. M.D. ? ?ANESTHESIA: spinal ? ?ESTIMATED BLOOD LOSS: 50 mL ? ?FLUIDS REPLACED: 1000 mL of crystalloid ? ?TOURNIQUET TIME: 82 minutes ? ?DRAINS: 2 medium Hemovac drains ? ?SOFT TISSUE RELEASES: Anterior cruciate ligament, posterior cruciate ligament, deep medial collateral ligament, patellofemoral ligament ? ?IMPLANTS UTILIZED: DePuy Attune size 5N posterior stabilized femoral component (cemented), size 5 rotating platform tibial component (cemented), 35 mm medialized dome patella (cemented), and an 8 mm stabilized rotating platform polyethylene insert. ? ?INDICATIONS FOR SURGERY: Sandra Montgomery is a 81 y.o. year old female with a long history of progressive knee pain. X-rays demonstrated severe degenerative changes in tricompartmental fashion. The patient had not seen any significant improvement despite conservative nonsurgical intervention. After discussion of the risks and benefits of surgical intervention, the patient expressed understanding of the risks benefits and agree with plans for total knee arthroplasty.  ? ?The risks, benefits, and alternatives were discussed at length including but not limited to the risks of infection, bleeding, nerve injury, stiffness, blood clots, the need for revision surgery, cardiopulmonary complications, among others, and they were willing to proceed. ? ?PROCEDURE IN DETAIL: The patient was brought into the operating room and, after adequate spinal anesthesia was achieved, a tourniquet was placed on the patient's upper thigh. The patient's knee and leg were cleaned and prepped with alcohol and DuraPrep and draped in the usual sterile fashion.  A "timeout" was performed as per usual protocol. The lower extremity was exsanguinated using an Esmarch, and the tourniquet was inflated to 300 mmHg. An anterior longitudinal incision was made followed by a standard mid vastus approach. The deep fibers of the medial collateral ligament were elevated in a subperiosteal fashion off of the medial flare of the tibia so as to maintain a continuous soft tissue sleeve. The patella was subluxed laterally and the patellofemoral ligament was incised. Inspection of the knee demonstrated severe degenerative changes with full-thickness loss of articular cartilage. Osteophytes were debrided using a rongeur. Anterior and posterior cruciate ligaments were excised. Two 4.0 mm Schanz pins were inserted in the femur and into the tibia for attachment of the array of trackers used for computer-assisted navigation. Hip center was identified using a circumduction technique. Distal landmarks were mapped using the computer. The distal femur and proximal tibia were mapped using the computer. The distal femoral cutting guide was positioned using computer-assisted navigation so as to achieve a 5? distal valgus cut. The femur was sized and it was felt that a size 5N femoral component was appropriate. A size 5 femoral cutting guide was positioned and the anterior cut was performed and verified using the computer. This was followed by completion of the posterior and chamfer cuts. Femoral cutting guide for the central box was then positioned in the center box cut was performed. ? ?Attention was then directed to the proximal tibia. Medial and lateral menisci were excised. The extramedullary tibial cutting guide was positioned using computer-assisted navigation so as to achieve a 0? varus-valgus alignment and 3? posterior slope. The cut was performed and verified using the computer. The proximal tibia was sized and it was felt that a size 5 tibial tray was appropriate. Tibial and femoral trials were  inserted followed  by insertion of an 8 mm polyethylene insert. This allowed for excellent mediolateral soft tissue balancing both in flexion and in full extension. Finally, the patella was cut and prepared so as to accommodate a 35 mm medialized dome patella. A patella trial was placed and the knee was placed through a range of motion with excellent patellar tracking appreciated. The femoral trial was removed after debridement of posterior osteophytes. The central post-hole for the tibial component was reamed followed by insertion of a keel punch. Tibial trials were then removed. Cut surfaces of bone were irrigated with copious amounts of normal saline using pulsatile lavage and then suctioned dry. Polymethylmethacrylate cement with gentamicin was prepared in the usual fashion using a vacuum mixer. Cement was applied to the cut surface of the proximal tibia as well as along the undersurface of a size 5 rotating platform tibial component. Tibial component was positioned and impacted into place. Excess cement was removed using [employer] fast streamer. Cement was then applied to the cut surfaces of the femur as well as along the posterior flanges of the size 5N femoral component. The femoral component was positioned and impacted into place. Excess cement was removed using [employer] fast streamer. An 8 mm polyethylene trial was inserted and the knee was brought into full extension with steady axial compression applied. Finally, cement was applied to the backside of a 35 mm medialized dome patella and the patellar component was positioned and patellar clamp applied. Excess cement was removed using [employer] fast streamer. After adequate curing of the cement, the tourniquet was deflated after a total tourniquet time of 82 minutes. Hemostasis was achieved using electrocautery. The knee was irrigated with copious amounts of normal saline using pulsatile lavage followed by 450 ml of Surgiphor and then suctioned dry. 20 mL of 1.3% Exparel and 60 mL  of 0.25% Marcaine in 40 mL of normal saline was injected along the posterior capsule, medial and lateral gutters, and along the arthrotomy site. An 8 mm stabilized rotating platform polyethylene insert was inserted and the knee was placed through a range of motion with excellent mediolateral soft tissue balancing appreciated and excellent patellar tracking noted. 2 medium drains were placed in the wound bed and brought out through separate stab incisions. The medial parapatellar portion of the incision was reapproximated using interrupted sutures of #1 Vicryl. Subcutaneous tissue was approximated in layers using first #0 Vicryl followed #2-0 Vicryl. The skin was approximated with skin staples. A sterile dressing was applied. ? ?The patient tolerated the procedure well and was transported to the recovery room in stable condition.   ? ?Payson Evrard P. Holley Bouche., M.D. ? ?

## 2021-07-23 NOTE — H&P (Signed)
The patient has been re-examined, and the chart reviewed, and there have been no interval changes to the documented history and physical.    The risks, benefits, and alternatives have been discussed at length. The patient expressed understanding of the risks benefits and agreed with plans for surgical intervention.  Sandra Montgomery, Jr. M.D.    

## 2021-07-23 NOTE — Transfer of Care (Signed)
Immediate Anesthesia Transfer of Care Note ? ?Patient: Sandra Montgomery ? ?Procedure(s) Performed: COMPUTER ASSISTED TOTAL KNEE ARTHROPLASTY (Left: Knee) ? ?Patient Location: PACU ? ?Anesthesia Type:Spinal ? ?Level of Consciousness: awake, alert  and oriented ? ?Airway & Oxygen Therapy: Patient Spontanous Breathing and Patient connected to face mask oxygen ? ?Post-op Assessment: Report given to RN and Post -op Vital signs reviewed and stable ? ?Post vital signs: Reviewed and stable ? ?Last Vitals:  ?Vitals Value Taken Time  ?BP    ?Temp    ?Pulse    ?Resp 14 07/23/21 1513  ?SpO2    ?Vitals shown include unvalidated device data. ? ?Last Pain:  ?Vitals:  ? 07/23/21 1008  ?TempSrc: Temporal  ?PainSc: 3   ?   ? ?  ? ?Complications: No notable events documented. ?

## 2021-07-23 NOTE — Anesthesia Procedure Notes (Addendum)
Spinal ? ?Patient location during procedure: OR ?Start time: 07/23/2021 11:55 AM ?End time: 07/23/2021 12:01 PM ?Reason for block: surgical anesthesia ?Staffing ?Performed: resident/CRNA  ?Anesthesiologist: [facility], Jane Canary, MD ?Resident/CRNA: Fredderick Phenix, CRNA ?Preanesthetic Checklist ?Completed: patient identified, IV checked, site marked, risks and benefits discussed, surgical consent, monitors and equipment checked, pre-op evaluation and timeout performed ?Spinal Block ?Patient position: sitting ?Prep: DuraPrep ?Patient monitoring: heart rate, cardiac monitor, continuous pulse ox and blood pressure ?Approach: midline ?Location: L3-4 ?Injection technique: single-shot ?Needle ?Needle type: Sprotte  ?Needle gauge: 24 G ?Needle length: 9 cm ?Assessment ?Sensory level: T4 ?Events: CSF return ? ? ? ?

## 2021-07-23 NOTE — Plan of Care (Signed)

## 2021-07-23 NOTE — Progress Notes (Signed)
PT Cancellation Note ? ?Patient Details ?Name: Sandra Montgomery ?MRN: [id number] ?DOB: [birth year] ? ? ?Cancelled Treatment:    Reason Eval/Treat Not Completed: Patient not medically ready.  PT consult received.  Chart reviewed.  Pt s/p L TKA with spinal anesthesia 4/21 (today).  Pt currently does not have adequate LE strength and sensation return to safely participate in therapy activities.  Will re-attempt PT evaluation tomorrow. ? ?Leitha Bleak, PT ?07/23/21, 4:37 PM ? ?

## 2021-07-23 NOTE — Anesthesia Preprocedure Evaluation (Addendum)
Anesthesia Evaluation  ?Patient identified by MRN, date of birth, ID band ?Patient awake ? ? ? ?Reviewed: ?Allergy & Precautions, NPO status , Patient's Chart, lab work & pertinent test results ? ?Airway ?Mallampati: II ? ?TM Distance: >3 FB ?Neck ROM: full ? ? ? Dental ? ?(+) Teeth Intact,  ?  ?Pulmonary ?neg pulmonary ROS,  ?  ?Pulmonary exam normal ?breath sounds clear to auscultation ? ? ? ? ? ? Cardiovascular ?Exercise Tolerance: Good ?hypertension, Pt. on medications ?+ CAD  ?negative cardio ROS ?Normal cardiovascular exam ?Rhythm:Regular Rate:Normal ? ? ?  ?Neuro/Psych ?negative neurological ROS ? negative psych ROS  ? GI/Hepatic ?negative GI ROS, Neg liver ROS, GERD  ,  ?Endo/Other  ?negative endocrine ROSdiabetes, Type 2Hypothyroidism  ? Renal/GU ?  ?negative genitourinary ?  ?Musculoskeletal ? ?(+) Arthritis ,  ? Abdominal ?(+) + obese,   ?Peds ?negative pediatric ROS ?(+)  Hematology ?negative hematology ROS ?(+) Blood dyscrasia, anemia ,   ?Anesthesia Other Findings ?Past Medical History: ?No date: Allergic rhinitis, cause unspecified ?No date: Cancer [facility]) ?    Comment:  shoulder carcinoma right shoulder removed by  ?             dermatologist ?No date: GERD (gastroesophageal reflux disease) ?No date: Heart murmur ?No date: History of blood transfusion ?No date: Hyperlipidemia ?No date: Hypertension ?No date: Impaired fasting glucose ?No date: Osteoarthrosis involving, or with mention of more than one  ?site, but not specified as generalized, multiple sites ?No date: Osteoporosis ?1965: Pneumothorax ?    Comment:  prolonged bed rest  ?No date: Seasonal allergies ?    Comment:  fall and spring ?05/20/2021: Type 2 diabetes mellitus with diabetic chronic kidney  ?disease ([place]) ?No date: Unspecified hypothyroidism ? ?Past Surgical History: ?1981: ABDOMINAL HYSTERECTOMY ?years ago: BREAST EXCISIONAL BIOPSY; Left ?    Comment:  x 2. Negative.  ?1985: BREAST SURGERY ?     Comment:  non cancerous mass removed ?04/26/2017: CATARACT EXTRACTION W/PHACO; Left ?    Comment:  Procedure: CATARACT EXTRACTION PHACO AND INTRAOCULAR  ?             LENS PLACEMENT ([facility])  LEFT;  Surgeon: Wallace Going,  ?             Nila Nephew, MD;  Location: [place];  Service:  ?             Ophthalmology;  Laterality: Left;  Diabetic - oral meds ?05/24/2017: CATARACT EXTRACTION W/PHACO; Right ?    Comment:  Procedure: CATARACT EXTRACTION PHACO AND INTRAOCULAR  ?             LENS PLACEMENT ([place]) RIGHT DIABETIC;  Surgeon:  ?             Leandrew Koyanagi, MD;  Location: [place]; ?             Service: Ophthalmology;  Laterality: Right;  Diabetic -  ?             oral meds ?No date: COLONOSCOPY ?07/18/2016: COLONOSCOPY WITH PROPOFOL; N/A ?    Comment:  Procedure: COLONOSCOPY WITH PROPOFOL;  Surgeon: Herbie Baltimore T ?             Vira Agar, MD;  Location: [place];  Service:  ?             Endoscopy;  Laterality: N/A; ?04/18/2019: COLONOSCOPY WITH PROPOFOL; N/A ?    Comment:  Procedure: COLONOSCOPY WITH PROPOFOL;  Surgeon: [place],  ?  Benay Pike, MD;  Location: [facility] ENDOSCOPY;  Service:  ?             Gastroenterology;  Laterality: N/A; ?No date: ESOPHAGOGASTRODUODENOSCOPY ?04/18/2019: ESOPHAGOGASTRODUODENOSCOPY (EGD) WITH PROPOFOL; N/A ?    Comment:  Procedure: ESOPHAGOGASTRODUODENOSCOPY (EGD) WITH  ?             PROPOFOL;  Surgeon: [name], Benay Pike, MD;  Location:  ?             [facility] ENDOSCOPY;  Service: Gastroenterology;  Laterality:  ?             N/A; ?1961: TONSILLECTOMY AND ADENOIDECTOMY ? ?BMI   ? Body Mass Index: 28.56 kg/m?  ?  ? ? Reproductive/Obstetrics ?negative OB ROS ? ?  ? ? ? ? ? ? ? ? ? ? ? ? ? ?  ?  ? ? ? ? ? ? ? ? ?Anesthesia Physical ?Anesthesia Plan ? ?ASA: 3 ? ?Anesthesia Plan: Spinal  ? ?Post-op Pain Management:   ? ?Induction:  ? ?PONV Risk Score and Plan:  ? ?Airway Management Planned: Natural Airway and Nasal Cannula ? ?Additional Equipment:  ? ?Intra-op Plan:   ? ?Post-operative Plan:  ? ?Informed Consent: I have reviewed the patients History and Physical, chart, labs and discussed the procedure including the risks, benefits and alternatives for the proposed anesthesia with the patient or authorized representative who has indicated his/her understanding and acceptance.  ? ? ? ?Dental Advisory Given ? ?Plan Discussed with: CRNA and Surgeon ? ?Anesthesia Plan Comments:   ? ? ? ? ? ? ?Anesthesia Quick Evaluation ? ?

## 2021-07-24 DIAGNOSIS — E119 Type 2 diabetes mellitus without complications: Secondary | ICD-10-CM | POA: Diagnosis not present

## 2021-07-24 DIAGNOSIS — M1712 Unilateral primary osteoarthritis, left knee: Secondary | ICD-10-CM | POA: Diagnosis not present

## 2021-07-24 DIAGNOSIS — Z91040 Latex allergy status: Secondary | ICD-10-CM | POA: Diagnosis not present

## 2021-07-24 DIAGNOSIS — Z79899 Other long term (current) drug therapy: Secondary | ICD-10-CM | POA: Diagnosis not present

## 2021-07-24 LAB — GLUCOSE, CAPILLARY
Glucose-Capillary: 93 mg/dL (ref 70–99)
Glucose-Capillary: 119 mg/dL — ABNORMAL HIGH (ref 70–99)

## 2021-07-24 MED ORDER — ENOXAPARIN SODIUM 40 MG/0.4ML IJ SOSY: 40.0000 mg | PREFILLED_SYRINGE | INTRAMUSCULAR | 0 refills | Status: DC

## 2021-07-24 MED ORDER — TRAMADOL HCL 50 MG PO TABS: 50.0000 mg | ORAL_TABLET | ORAL | 0 refills | Status: DC | PRN

## 2021-07-24 MED ORDER — OXYCODONE HCL 5 MG PO TABS: 5.0000 mg | ORAL_TABLET | ORAL | 0 refills | Status: DC | PRN

## 2021-07-24 MED ORDER — CELECOXIB 200 MG PO CAPS
200.0000 mg | ORAL_CAPSULE | Freq: Two times a day (BID) | ORAL | 1 refills | Status: DC
Start: 2021-07-24 — End: 2022-04-08
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
## 2021-07-24 NOTE — Progress Notes (Signed)
?  Subjective: ?1 Day Post-Op Procedure(s) (LRB): ?COMPUTER ASSISTED TOTAL KNEE ARTHROPLASTY (Left) ?Patient reports pain as mild.   ?Patient is well, and has had no acute complaints or problems ?Plan is to go Home after hospital stay. ?Negative for chest pain and shortness of breath ?Fever: no ?Gastrointestinal: Negative for nausea and vomiting ? ?Objective: ?Vital signs in last 24 hours: ?Temp:  [97 ?F (36.1 ?C)-98.2 ?F (36.8 ?C)] 98.2 ?F (36.8 ?C) (04/22 0320) ?Pulse Rate:  [62-72] 62 (04/22 0320) ?Resp:  [14-18] 18 (04/22 0320) ?BP: (99-137)/(57-71) 105/59 (04/22 0320) ?SpO2:  [92 %-100 %] 97 % (04/22 0320) ?Weight:  [76.7 kg] 76.7 kg (04/21 1008) ? ?Intake/Output from previous day: ? ?Intake/Output Summary (Last 24 hours) at 07/24/2021 0721 ?Last data filed at 07/24/2021 0551 ?Gross per 24 hour  ?Intake 2246.03 ml  ?Output 725 ml  ?Net 1521.03 ml  ?  ?Intake/Output this shift: ?No intake/output data recorded. ? ?Labs: ?Recent Labs  ?  07/23/21 ?1027  ?HGB 12.6  ? ?Recent Labs  ?  07/23/21 ?1027  ?HCT 37.0  ? ?Recent Labs  ?  07/23/21 ?1027  ?NA 137  ?K 4.2  ?CL 105  ?BUN 24*  ?CREATININE 1.70*  ?GLUCOSE 118*  ? ?No results for input(s): LABPT, INR in the last 72 hours. ? ? ?EXAM ?General - Patient is Alert and Oriented ?Extremity - Neurovascular intact ?Sensation intact distally ?Dorsiflexion/Plantar flexion intact ?Compartment soft ?Dressing/Incision - clean, dry, with a Hemovac tubing intact.  The Hemovac was removed without complication.  The Hemovac tubing was intact on removal. ?Motor Function - intact, moving foot and toes well on exam.  Able to do a straight leg raise independently. ? ?Past Medical History:  ?Diagnosis Date  ? Allergic rhinitis, cause unspecified   ? Cancer [facility])   ? 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 GERD (gastroesophageal reflux disease)   ? Heart murmur   ? History of blood transfusion   ? Hyperlipidemia   ? Hypertension   ? Impaired fasting glucose   ?  Osteoarthrosis involving, or with mention of more than one site, but not specified as generalized, multiple sites   ? Osteoporosis   ? Pneumothorax 1965  ? prolonged bed rest   ? Seasonal allergies   ? fall and spring  ? Type 2 diabetes mellitus with diabetic chronic kidney disease ([place]) 05/20/2021  ? Unspecified hypothyroidism   ? ? ?Assessment/Plan: ?1 Day Post-Op Procedure(s) (LRB): ?COMPUTER ASSISTED TOTAL KNEE ARTHROPLASTY (Left) ?Principal Problem: ?  Total knee replacement status ? ?Estimated body mass index is 28.56 kg/m? as calculated from the following: ?  Height as of this encounter: 5' 4.5" (1.638 m). ?  Weight as of this encounter: 76.7 kg. ?Advance diet ?Up with therapy ?D/C IV fluids ?Discharge home with home health ? ?DVT Prophylaxis - Lovenox, Foot Pumps, and TED hose ?Weight-Bearing as tolerated to left leg ? ?Reche Dixon, PA-C ?Orthopaedic Surgery ?07/24/2021, 7:21 AM ? ?
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
## 2021-07-24 NOTE — TOC Initial Note (Addendum)
Transition of Care (TOC) - Initial/Assessment Note  ? ? ?Patient Details  ?Name: Sandra Montgomery ?MRN: [id number] ?Date of Birth: 11/16/1940 ? ?Transition of Care (TOC) CM/SW Contact:    ?Vedanth Sirico E Lochlin Eppinger, LCSW ?Phone Number: ?07/24/2021, 10:44 AM ? ?Clinical Narrative:            Patient has orders to DC home today.  ?Patient was prearranged with [facility] by surgeon's office prior to admission. ?Spoke to patient who is aware of [facility] and said they are scheduled to come to her home tomorrow. ?Notified [place] with [facility] [place]. ?Patient needs a RW and 3in1. RW and 3in1 ordered through [facility] with [facility]. Should be delivered to bedside within 2 hours, Jasmine aware of DC today. RN notified.  ?Patient stated her daughter will take her home this afternoon. ?No additional TOC needs.      ? ?2:23- Notified by charge RN that RW and 3in1 still have not been delivered. Called Jasmine with [facility]. She is checking and making sure it is here as soon as possible.  ? ?3:40- Notified by charge RN that they delivered the [place] and not the RW. Called Jasmine with [facility] and asked for RW to be delivered asap.  ? ? ?Expected Discharge Plan: [place] ?Barriers to Discharge: Barriers Resolved ? ? ?Patient Goals and CMS Choice ?Patient states their goals for this hospitalization and ongoing recovery are:: home with home health ?CMS Medicare.gov Compare Post Acute Care list provided to:: Patient ?Choice offered to / list presented to : Patient ? ?Expected Discharge Plan and Services ?Expected Discharge Plan: [place] ?  ?  ?  ?Living arrangements for the past 2 months: [place] ?Expected Discharge Date: 07/24/21               ?DME Arranged: Walker rolling, 3-N-1 ?DME Agency: [facility] ?Date DME Agency Contacted: 07/24/21 ?  ?Representative spoke with at DME Agency: Delana Meyer ?HH Arranged: PT ?[place] Agency: [facility] ?Date HH Agency Contacted: 07/24/21 ?   ?Representative spoke with at [place]: [place] ? ?Prior Living Arrangements/Services ?Living arrangements for the past 2 months: [place] ?  ?Patient language and need for interpreter reviewed:: Yes ?Do you feel safe going back to the place where you live?: Yes      ?Need for Family Participation in Patient Care: Yes (Comment) ?Care giver support system in place?: Yes (comment) ?  ?Criminal Activity/Legal Involvement Pertinent to Current Situation/Hospitalization: No - Comment as needed ? ?Activities of Daily Living ?  ?  ? ?Permission Sought/Granted ?Permission sought to share information with : Customer service manager ?Permission granted to share information with : Yes, Verbal Permission Granted ?   ? Permission granted to share info w AGENCY: [facility], DME companies ?   ?   ? ?Emotional Assessment ?  ?  ?  ?Orientation: : Oriented to Self, Oriented to Place, Oriented to  Time, Oriented to Situation ?Alcohol / Substance Use: Not Applicable ?Psych Involvement: No (comment) ? ?Admission diagnosis:  Total knee replacement status [Z96.659] ?Patient Active Problem List  ? Diagnosis Date Noted  ? Total knee replacement status 07/23/2021  ? Primary osteoarthritis of left knee 06/06/2021  ? Type 2 diabetes mellitus with diabetic chronic kidney disease ([place]) 05/20/2021  ? Proteinuria, unspecified 05/20/2021  ? Anemia in chronic kidney disease 05/20/2021  ? Iron deficiency anemia 05/27/2019  ? Acquired trigger finger 04/08/2019  ? Diastasis recti 04/08/2019  ?  Disorder characterized by back pain 04/08/2019  ? Simple obesity 04/08/2019  ? Microcytic anemia 01/18/2019  ? Actinic keratosis 05/03/2018  ? Stage 3b chronic kidney disease ([place]) 03/29/2018  ? Advanced directives, counseling/discussion 02/06/2014  ? Routine general medical examination at a health care facility 07/27/2012  ? Osteoporosis   ? Hypertension   ? Hyperlipidemia   ? Allergic rhinitis, cause unspecified   ? Hypothyroidism   ?  Prediabetes   ? Osteoarthritis, multiple sites   ? GERD (gastroesophageal reflux disease)   ? ?PCP:  Venia Carbon, MD ?Pharmacy:   ?[facility] [place] ?[address] ?[place] [address] ?Phone: [phone] Fax: [phone] ? ?[id number] [address] ?[place] ?[place] [address] ?Phone: [phone] Fax: [phone] ? ? ? ? ?Social Determinants of Health (SDOH) Interventions ?  ? ?Readmission Risk Interventions ?   ? View : No data to display.  ?  ?  ?  ? ? ? ?

## 2021-07-24 NOTE — Plan of Care (Signed)
Patient discharged home per MD orders at this time.All discharge instructions,education and medications reviewed with the patient and daughter.Pt expressed understanding and ?will comply with dc instructions.follow up appointments was also communicated to the patient.no verbal c/o or any ssx of distress.Pt was discharged home with HH/PT services per order.Pt was transported home by daughter in a privately owned vehicle. ?

## 2021-07-24 NOTE — Discharge Summary (Signed)
Physician Discharge Summary  ?Subjective: ?1 Day Post-Op Procedure(s) (LRB): ?COMPUTER ASSISTED TOTAL KNEE ARTHROPLASTY (Left) ?Patient reports pain as mild.   ?Patient seen in rounds with Dr. Posey Pronto. ?Patient is well, and has had no acute complaints or problems ?Patient is ready to go home with home health physical therapy ? ?Physician Discharge Summary  ?Patient ID: ?Sandra Montgomery ?MRN: [id number] ?DOB/AGE: February 08, 1941 81 y.o. ? ?Admit date: 07/23/2021 ?Discharge date: 07/24/2021 ? ?Admission Diagnoses: ? ?Discharge Diagnoses:  ?Principal Problem: ?  Total knee replacement status ? ? ?Discharged Condition: fair ? ?Hospital Course: The patient is postop day 1 from a left total knee arthroplasty.  She is doing well since surgery.  Her pain has become manageable.  Her vitals have been stable.  She is ready to do physical therapy today and then will go home after physical therapy. ? ?Treatments: surgery:  ?Left total knee arthroplasty using computer-assisted navigation ?  ?SURGEON:  Marciano Sequin. M.D. ?  ?ANESTHESIA: spinal ?  ?ESTIMATED BLOOD LOSS: 50 mL ?  ?FLUIDS REPLACED: 1000 mL of crystalloid ?  ?TOURNIQUET TIME: 82 minutes ?  ?DRAINS: 2 medium Hemovac drains ?  ?SOFT TISSUE RELEASES: Anterior cruciate ligament, posterior cruciate ligament, deep medial collateral ligament, patellofemoral ligament ?  ?IMPLANTS UTILIZED: DePuy Attune size 5N posterior stabilized femoral component (cemented), size 5 rotating platform tibial component (cemented), 35 mm medialized dome patella (cemented), and an 8 mm stabilized rotating platform polyethylene insert. ? ?Discharge Exam: ?Blood pressure (!) 105/59, pulse 62, temperature 98.2 ?F (36.8 ?C), temperature source Oral, resp. rate 18, height 5' 4.5" (1.638 m), weight 76.7 kg, SpO2 97 %. ? ? ?Disposition: Discharge disposition: 01-Home or Self Care ? ? ? ? ? ? ? ?Allergies as of 07/24/2021   ? ?   Reactions  ? Short Ragweed Pollen Ext Cough  ? Congestion  ? Tape Rash, Other  (See Comments)  ? Peels the skin  ? ?  ? ?  ?Medication List  ?  ? ?TAKE these medications   ? ?acetaminophen 500 MG tablet ?Commonly known as: TYLENOL ?Take 1,000 mg by mouth every 6 (six) hours as needed for moderate pain. ?  ?atorvastatin 80 MG tablet ?Commonly known as: LIPITOR ?TAKE 1 TABLET DAILY ?What changed:  ?when to take this ?additional instructions ?  ?Biotin 1000 MCG Chew ?Chew 2,000 mcg by mouth daily. ?  ?celecoxib 200 MG capsule ?Commonly known as: CELEBREX ?Take 1 capsule (200 mg total) by mouth 2 (two) times daily. ?  ?CENTRUM SILVER 50+WOMEN PO ?Take 1 tablet by mouth daily. ?  ?CVS CALCIUM CHEWS PO ?Take 2 tablets by mouth daily. ?  ?CVS TRIPLE MAGNESIUM COMPLEX PO ?Take 1 capsule by mouth daily. ?  ?Cystex Urinary Pain Relief 162-162.5 MG Tabs ?Generic drug: Methenamine-Sodium Salicylate ?Take 2 tablets by mouth 3 (three) times daily as needed. ?  ?enoxaparin 40 MG/0.4ML injection ?Commonly known as: LOVENOX ?Inject 0.4 mLs (40 mg total) into the skin daily for 14 days. ?  ?fluticasone 50 MCG/ACT nasal spray ?Commonly known as: FLONASE ?Place 2 sprays into both nostrils daily as needed for allergies. ?  ?Iron 28 MG Tabs ?Take 1 tablet by mouth daily. ?  ?olmesartan 40 MG tablet ?Commonly known as: BENICAR ?TAKE 1 TABLET DAILY ?  ?omeprazole 40 MG capsule ?Commonly known as: PRILOSEC ?TAKE 1 CAPSULE DAILY ?  ?oxyCODONE 5 MG immediate release tablet ?Commonly known as: Oxy IR/ROXICODONE ?Take 1 tablet (5 mg total) by mouth every 4 (four) hours  as needed for moderate pain (pain score 4-6). ?  ?traMADol 50 MG tablet ?Commonly known as: ULTRAM ?Take 1-2 tablets (50-100 mg total) by mouth every 4 (four) hours as needed for moderate pain. ?  ?vitamin C 1000 MG tablet ?Take 1,000 mg by mouth daily. ?  ?Vitamin D3 125 MCG (5000 UT) Caps ?Take 1 capsule by mouth daily. ?  ? ?  ? ?  ?  ? ? ?  ?Durable Medical Equipment  ?(From admission, onward)  ?  ? ? ?  ? ?  Start     Ordered  ? 07/23/21 1633  DME  Walker rolling  Once       ?Question:  Patient needs a walker to treat with the following condition  Answer:  Total knee replacement status  ? 07/23/21 1632  ? 07/23/21 1633  DME Bedside commode  Once       ?Question:  Patient needs a bedside commode to treat with the following condition  Answer:  Total knee replacement status  ? 07/23/21 1632  ? ?  ?  ? ?  ? ? Follow-up Information   ? ? Tamala Julian B, PA-C Follow up on 08/06/2021.   ?Specialty: Orthopedic Surgery ?Why: at 1:15pm ?Contact information: ?[address] ?[facility] ?[place] [address] ?[phone] ? ? ?  ?  ? ? Dereck Leep, MD Follow up on 09/02/2021.   ?Specialty: Orthopedic Surgery ?Why: at 1:45pm ?Contact information: ?[address] ?[place] [address] ?[phone] ? ? ?  ?  ? ?  ?  ? ?  ? ? ?Signed: ?Jonathandavid Marlett ?07/24/2021, 7:25 AM ? ? ?Objective: ?Vital signs in last 24 hours: ?Temp:  [97 ?F (36.1 ?C)-98.2 ?F (36.8 ?C)] 98.2 ?F (36.8 ?C) (04/22 0320) ?Pulse Rate:  [62-72] 62 (04/22 0320) ?Resp:  [14-18] 18 (04/22 0320) ?BP: (99-137)/(57-71) 105/59 (04/22 0320) ?SpO2:  [92 %-100 %] 97 % (04/22 0320) ?Weight:  [76.7 kg] 76.7 kg (04/21 1008) ? ?Intake/Output from previous day: ? ?Intake/Output Summary (Last 24 hours) at 07/24/2021 0725 ?Last data filed at 07/24/2021 0551 ?Gross per 24 hour  ?Intake 2246.03 ml  ?Output 725 ml  ?Net 1521.03 ml  ?  ?Intake/Output this shift: ?No intake/output data recorded. ? ?Labs: ?Recent Labs  ?  07/23/21 ?1027  ?HGB 12.6  ? ?Recent Labs  ?  07/23/21 ?1027  ?HCT 37.0  ? ?Recent Labs  ?  07/23/21 ?1027  ?NA 137  ?K 4.2  ?CL 105  ?BUN 24*  ?CREATININE 1.70*  ?GLUCOSE 118*  ? ?No results for input(s): LABPT, INR in the last 72 hours. ? ?EXAM: ?General - Patient is Alert and Oriented ?Extremity - Neurovascular intact ?Sensation intact distally ?Dorsiflexion/Plantar flexion intact ?Compartment soft ?Incision - clean, dry, with Hemovac  removed with no complication.  The Hemovac tubing was intact on removal. ?Motor Function -plantarflexion and dorsiflexion are intact.  Able to do a straight leg raise independently. ? ?Assessment/Plan: ?1 Day Post-Op Procedure(s) (LRB): ?COMPUTER ASSISTED TOTAL KNEE ARTHROPLASTY (Left) ?Procedure(s) (LRB): ?COMPUTER ASSISTED TOTAL KNEE ARTHROPLASTY (Left) ?Past Medical History:  ?Diagnosis Date  ? Allergic rhinitis, cause unspecified   ? Cancer [facility])   ? 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 GERD (gastroesophageal reflux disease)   ? Heart murmur   ? History of blood transfusion   ? Hyperlipidemia   ? Hypertension   ? Impaired fasting glucose   ? Osteoarthrosis involving, or with  mention of more than one site, but not specified as generalized, multiple sites   ? Osteoporosis   ? Pneumothorax 1965  ? prolonged bed rest   ? Seasonal allergies   ? fall and spring  ? Type 2 diabetes mellitus with diabetic chronic kidney disease ([place]) 05/20/2021  ? Unspecified hypothyroidism   ? ?Principal Problem: ?  Total knee replacement status ? ?Estimated body mass index is 28.56 kg/m? as calculated from the following: ?  Height as of this encounter: 5' 4.5" (1.638 m). ?  Weight as of this encounter: 76.7 kg. ?Advance diet ?Up with therapy ?D/C IV fluids ?Discharge home with home health ?Diet - Regular diet ?Follow up - in 2 weeks ?Activity - WBAT ?Disposition - Home ?Condition Upon Discharge - Stable ?DVT Prophylaxis - Lovenox, Foot Pumps, and TED hose ? ?Reche Dixon, PA-C ?Orthopaedic Surgery ?07/24/2021, 7:25 AM ? ?

## 2021-07-25 DIAGNOSIS — E1122 Type 2 diabetes mellitus with diabetic chronic kidney disease: Secondary | ICD-10-CM | POA: Diagnosis not present

## 2021-07-25 DIAGNOSIS — K648 Other hemorrhoids: Secondary | ICD-10-CM | POA: Diagnosis not present

## 2021-07-25 DIAGNOSIS — K635 Polyp of colon: Secondary | ICD-10-CM | POA: Diagnosis not present

## 2021-07-25 DIAGNOSIS — E7800 Pure hypercholesterolemia, unspecified: Secondary | ICD-10-CM | POA: Diagnosis not present

## 2021-07-25 DIAGNOSIS — M159 Polyosteoarthritis, unspecified: Secondary | ICD-10-CM | POA: Diagnosis not present

## 2021-07-25 DIAGNOSIS — Z7901 Long term (current) use of anticoagulants: Secondary | ICD-10-CM | POA: Diagnosis not present

## 2021-07-25 DIAGNOSIS — M810 Age-related osteoporosis without current pathological fracture: Secondary | ICD-10-CM | POA: Diagnosis not present

## 2021-07-25 DIAGNOSIS — E039 Hypothyroidism, unspecified: Secondary | ICD-10-CM | POA: Diagnosis not present

## 2021-07-25 DIAGNOSIS — K449 Diaphragmatic hernia without obstruction or gangrene: Secondary | ICD-10-CM | POA: Diagnosis not present

## 2021-07-25 DIAGNOSIS — Z79899 Other long term (current) drug therapy: Secondary | ICD-10-CM | POA: Diagnosis not present

## 2021-07-25 DIAGNOSIS — Z471 Aftercare following joint replacement surgery: Secondary | ICD-10-CM | POA: Diagnosis not present

## 2021-07-25 DIAGNOSIS — K5790 Diverticulosis of intestine, part unspecified, without perforation or abscess without bleeding: Secondary | ICD-10-CM | POA: Diagnosis not present

## 2021-07-25 DIAGNOSIS — K219 Gastro-esophageal reflux disease without esophagitis: Secondary | ICD-10-CM | POA: Diagnosis not present

## 2021-07-25 DIAGNOSIS — N189 Chronic kidney disease, unspecified: Secondary | ICD-10-CM | POA: Diagnosis not present

## 2021-07-25 DIAGNOSIS — Z8619 Personal history of other infectious and parasitic diseases: Secondary | ICD-10-CM | POA: Diagnosis not present

## 2021-07-25 DIAGNOSIS — Z9010 Acquired absence of unspecified breast and nipple: Secondary | ICD-10-CM | POA: Diagnosis not present

## 2021-07-25 DIAGNOSIS — Z9181 History of falling: Secondary | ICD-10-CM | POA: Diagnosis not present

## 2021-07-25 DIAGNOSIS — Z85828 Personal history of other malignant neoplasm of skin: Secondary | ICD-10-CM | POA: Diagnosis not present

## 2021-07-25 DIAGNOSIS — G43909 Migraine, unspecified, not intractable, without status migrainosus: Secondary | ICD-10-CM | POA: Diagnosis not present

## 2021-07-25 DIAGNOSIS — Z96652 Presence of left artificial knee joint: Secondary | ICD-10-CM | POA: Diagnosis not present

## 2021-07-25 DIAGNOSIS — J309 Allergic rhinitis, unspecified: Secondary | ICD-10-CM | POA: Diagnosis not present

## 2021-07-25 DIAGNOSIS — I10 Essential (primary) hypertension: Secondary | ICD-10-CM | POA: Diagnosis not present

## 2021-07-25 DIAGNOSIS — Z90710 Acquired absence of both cervix and uterus: Secondary | ICD-10-CM | POA: Diagnosis not present

## 2021-07-25 NOTE — Anesthesia Postprocedure Evaluation (Signed)
Anesthesia Post Note ? ?Patient: Sandra Montgomery ? ?Procedure(s) Performed: COMPUTER ASSISTED TOTAL KNEE ARTHROPLASTY (Left: Knee) ? ?Patient location during evaluation: Nursing Unit ?Anesthesia Type: Spinal ?Level of consciousness: awake and alert ?Pain management: pain level controlled ?Vital Signs Assessment: post-procedure vital signs reviewed and stable ?Respiratory status: spontaneous breathing, nonlabored ventilation and respiratory function stable ?Cardiovascular status: blood pressure returned to baseline and stable ?Postop Assessment: no apparent nausea or vomiting ?Anesthetic complications: no ? ? ?No notable events documented. ? ? ?Last Vitals:  ?Vitals:  ? 07/24/21 0320 07/24/21 0800  ?BP: (!) 105/59 (!) 118/55  ?Pulse: 62 (!) 59  ?Resp: 18 16  ?Temp: 36.8 ?C 36.6 ?C  ?SpO2: 97% 97%  ?  ?Last Pain:  ?Vitals:  ? 07/24/21 0800  ?TempSrc:   ?PainSc: 0-No pain  ? ? ?  ?  ?  ?  ?  ?  ? ?[place] Ouch ? ? ? ? ?

## 2021-07-26 ENCOUNTER — Encounter: Payer: Self-pay | Admitting: Orthopedic Surgery

## 2021-07-27 DIAGNOSIS — Z96652 Presence of left artificial knee joint: Secondary | ICD-10-CM | POA: Diagnosis not present

## 2021-07-27 DIAGNOSIS — N189 Chronic kidney disease, unspecified: Secondary | ICD-10-CM | POA: Diagnosis not present

## 2021-07-27 DIAGNOSIS — M159 Polyosteoarthritis, unspecified: Secondary | ICD-10-CM | POA: Diagnosis not present

## 2021-07-27 DIAGNOSIS — Z471 Aftercare following joint replacement surgery: Secondary | ICD-10-CM | POA: Diagnosis not present

## 2021-07-27 DIAGNOSIS — E1122 Type 2 diabetes mellitus with diabetic chronic kidney disease: Secondary | ICD-10-CM | POA: Diagnosis not present

## 2021-07-27 DIAGNOSIS — I10 Essential (primary) hypertension: Secondary | ICD-10-CM | POA: Diagnosis not present

## 2021-07-28 DIAGNOSIS — I10 Essential (primary) hypertension: Secondary | ICD-10-CM | POA: Diagnosis not present

## 2021-07-28 DIAGNOSIS — Z471 Aftercare following joint replacement surgery: Secondary | ICD-10-CM | POA: Diagnosis not present

## 2021-07-28 DIAGNOSIS — E1122 Type 2 diabetes mellitus with diabetic chronic kidney disease: Secondary | ICD-10-CM | POA: Diagnosis not present

## 2021-07-28 DIAGNOSIS — N189 Chronic kidney disease, unspecified: Secondary | ICD-10-CM | POA: Diagnosis not present

## 2021-07-28 DIAGNOSIS — M159 Polyosteoarthritis, unspecified: Secondary | ICD-10-CM | POA: Diagnosis not present

## 2021-07-28 DIAGNOSIS — Z96652 Presence of left artificial knee joint: Secondary | ICD-10-CM | POA: Diagnosis not present

## 2021-07-30 DIAGNOSIS — M159 Polyosteoarthritis, unspecified: Secondary | ICD-10-CM | POA: Diagnosis not present

## 2021-07-30 DIAGNOSIS — E1122 Type 2 diabetes mellitus with diabetic chronic kidney disease: Secondary | ICD-10-CM | POA: Diagnosis not present

## 2021-07-30 DIAGNOSIS — I10 Essential (primary) hypertension: Secondary | ICD-10-CM | POA: Diagnosis not present

## 2021-07-30 DIAGNOSIS — Z471 Aftercare following joint replacement surgery: Secondary | ICD-10-CM | POA: Diagnosis not present

## 2021-07-30 DIAGNOSIS — N189 Chronic kidney disease, unspecified: Secondary | ICD-10-CM | POA: Diagnosis not present

## 2021-07-30 DIAGNOSIS — Z96652 Presence of left artificial knee joint: Secondary | ICD-10-CM | POA: Diagnosis not present

## 2021-08-02 DIAGNOSIS — N189 Chronic kidney disease, unspecified: Secondary | ICD-10-CM | POA: Diagnosis not present

## 2021-08-02 DIAGNOSIS — E1122 Type 2 diabetes mellitus with diabetic chronic kidney disease: Secondary | ICD-10-CM | POA: Diagnosis not present

## 2021-08-02 DIAGNOSIS — I10 Essential (primary) hypertension: Secondary | ICD-10-CM | POA: Diagnosis not present

## 2021-08-02 DIAGNOSIS — M159 Polyosteoarthritis, unspecified: Secondary | ICD-10-CM | POA: Diagnosis not present

## 2021-08-02 DIAGNOSIS — Z471 Aftercare following joint replacement surgery: Secondary | ICD-10-CM | POA: Diagnosis not present

## 2021-08-02 DIAGNOSIS — Z96652 Presence of left artificial knee joint: Secondary | ICD-10-CM | POA: Diagnosis not present

## 2021-08-03 DIAGNOSIS — N189 Chronic kidney disease, unspecified: Secondary | ICD-10-CM | POA: Diagnosis not present

## 2021-08-03 DIAGNOSIS — I10 Essential (primary) hypertension: Secondary | ICD-10-CM | POA: Diagnosis not present

## 2021-08-03 DIAGNOSIS — Z471 Aftercare following joint replacement surgery: Secondary | ICD-10-CM | POA: Diagnosis not present

## 2021-08-03 DIAGNOSIS — M159 Polyosteoarthritis, unspecified: Secondary | ICD-10-CM | POA: Diagnosis not present

## 2021-08-03 DIAGNOSIS — Z96652 Presence of left artificial knee joint: Secondary | ICD-10-CM | POA: Diagnosis not present

## 2021-08-03 DIAGNOSIS — E1122 Type 2 diabetes mellitus with diabetic chronic kidney disease: Secondary | ICD-10-CM | POA: Diagnosis not present

## 2021-08-05 DIAGNOSIS — Z96652 Presence of left artificial knee joint: Secondary | ICD-10-CM | POA: Diagnosis not present

## 2021-08-05 DIAGNOSIS — N189 Chronic kidney disease, unspecified: Secondary | ICD-10-CM | POA: Diagnosis not present

## 2021-08-05 DIAGNOSIS — M159 Polyosteoarthritis, unspecified: Secondary | ICD-10-CM | POA: Diagnosis not present

## 2021-08-05 DIAGNOSIS — E1122 Type 2 diabetes mellitus with diabetic chronic kidney disease: Secondary | ICD-10-CM | POA: Diagnosis not present

## 2021-08-05 DIAGNOSIS — Z471 Aftercare following joint replacement surgery: Secondary | ICD-10-CM | POA: Diagnosis not present

## 2021-08-05 DIAGNOSIS — I10 Essential (primary) hypertension: Secondary | ICD-10-CM | POA: Diagnosis not present

## 2021-08-06 DIAGNOSIS — Z96652 Presence of left artificial knee joint: Secondary | ICD-10-CM | POA: Diagnosis not present

## 2021-08-09 DIAGNOSIS — Z96652 Presence of left artificial knee joint: Secondary | ICD-10-CM | POA: Diagnosis not present

## 2021-08-12 DIAGNOSIS — Z96652 Presence of left artificial knee joint: Secondary | ICD-10-CM | POA: Diagnosis not present

## 2021-08-17 DIAGNOSIS — Z96652 Presence of left artificial knee joint: Secondary | ICD-10-CM | POA: Diagnosis not present

## 2021-08-20 ENCOUNTER — Other Ambulatory Visit: Payer: Self-pay | Admitting: Family

## 2021-08-20 DIAGNOSIS — Z96652 Presence of left artificial knee joint: Secondary | ICD-10-CM | POA: Diagnosis not present

## 2021-08-25 DIAGNOSIS — Z96652 Presence of left artificial knee joint: Secondary | ICD-10-CM | POA: Diagnosis not present

## 2021-08-27 DIAGNOSIS — Z96652 Presence of left artificial knee joint: Secondary | ICD-10-CM | POA: Diagnosis not present

## 2021-08-31 DIAGNOSIS — Z96652 Presence of left artificial knee joint: Secondary | ICD-10-CM | POA: Diagnosis not present

## 2021-08-31 DIAGNOSIS — M25662 Stiffness of left knee, not elsewhere classified: Secondary | ICD-10-CM | POA: Diagnosis not present

## 2021-09-02 DIAGNOSIS — M25662 Stiffness of left knee, not elsewhere classified: Secondary | ICD-10-CM | POA: Diagnosis not present

## 2021-09-02 DIAGNOSIS — Z96652 Presence of left artificial knee joint: Secondary | ICD-10-CM | POA: Diagnosis not present

## 2021-09-07 DIAGNOSIS — Z96652 Presence of left artificial knee joint: Secondary | ICD-10-CM | POA: Diagnosis not present

## 2022-01-20 DIAGNOSIS — I10 Essential (primary) hypertension: Secondary | ICD-10-CM | POA: Diagnosis not present

## 2022-01-20 DIAGNOSIS — E1122 Type 2 diabetes mellitus with diabetic chronic kidney disease: Secondary | ICD-10-CM | POA: Diagnosis not present

## 2022-01-20 DIAGNOSIS — N1832 Chronic kidney disease, stage 3b: Secondary | ICD-10-CM | POA: Diagnosis not present

## 2022-01-20 DIAGNOSIS — R7303 Prediabetes: Secondary | ICD-10-CM | POA: Diagnosis not present

## 2022-01-20 DIAGNOSIS — D631 Anemia in chronic kidney disease: Secondary | ICD-10-CM | POA: Diagnosis not present

## 2022-01-20 DIAGNOSIS — R809 Proteinuria, unspecified: Secondary | ICD-10-CM | POA: Diagnosis not present

## 2022-01-31 ENCOUNTER — Other Ambulatory Visit: Payer: Self-pay | Admitting: Internal Medicine

## 2022-03-18 ENCOUNTER — Emergency Department: Payer: Medicare Other

## 2022-03-18 ENCOUNTER — Emergency Department
Admission: EM | Admit: 2022-03-18 | Discharge: 2022-03-19 | Disposition: A | Discharge status: Home / Self Care | Payer: Medicare Other | Attending: Emergency Medicine | Admitting: Emergency Medicine

## 2022-03-18 DIAGNOSIS — E1122 Type 2 diabetes mellitus with diabetic chronic kidney disease: Secondary | ICD-10-CM | POA: Diagnosis not present

## 2022-03-18 DIAGNOSIS — I129 Hypertensive chronic kidney disease with stage 1 through stage 4 chronic kidney disease, or unspecified chronic kidney disease: Secondary | ICD-10-CM | POA: Diagnosis not present

## 2022-03-18 DIAGNOSIS — W19XXXA Unspecified fall, initial encounter: Secondary | ICD-10-CM | POA: Insufficient documentation

## 2022-03-18 DIAGNOSIS — Y92481 Parking lot as the place of occurrence of the external cause: Secondary | ICD-10-CM | POA: Insufficient documentation

## 2022-03-18 DIAGNOSIS — I739 Peripheral vascular disease, unspecified: Secondary | ICD-10-CM | POA: Diagnosis not present

## 2022-03-18 DIAGNOSIS — I959 Hypotension, unspecified: Secondary | ICD-10-CM | POA: Diagnosis not present

## 2022-03-18 DIAGNOSIS — N189 Chronic kidney disease, unspecified: Secondary | ICD-10-CM | POA: Diagnosis not present

## 2022-03-18 DIAGNOSIS — S0181XA Laceration without foreign body of other part of head, initial encounter: Secondary | ICD-10-CM | POA: Diagnosis not present

## 2022-03-18 DIAGNOSIS — G319 Degenerative disease of nervous system, unspecified: Secondary | ICD-10-CM | POA: Diagnosis not present

## 2022-03-18 DIAGNOSIS — S065X0A Traumatic subdural hemorrhage without loss of consciousness, initial encounter: Secondary | ICD-10-CM | POA: Diagnosis not present

## 2022-03-18 DIAGNOSIS — S0990XA Unspecified injury of head, initial encounter: Secondary | ICD-10-CM | POA: Diagnosis present

## 2022-03-18 DIAGNOSIS — I10 Essential (primary) hypertension: Secondary | ICD-10-CM | POA: Diagnosis not present

## 2022-03-18 DIAGNOSIS — R110 Nausea: Secondary | ICD-10-CM | POA: Diagnosis not present

## 2022-03-18 DIAGNOSIS — S065XAA Traumatic subdural hemorrhage with loss of consciousness status unknown, initial encounter: Secondary | ICD-10-CM

## 2022-03-18 DIAGNOSIS — Z043 Encounter for examination and observation following other accident: Secondary | ICD-10-CM | POA: Diagnosis not present

## 2022-03-18 LAB — CBC WITH DIFFERENTIAL/PLATELET
Abs Immature Granulocytes: 0.05 10*3/uL (ref 0.00–0.07)
Basophils Absolute: 0.1 10*3/uL (ref 0.0–0.1)
Basophils Relative: 0 %
Eosinophils Absolute: 0.1 10*3/uL (ref 0.0–0.5)
Eosinophils Relative: 1 %
HCT: 37.7 % (ref 36.0–46.0)
Hemoglobin: 12.1 g/dL (ref 12.0–15.0)
Immature Granulocytes: 0 %
Lymphocytes Relative: 7 %
Lymphs Abs: 1.1 10*3/uL (ref 0.7–4.0)
MCH: 29 pg (ref 26.0–34.0)
MCHC: 32.1 g/dL (ref 30.0–36.0)
MCV: 90.4 fL (ref 80.0–100.0)
Monocytes Absolute: 0.7 10*3/uL (ref 0.1–1.0)
Monocytes Relative: 4 %
Neutro Abs: 13.5 10*3/uL — ABNORMAL HIGH (ref 1.7–7.7)
Neutrophils Relative %: 88 %
Platelets: 241 10*3/uL (ref 150–400)
RBC: 4.17 MIL/uL (ref 3.87–5.11)
RDW: 13.4 % (ref 11.5–15.5)
WBC: 15.5 10*3/uL — ABNORMAL HIGH (ref 4.0–10.5)
nRBC: 0 % (ref 0.0–0.2)

## 2022-03-18 LAB — BASIC METABOLIC PANEL
Anion gap: 9 (ref 5–15)
BUN: 25 mg/dL — ABNORMAL HIGH (ref 8–23)
CO2: 24 mmol/L (ref 22–32)
Calcium: 9.7 mg/dL (ref 8.9–10.3)
Chloride: 102 mmol/L (ref 98–111)
Creatinine, Ser: 1.03 mg/dL — ABNORMAL HIGH (ref 0.44–1.00)
GFR, Estimated: 55 mL/min — ABNORMAL LOW (ref >60)
Glucose, Bld: 131 mg/dL — ABNORMAL HIGH (ref 70–99)
Potassium: 3.8 mmol/L (ref 3.5–5.1)
Sodium: 135 mmol/L (ref 135–145)

## 2022-03-18 LAB — PROTIME-INR
INR: 1 (ref 0.8–1.2)
Prothrombin Time: 13.5 seconds (ref 11.4–15.2)

## 2022-03-18 MED ORDER — ACETAMINOPHEN 500 MG PO TABS
1000.0000 mg | ORAL_TABLET | Freq: Once | ORAL | Status: AC
  Administered 2022-03-18: 1000 mg via ORAL
  Filled 2022-03-18: qty 2

## 2022-03-18 NOTE — ED Notes (Signed)
Dark purple bruising and swelling noted to right side of face, right temple, right eye

## 2022-03-18 NOTE — ED Provider Notes (Signed)
[facility] Provider Note    Event Date/Time   First MD Initiated Contact with Patient 03/18/22 2141     (approximate)   History   Fall (Patient had a mechanical fall walking in the parking lot; Has laceration next to her RIGHT eye, no other head injuries observed; Patient is not on blood thinners and denies LOC)   HPI  Sandra Montgomery is a 81 y.o. female with history of hypertension, CKD, type 2 diabetes presents to the emergency department after mechanical, nonsyncopal fall.  She was at [place] and while walking in the parking lot and tripped on uneven pavement..  She fell into the bushes.  She struck her face on something and has a small laceration to the right face just beyond the right.  Bleeding is controlled. Also complaining of headache.      Physical Exam   Triage Vital Signs: ED Triage Vitals  Enc Vitals Group     BP 03/18/22 2021 (!) 164/77     Pulse Rate 03/18/22 2021 71     Resp 03/18/22 2021 16     Temp 03/18/22 2021 97.6 F (36.4 C)     Temp Source 03/18/22 2021 Oral     SpO2 03/18/22 2021 96 %     Weight 03/18/22 2020 156 lb (70.8 kg)     Height 03/18/22 2020 '5\' 6"'$  (1.676 m)     Head Circumference --      Peak Flow --      Pain Score --      Pain Loc --      Pain Edu? --      Excl. in [place]? --     Most recent vital signs: Vitals:   03/18/22 2021  BP: (!) 164/77  Pulse: 71  Resp: 16  Temp: 97.6 F (36.4 C)  SpO2: 96%     General: Awake, no distress.  CV:  Good peripheral perfusion.  Resp:  Normal effort.  Abd:  No distention.  Other:  2 cm laceration to the right side of the face. No focal neurological deficits.   ED Results / Procedures / Treatments   Labs (all labs ordered are listed, but only abnormal results are displayed) Labs Reviewed  CBC WITH DIFFERENTIAL/PLATELET - Abnormal; Notable for the following components:      Result Value   WBC 15.5 (*)    Neutro Abs 13.5 (*)    All other  components within normal limits  BASIC METABOLIC PANEL - Abnormal; Notable for the following components:   Glucose, Bld 131 (*)    BUN 25 (*)    Creatinine, Ser 1.03 (*)    GFR, Estimated 55 (*)    All other components within normal limits  PROTIME-INR     EKG     RADIOLOGY  CT of the head shows a small 6 mm right subdural hematoma with no midline shift.  CT of the cervical spine is negative for acute concerns.   PROCEDURES:  Critical Montgomery performed: Yes, see critical Montgomery procedure note(s)  Procedures   MEDICATIONS ORDERED IN ED: Medications  acetaminophen (TYLENOL) tablet 1,000 mg (has no administration in time range)     IMPRESSION / MDM / ASSESSMENT AND PLAN / ED COURSE  I reviewed the triage vital signs and the nursing notes.  Differential diagnosis includes, but is not limited to,   Patient's presentation is most consistent with acute presentation with potential threat to life or bodily function.  81 year old female presenting to the emergency department after mechanical, nonsyncopal fall earlier this evening.  See HPI for further details.  On exam, she has a small laceration to the right side of her face with controlled bleeding.  Plan will be to get a CT of her head and cervical spine to ensure there is no bleed or fracture.  Patient family aware and agreeable to the plan.   Clinical Course as of 03/18/22 2352  Fri Mar 18, 2022  2322 Awaiting return call from neurosurgeon regarding 26m subdural on right without shift. Labs ordered. Results discussed with the patient and family. [CT]  2327 Spoke with Dr. CJohnney Killian Repeat CT in 6 hours and discharged home if no progression of the bleed or new neurological findings.  She will be moved to C-Pod while awaiting repeat CT.  [CT]  2348 Patient moved to Room 11. Report given to Dr. WLeonides Schanz  [CT]    Clinical Course User Index [CT] Param Capri B, FNP     FINAL CLINICAL  IMPRESSION(S) / ED DIAGNOSES   Final diagnoses:  [place]oma [place]     Rx / DC Orders   ED Discharge Orders     None        Note:  This document was prepared using Dragon voice recognition software and may include unintentional dictation errors.   TVictorino Dike FNP 03/18/22 2352    Ward, KDelice Bison DO 03/19/22 [phone]

## 2022-03-18 NOTE — ED Notes (Signed)
ED Provider at bedside. 

## 2022-03-18 NOTE — ED Triage Notes (Signed)
Patient had a mechanical fall walking in the parking lot; Has laceration next to her RIGHT eye, no other head injuries observed; Patient is not on blood thinners and denies LOC

## 2022-03-18 NOTE — ED Triage Notes (Signed)
First nurse note:   BIB [facility] for mechanical fall at [place] while visiting. Reports tripped on curb and fell landing on R side of the face/temple. Small laceration, bleeding controlled. Denies LOC. No daily thinners. Initially reported nausea to EMS and was given 4 mg Zofran.   154/56 HR 77 99% RA  RR 14 CO2 40 BGL 124  18 g IV LFA placed en route

## 2022-03-19 ENCOUNTER — Emergency Department: Payer: Medicare Other

## 2022-03-19 DIAGNOSIS — S065X0A Traumatic subdural hemorrhage without loss of consciousness, initial encounter: Secondary | ICD-10-CM | POA: Diagnosis not present

## 2022-03-19 NOTE — ED Notes (Signed)
Patient able to drink fluids without difficulties and was able to ambulate from the bed to the bathroom without assistance.

## 2022-03-19 NOTE — ED Notes (Signed)
Patient's daughter, Malachy Mood, contacted and is on her way to take patient home.

## 2022-04-08 ENCOUNTER — Ambulatory Visit (INDEPENDENT_AMBULATORY_CARE_PROVIDER_SITE_OTHER): Payer: Medicare Other | Admitting: Internal Medicine

## 2022-04-08 ENCOUNTER — Encounter: Payer: Self-pay | Admitting: Internal Medicine

## 2022-04-08 VITALS — BP 118/80 | HR 64 | Temp 97.2°F | Ht 64.0 in | Wt 151.0 lb

## 2022-04-08 DIAGNOSIS — I10 Essential (primary) hypertension: Secondary | ICD-10-CM | POA: Diagnosis not present

## 2022-04-08 DIAGNOSIS — Z0000 Encounter for general adult medical examination without abnormal findings: Secondary | ICD-10-CM | POA: Diagnosis not present

## 2022-04-08 DIAGNOSIS — Z23 Encounter for immunization: Secondary | ICD-10-CM

## 2022-04-08 DIAGNOSIS — R7303 Prediabetes: Secondary | ICD-10-CM

## 2022-04-08 DIAGNOSIS — K219 Gastro-esophageal reflux disease without esophagitis: Secondary | ICD-10-CM | POA: Diagnosis not present

## 2022-04-08 DIAGNOSIS — N1832 Chronic kidney disease, stage 3b: Secondary | ICD-10-CM | POA: Diagnosis not present

## 2022-04-08 DIAGNOSIS — M810 Age-related osteoporosis without current pathological fracture: Secondary | ICD-10-CM | POA: Diagnosis not present

## 2022-04-08 NOTE — Assessment & Plan Note (Signed)
Will check DEXA and start actonel if sig abnormality

## 2022-04-08 NOTE — Assessment & Plan Note (Signed)
I have personally reviewed the Medicare Annual Wellness questionnaire and have noted 1. The patient's medical and social history 2. Their use of alcohol, tobacco or illicit drugs 3. Their current medications and supplements 4. The patient's functional ability including ADL's, fall risks, home safety risks and hearing or visual             impairment. 5. Diet and physical activities 6. Evidence for depression or mood disorders  The patients weight, height, BMI and visual acuity have been recorded in the chart I have made referrals, counseling and provided education to the patient based review of the above and I have provided the pt with a written personalized care plan for preventive services.  I have provided you with a copy of your personalized plan for preventive services. Please take the time to review along with your updated medication list.  Done with cancer screening Still works--no apparent cognitive issues Discussed exercise Flu vaccine today Recommended updated COVID vaccine Td/shingrix at pharmacy

## 2022-04-08 NOTE — Assessment & Plan Note (Signed)
Not diabetic at this point Past metformin but okay without it now Discussed limiting carbs

## 2022-04-08 NOTE — Assessment & Plan Note (Signed)
Does okay with the omeprazole 20 daily

## 2022-04-08 NOTE — Patient Instructions (Signed)
Please call to set up your bone density measurement

## 2022-04-08 NOTE — Progress Notes (Signed)
Hearing Screening - Comments:: Passed whisper test Vision Screening - Comments:: December 2023

## 2022-04-08 NOTE — Assessment & Plan Note (Signed)
BP Readings from Last 3 Encounters:  04/08/22 118/80  03/19/22 128/79  07/24/21 (!) 118/55   Controlled with olmesartan 40
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
## 2022-04-08 NOTE — Addendum Note (Signed)
Addended by: Pilar Grammes on: 04/08/2022 09:13 AM   Modules accepted: Orders

## 2022-04-08 NOTE — Assessment & Plan Note (Signed)
Last GFR improved On ARB

## 2022-05-30 NOTE — Progress Notes (Signed)
Pt has PCP Dr. Silvio Pate, whom she saw in January 2024 and with whom she has future appt. Chart review also indicates pt has ongoing nephrology and orthopedic specialty report. No SDOH needs documented as of the 05/21/22 screening event, for which pt's b/p screening results were wnl. No additional healty equity team support indicated at this time.

## 2022-07-12 ENCOUNTER — Other Ambulatory Visit: Payer: Self-pay | Admitting: Internal Medicine

## 2022-07-19 DIAGNOSIS — E1122 Type 2 diabetes mellitus with diabetic chronic kidney disease: Secondary | ICD-10-CM | POA: Diagnosis not present

## 2022-07-19 DIAGNOSIS — Z96652 Presence of left artificial knee joint: Secondary | ICD-10-CM | POA: Diagnosis not present

## 2022-08-03 ENCOUNTER — Ambulatory Visit
Admission: RE | Admit: 2022-08-03 | Discharge: 2022-08-03 | Disposition: A | Discharge status: Home / Self Care | Payer: Medicare Other | Source: Ambulatory Visit | Attending: Internal Medicine | Admitting: Internal Medicine

## 2022-08-03 ENCOUNTER — Other Ambulatory Visit: Payer: Self-pay | Admitting: Internal Medicine

## 2022-08-03 DIAGNOSIS — Z1231 Encounter for screening mammogram for malignant neoplasm of breast: Secondary | ICD-10-CM | POA: Diagnosis not present

## 2022-08-03 DIAGNOSIS — M810 Age-related osteoporosis without current pathological fracture: Secondary | ICD-10-CM | POA: Diagnosis not present

## 2022-08-03 DIAGNOSIS — Z780 Asymptomatic menopausal state: Secondary | ICD-10-CM | POA: Diagnosis not present

## 2022-08-03 DIAGNOSIS — M85851 Other specified disorders of bone density and structure, right thigh: Secondary | ICD-10-CM | POA: Diagnosis not present

## 2022-09-15 ENCOUNTER — Other Ambulatory Visit: Payer: Self-pay | Admitting: Internal Medicine

## 2022-12-10 ENCOUNTER — Other Ambulatory Visit: Payer: Self-pay | Admitting: Internal Medicine

## 2023-04-12 ENCOUNTER — Ambulatory Visit: Payer: Medicare Other | Admitting: Internal Medicine

## 2023-04-12 ENCOUNTER — Telehealth: Payer: Self-pay | Admitting: Internal Medicine

## 2023-04-12 ENCOUNTER — Encounter: Payer: Self-pay | Admitting: Internal Medicine

## 2023-04-12 VITALS — BP 138/78 | HR 78 | Temp 98.8°F | Ht 64.0 in | Wt 169.0 lb

## 2023-04-12 DIAGNOSIS — M150 Primary generalized (osteo)arthritis: Secondary | ICD-10-CM | POA: Diagnosis not present

## 2023-04-12 DIAGNOSIS — I10 Essential (primary) hypertension: Secondary | ICD-10-CM | POA: Diagnosis not present

## 2023-04-12 DIAGNOSIS — K219 Gastro-esophageal reflux disease without esophagitis: Secondary | ICD-10-CM | POA: Diagnosis not present

## 2023-04-12 DIAGNOSIS — E785 Hyperlipidemia, unspecified: Secondary | ICD-10-CM | POA: Diagnosis not present

## 2023-04-12 DIAGNOSIS — Z23 Encounter for immunization: Secondary | ICD-10-CM | POA: Diagnosis not present

## 2023-04-12 DIAGNOSIS — N1831 Chronic kidney disease, stage 3a: Secondary | ICD-10-CM

## 2023-04-12 DIAGNOSIS — E039 Hypothyroidism, unspecified: Secondary | ICD-10-CM | POA: Diagnosis not present

## 2023-04-12 DIAGNOSIS — Z0000 Encounter for general adult medical examination without abnormal findings: Secondary | ICD-10-CM

## 2023-04-12 DIAGNOSIS — R7303 Prediabetes: Secondary | ICD-10-CM | POA: Diagnosis not present

## 2023-04-12 LAB — LIPID PANEL
Cholesterol: 202 mg/dL — ABNORMAL HIGH (ref 0–200)
HDL: 55.7 mg/dL (ref >39.00)
LDL Cholesterol: 115 mg/dL — ABNORMAL HIGH (ref 0–99)
NonHDL: 146.24
Total CHOL/HDL Ratio: 4
Triglycerides: 157 mg/dL — ABNORMAL HIGH (ref 0.0–149.0)
VLDL: 31.4 mg/dL (ref 0.0–40.0)

## 2023-04-12 LAB — RENAL FUNCTION PANEL
Albumin: 4.8 g/dL (ref 3.5–5.2)
BUN: 27 mg/dL — ABNORMAL HIGH (ref 6–23)
CO2: 30 meq/L (ref 19–32)
Calcium: 10.2 mg/dL (ref 8.4–10.5)
Chloride: 101 meq/L (ref 96–112)
Creatinine, Ser: 1.29 mg/dL — ABNORMAL HIGH (ref 0.40–1.20)
GFR: 39.05 mL/min — ABNORMAL LOW (ref >60.00)
Glucose, Bld: 107 mg/dL — ABNORMAL HIGH (ref 70–99)
Phosphorus: 4.2 mg/dL (ref 2.3–4.6)
Potassium: 4.7 meq/L (ref 3.5–5.1)
Sodium: 139 meq/L (ref 135–145)

## 2023-04-12 LAB — T4, FREE: Free T4: 0.72 ng/dL (ref 0.60–1.60)

## 2023-04-12 LAB — HEPATIC FUNCTION PANEL
ALT: 18 U/L (ref 0–35)
AST: 17 U/L (ref 0–37)
Albumin: 4.8 g/dL (ref 3.5–5.2)
Alkaline Phosphatase: 112 U/L (ref 39–117)
Bilirubin, Direct: 0.1 mg/dL (ref 0.0–0.3)
Total Bilirubin: 0.6 mg/dL (ref 0.2–1.2)
Total Protein: 7.2 g/dL (ref 6.0–8.3)

## 2023-04-12 LAB — CBC
HCT: 41.2 % (ref 36.0–46.0)
Hemoglobin: 13.4 g/dL (ref 12.0–15.0)
MCHC: 32.5 g/dL (ref 30.0–36.0)
MCV: 91.7 fL (ref 78.0–100.0)
Platelets: 268 10*3/uL (ref 150.0–400.0)
RBC: 4.5 Mil/uL (ref 3.87–5.11)
RDW: 13.9 % (ref 11.5–15.5)
WBC: 8 10*3/uL (ref 4.0–10.5)

## 2023-04-12 LAB — HEMOGLOBIN A1C: Hgb A1c MFr Bld: 6.6 % — ABNORMAL HIGH (ref 4.6–6.5)

## 2023-04-12 LAB — TSH: TSH: 5.17 u[IU]/mL (ref 0.35–5.50)

## 2023-04-12 MED ORDER — OLMESARTAN MEDOXOMIL 40 MG PO TABS: 40.0000 mg | ORAL_TABLET | Freq: Every day | ORAL | 3 refills | Status: DC

## 2023-04-12 MED ORDER — ATORVASTATIN CALCIUM 80 MG PO TABS: ORAL_TABLET | ORAL | 3 refills | Status: AC

## 2023-04-12 NOTE — Assessment & Plan Note (Signed)
 Will check labs again Had been on metformin in the past

## 2023-04-12 NOTE — Assessment & Plan Note (Signed)
 I have personally reviewed the Medicare Annual Wellness questionnaire and have noted 1. The patient's medical and social history 2. Their use of alcohol, tobacco or illicit drugs 3. Their current medications and supplements 4. The patient's functional ability including ADL's, fall risks, home safety risks and hearing or visual             impairment. 5. Diet and physical activities 6. Evidence for depression or mood disorders  The patients weight, height, BMI and visual acuity have been recorded in the chart I have made referrals, counseling and provided education to the patient based review of the above and I have provided the pt with a written personalized care plan for preventive services.  I have provided you with a copy of your personalized plan for preventive services. Please take the time to review along with your updated medication list.  Done with cancer screening Discussed increased exercise Flu vaccine today Td at pharmacy Will consider COVID vaccine

## 2023-04-12 NOTE — Assessment & Plan Note (Signed)
 No problems with atrovastatin

## 2023-04-12 NOTE — Assessment & Plan Note (Signed)
Controlled with omeprazole daily.

## 2023-04-12 NOTE — Assessment & Plan Note (Signed)
Seems euthyroid without Rx °

## 2023-04-12 NOTE — Telephone Encounter (Signed)
 Patient dropped off document  written employee medical statement  , to be filled out by provider. Patient requested to send it back via Call Patient to pick up within 5-days. Document is located in providers tray at front office.Please advise at Mobile [phone] (mobile)

## 2023-04-12 NOTE — Assessment & Plan Note (Signed)
 Stable on olmesartan 40
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
## 2023-04-12 NOTE — Assessment & Plan Note (Signed)
 Discussed tylenol up to 1000 tid

## 2023-04-12 NOTE — Assessment & Plan Note (Signed)
 BP Readings from Last 3 Encounters:  04/12/23 138/78  05/21/22 124/70  04/08/22 118/80   Doing well on olmesartan 40mg  daily Will check labs

## 2023-04-12 NOTE — Addendum Note (Signed)
 Addended by: Benedict Needy on: 04/12/2023 08:54 AM   Modules accepted: Orders

## 2023-04-18 NOTE — Telephone Encounter (Signed)
 Left a message on VM for pt to call the office. I have looked at all the folders up front and do not see it. If she does not have it, can she bring another one for [place] to sign for her?

## 2023-04-18 NOTE — Telephone Encounter (Signed)
 Dr Alphonsus Sias, have you seen anything for her? It was dropped off 04-12-23.

## 2023-04-19 NOTE — Telephone Encounter (Signed)
 Spoke to pt. She has the form now.

## 2023-05-08 ENCOUNTER — Telehealth: Payer: Self-pay | Admitting: Internal Medicine

## 2023-05-08 NOTE — Telephone Encounter (Signed)
LVM for patient to callback regarding this bill.   Copied from CRM [id number]. Topic: General - Billing Inquiry >> May 08, 2023 12:57 PM Whitney O wrote: Reason for CRM: patient is calling about a message she received about a bill for a 130 dollars and is needing to speak with someone . Got this last Friday and patient says she has medicare and never had to pay and also has tricare Tried calling billing but it was a 18 minute wait . [id number] / Medicare number for her insurance [id number]

## 2023-07-18 DIAGNOSIS — E1122 Type 2 diabetes mellitus with diabetic chronic kidney disease: Secondary | ICD-10-CM | POA: Diagnosis not present

## 2023-07-18 DIAGNOSIS — Z96652 Presence of left artificial knee joint: Secondary | ICD-10-CM | POA: Diagnosis not present

## 2023-07-27 DIAGNOSIS — M65341 Trigger finger, right ring finger: Secondary | ICD-10-CM | POA: Diagnosis not present

## 2023-07-27 DIAGNOSIS — M65321 Trigger finger, right index finger: Secondary | ICD-10-CM | POA: Diagnosis not present

## 2024-03-07 ENCOUNTER — Telehealth: Payer: Self-pay

## 2024-03-07 NOTE — Telephone Encounter (Signed)
 Looks like she already has an appointment scheduled with me in February 2026

## 2024-03-07 NOTE — Telephone Encounter (Signed)
 Copied from CRM #[id number]. Topic: Appointments - Transfer of Care >> Mar 07, 2024  8:58 AM Berneda FALCON wrote: Pt is requesting to transfer FROM: [place] Pt is requesting to transfer TO: Wendee Reason for requested transfer: Patient wanted to see Adina Wendee It is the responsibility of the team the patient would like to transfer to (Dr. Wendee) to reach out to the patient if for any reason this transfer is not acceptable.

## 2024-04-16 ENCOUNTER — Encounter

## 2024-04-22 ENCOUNTER — Telehealth: Payer: Self-pay

## 2024-04-22 MED ORDER — OLMESARTAN MEDOXOMIL 40 MG PO TABS: 40.0000 mg | ORAL_TABLET | Freq: Every day | ORAL | 0 refills | Status: AC

## 2024-04-22 NOTE — Addendum Note (Signed)
 Addended by: WENDEE LYNWOOD HERO on: 04/22/2024 11:47 AM   Modules accepted: Orders

## 2024-04-22 NOTE — Telephone Encounter (Signed)
 LAST APPOINTMENT DATE: 04/12/23 Dr. Jimmy     NEXT APPOINTMENT DATE: 05/21/2024 [facility]  Olmesartan  medoxomil   LAST REFILL: 04/12/23  QTY: #90, 3RF

## 2024-05-21 ENCOUNTER — Encounter: Admitting: Nurse Practitioner
